# Patient Record
Sex: Female | Born: 1935 | Race: White | Hispanic: No | Marital: Married | Smoking: Never smoker
Health system: Southern US, Community
[De-identification: ages and names within clinical notes are randomized; demographics above are authoritative.]

## PROBLEM LIST (undated history)

## (undated) DIAGNOSIS — E871 Hypo-osmolality and hyponatremia: Secondary | ICD-10-CM

## (undated) DIAGNOSIS — Z7901 Long term (current) use of anticoagulants: Secondary | ICD-10-CM

## (undated) DIAGNOSIS — I10 Essential (primary) hypertension: Secondary | ICD-10-CM

## (undated) DIAGNOSIS — I340 Nonrheumatic mitral (valve) insufficiency: Secondary | ICD-10-CM

## (undated) DIAGNOSIS — D689 Coagulation defect, unspecified: Secondary | ICD-10-CM

## (undated) DIAGNOSIS — I351 Nonrheumatic aortic (valve) insufficiency: Secondary | ICD-10-CM

## (undated) DIAGNOSIS — R6510 Systemic inflammatory response syndrome (SIRS) of non-infectious origin without acute organ dysfunction: Secondary | ICD-10-CM

## (undated) DIAGNOSIS — I82409 Acute embolism and thrombosis of unspecified deep veins of unspecified lower extremity: Secondary | ICD-10-CM

## (undated) DIAGNOSIS — D72829 Elevated white blood cell count, unspecified: Secondary | ICD-10-CM

## (undated) DIAGNOSIS — R011 Cardiac murmur, unspecified: Secondary | ICD-10-CM

## (undated) DIAGNOSIS — J479 Bronchiectasis, uncomplicated: Secondary | ICD-10-CM

## (undated) DIAGNOSIS — M7551 Bursitis of right shoulder: Secondary | ICD-10-CM

## (undated) HISTORY — DX: Cardiac murmur, unspecified: R01.1

## (undated) HISTORY — DX: Essential (primary) hypertension: I10

## (undated) HISTORY — DX: Long term (current) use of anticoagulants: Z79.01

## (undated) HISTORY — PX: THYROIDECTOMY, PARTIAL: SHX18

## (undated) HISTORY — DX: Elevated white blood cell count, unspecified: D72.829

## (undated) HISTORY — DX: Systemic inflammatory response syndrome (sirs) of non-infectious origin without acute organ dysfunction: R65.10

## (undated) HISTORY — DX: Hypo-osmolality and hyponatremia: E87.1

## (undated) HISTORY — PX: TONSILLECTOMY: SUR1361

---

## 2013-12-23 ENCOUNTER — Inpatient Hospital Stay (HOSPITAL_COMMUNITY)
Admission: EM | Admit: 2013-12-23 | Discharge: 2013-12-26 | DRG: 481 | Disposition: A | Discharge status: Rehabilitation Facility | Payer: Medicare Other | Attending: Family Medicine | Admitting: Family Medicine

## 2013-12-23 ENCOUNTER — Emergency Department (HOSPITAL_COMMUNITY): Payer: Medicare Other

## 2013-12-23 ENCOUNTER — Encounter (HOSPITAL_COMMUNITY): Payer: Self-pay | Admitting: Emergency Medicine

## 2013-12-23 DIAGNOSIS — D72829 Elevated white blood cell count, unspecified: Secondary | ICD-10-CM | POA: Diagnosis not present

## 2013-12-23 DIAGNOSIS — S72143A Displaced intertrochanteric fracture of unspecified femur, initial encounter for closed fracture: Principal | ICD-10-CM

## 2013-12-23 DIAGNOSIS — S7223XA Displaced subtrochanteric fracture of unspecified femur, initial encounter for closed fracture: Secondary | ICD-10-CM | POA: Diagnosis present

## 2013-12-23 DIAGNOSIS — Z79899 Other long term (current) drug therapy: Secondary | ICD-10-CM

## 2013-12-23 DIAGNOSIS — W19XXXA Unspecified fall, initial encounter: Secondary | ICD-10-CM | POA: Diagnosis present

## 2013-12-23 DIAGNOSIS — S72009A Fracture of unspecified part of neck of unspecified femur, initial encounter for closed fracture: Secondary | ICD-10-CM | POA: Diagnosis present

## 2013-12-23 DIAGNOSIS — S72142A Displaced intertrochanteric fracture of left femur, initial encounter for closed fracture: Secondary | ICD-10-CM

## 2013-12-23 DIAGNOSIS — J479 Bronchiectasis, uncomplicated: Secondary | ICD-10-CM | POA: Diagnosis present

## 2013-12-23 DIAGNOSIS — D62 Acute posthemorrhagic anemia: Secondary | ICD-10-CM | POA: Diagnosis not present

## 2013-12-23 DIAGNOSIS — I809 Phlebitis and thrombophlebitis of unspecified site: Secondary | ICD-10-CM | POA: Diagnosis present

## 2013-12-23 DIAGNOSIS — Z86718 Personal history of other venous thrombosis and embolism: Secondary | ICD-10-CM

## 2013-12-23 DIAGNOSIS — Z7901 Long term (current) use of anticoagulants: Secondary | ICD-10-CM

## 2013-12-23 DIAGNOSIS — Z885 Allergy status to narcotic agent status: Secondary | ICD-10-CM

## 2013-12-23 DIAGNOSIS — I82509 Chronic embolism and thrombosis of unspecified deep veins of unspecified lower extremity: Secondary | ICD-10-CM | POA: Diagnosis present

## 2013-12-23 DIAGNOSIS — I10 Essential (primary) hypertension: Secondary | ICD-10-CM | POA: Diagnosis present

## 2013-12-23 LAB — CBC WITH DIFFERENTIAL/PLATELET
Basophils Absolute: 0 10*3/uL (ref 0.0–0.1)
Basophils Relative: 0 % (ref 0–1)
EOS ABS: 0.1 10*3/uL (ref 0.0–0.7)
Eosinophils Relative: 1 % (ref 0–5)
HCT: 40.2 % (ref 36.0–46.0)
Hemoglobin: 13.5 g/dL (ref 12.0–15.0)
LYMPHS ABS: 3.1 10*3/uL (ref 0.7–4.0)
Lymphocytes Relative: 35 % (ref 12–46)
MCH: 29.3 pg (ref 26.0–34.0)
MCHC: 33.6 g/dL (ref 30.0–36.0)
MCV: 87.4 fL (ref 78.0–100.0)
Monocytes Absolute: 0.8 10*3/uL (ref 0.1–1.0)
Monocytes Relative: 9 % (ref 3–12)
Neutro Abs: 5 10*3/uL (ref 1.7–7.7)
Neutrophils Relative %: 56 % (ref 43–77)
Platelets: 222 10*3/uL (ref 150–400)
RBC: 4.6 MIL/uL (ref 3.87–5.11)
RDW: 14.1 % (ref 11.5–15.5)
WBC: 8.9 10*3/uL (ref 4.0–10.5)

## 2013-12-23 LAB — BASIC METABOLIC PANEL
BUN: 20 mg/dL (ref 6–23)
CALCIUM: 9.1 mg/dL (ref 8.4–10.5)
CO2: 24 meq/L (ref 19–32)
CREATININE: 0.89 mg/dL (ref 0.50–1.10)
Chloride: 98 mEq/L (ref 96–112)
GFR calc Af Amer: 69 mL/min — ABNORMAL LOW (ref >90)
GFR calc non Af Amer: 60 mL/min — ABNORMAL LOW (ref >90)
GLUCOSE: 129 mg/dL — AB (ref 70–99)
Potassium: 3.8 mEq/L (ref 3.7–5.3)
Sodium: 138 mEq/L (ref 137–147)

## 2013-12-23 LAB — URINALYSIS, ROUTINE W REFLEX MICROSCOPIC
BILIRUBIN URINE: NEGATIVE
Glucose, UA: NEGATIVE mg/dL
Hgb urine dipstick: NEGATIVE
Ketones, ur: 15 mg/dL — AB
LEUKOCYTES UA: NEGATIVE
Nitrite: NEGATIVE
Protein, ur: NEGATIVE mg/dL
Specific Gravity, Urine: 1.009 (ref 1.005–1.030)
Urobilinogen, UA: 0.2 mg/dL (ref 0.0–1.0)
pH: 7.5 (ref 5.0–8.0)

## 2013-12-23 LAB — PROTIME-INR
INR: 1.82 — ABNORMAL HIGH (ref 0.00–1.49)
PROTHROMBIN TIME: 20.5 s — AB (ref 11.6–15.2)

## 2013-12-23 LAB — ABO/RH: ABO/RH(D): O POS

## 2013-12-23 LAB — TYPE AND SCREEN
ABO/RH(D): O POS
Antibody Screen: NEGATIVE

## 2013-12-23 MED ORDER — SENNOSIDES-DOCUSATE SODIUM 8.6-50 MG PO TABS: 1.0000 | ORAL_TABLET | Freq: Every evening | ORAL | Status: DC | PRN

## 2013-12-23 MED ORDER — PROMETHAZINE HCL 25 MG/ML IJ SOLN
12.5000 mg | Freq: Once | INTRAMUSCULAR | Status: AC
  Administered 2013-12-23: 12.5 mg via INTRAVENOUS
  Filled 2013-12-23: qty 1

## 2013-12-23 MED ORDER — FENTANYL CITRATE 0.05 MG/ML IJ SOLN
50.0000 ug | INTRAMUSCULAR | Status: AC | PRN
  Administered 2013-12-23 (×2): 50 ug via INTRAVENOUS
  Filled 2013-12-23 (×2): qty 2

## 2013-12-23 MED ORDER — LISINOPRIL 5 MG PO TABS
5.0000 mg | ORAL_TABLET | Freq: Two times a day (BID) | ORAL | Status: DC
  Administered 2013-12-23 – 2013-12-24 (×2): 5 mg via ORAL
  Filled 2013-12-23 (×5): qty 1

## 2013-12-23 MED ORDER — ALUM & MAG HYDROXIDE-SIMETH 200-200-20 MG/5ML PO SUSP
30.0000 mL | Freq: Four times a day (QID) | ORAL | Status: DC | PRN
Start: 2013-12-23 — End: 2013-12-26

## 2013-12-23 MED ORDER — ONDANSETRON HCL 4 MG/2ML IJ SOLN
4.0000 mg | Freq: Once | INTRAMUSCULAR | Status: AC
  Administered 2013-12-23: 4 mg via INTRAVENOUS
  Filled 2013-12-23: qty 2

## 2013-12-23 MED ORDER — DEXTROSE-NACL 5-0.9 % IV SOLN
INTRAVENOUS | Status: AC
  Administered 2013-12-23 – 2013-12-25 (×3): via INTRAVENOUS

## 2013-12-23 MED ORDER — PHYTONADIONE 5 MG PO TABS
5.0000 mg | ORAL_TABLET | Freq: Once | ORAL | Status: AC
  Administered 2013-12-23: 5 mg via ORAL
  Filled 2013-12-23: qty 1

## 2013-12-23 MED ORDER — GUAIFENESIN-DM 100-10 MG/5ML PO SYRP: 5.0000 mL | ORAL_SOLUTION | ORAL | Status: DC | PRN

## 2013-12-23 MED ORDER — FLUTICASONE PROPIONATE 50 MCG/ACT NA SUSP
1.0000 | Freq: Every day | NASAL | Status: DC
  Administered 2013-12-24 – 2013-12-26 (×3): 1 via NASAL
  Filled 2013-12-23: qty 16

## 2013-12-23 MED ORDER — MORPHINE SULFATE 2 MG/ML IJ SOLN
2.0000 mg | INTRAMUSCULAR | Status: DC | PRN
  Administered 2013-12-24 (×2): 2 mg via INTRAVENOUS
  Filled 2013-12-23 (×2): qty 1

## 2013-12-23 MED ORDER — ADULT MULTIVITAMIN W/MINERALS CH
1.0000 | ORAL_TABLET | Freq: Every day | ORAL | Status: DC
  Administered 2013-12-25 – 2013-12-26 (×2): 1 via ORAL
  Filled 2013-12-23 (×3): qty 1

## 2013-12-23 MED ORDER — ONDANSETRON HCL 4 MG PO TABS: 4.0000 mg | ORAL_TABLET | Freq: Four times a day (QID) | ORAL | Status: DC | PRN

## 2013-12-23 MED ORDER — HYDROCODONE-ACETAMINOPHEN 5-325 MG PO TABS
1.0000 | ORAL_TABLET | ORAL | Status: DC | PRN
  Administered 2013-12-25 (×3): 1 via ORAL
  Administered 2013-12-26: 2 via ORAL
  Administered 2013-12-26 (×2): 1 via ORAL
  Filled 2013-12-23 (×5): qty 1

## 2013-12-23 MED ORDER — ACETAMINOPHEN 325 MG PO TABS: 650.0000 mg | ORAL_TABLET | Freq: Four times a day (QID) | ORAL | Status: DC | PRN

## 2013-12-23 MED ORDER — ONDANSETRON HCL 4 MG/2ML IJ SOLN: 4.0000 mg | Freq: Four times a day (QID) | INTRAMUSCULAR | Status: DC | PRN

## 2013-12-23 MED ORDER — AZELASTINE HCL 0.1 % NA SOLN
2.0000 | Freq: Two times a day (BID) | NASAL | Status: DC | PRN
  Filled 2013-12-23: qty 30

## 2013-12-23 MED ORDER — ALBUTEROL SULFATE (2.5 MG/3ML) 0.083% IN NEBU: 2.5000 mg | INHALATION_SOLUTION | RESPIRATORY_TRACT | Status: DC | PRN

## 2013-12-23 MED ORDER — CALCIUM CARBONATE 1250 (500 CA) MG PO TABS
1.0000 | ORAL_TABLET | Freq: Every day | ORAL | Status: DC
  Administered 2013-12-25 – 2013-12-26 (×2): 500 mg via ORAL
  Filled 2013-12-23 (×4): qty 1

## 2013-12-23 MED ORDER — ACETAMINOPHEN 650 MG RE SUPP: 650.0000 mg | Freq: Four times a day (QID) | RECTAL | Status: DC | PRN

## 2013-12-23 NOTE — ED Notes (Signed)
Bed: WA01 Expected date:  Expected time:  Means of arrival:  Comments: fall 

## 2013-12-23 NOTE — ED Provider Notes (Signed)
CSN: [id number]     Arrival date & time 12/23/13  1600 History   First MD Initiated Contact with Patient 12/23/13 [id number]     Chief Complaint  Patient presents with  . Fall     (Consider location/radiation/quality/duration/timing/severity/associated sxs/prior Treatment) Patient is a 80 y.o. female presenting with fall. The history is provided by the patient.  Fall This is a new problem. The current episode started less than 1 hour ago. The problem occurs constantly. The problem has not changed since onset.Pertinent negatives include no chest pain, no abdominal pain, no headaches and no shortness of breath. Associated symptoms comments: Left hip pain and deformity.  Since fall she has had n/v due to pain.  Pt denies head injury or LOC.  nO neck pain.. The symptoms are aggravated by bending and twisting. The symptoms are relieved by narcotics. Treatments tried: fentanyl. The treatment provided moderate relief.    Past Medical History  Diagnosis Date  . Hypertension    No past surgical history on file. No family history on file. History  Substance Use Topics  . Smoking status: Not on file  . Smokeless tobacco: Not on file  . Alcohol Use: Not on file   OB History   Grav Para Term Preterm Abortions TAB SAB Ect Mult Living                 Review of Systems  Respiratory: Negative for shortness of breath.   Cardiovascular: Negative for chest pain.  Gastrointestinal: Positive for nausea and vomiting. Negative for abdominal pain.  Neurological: Negative for headaches.  All other systems reviewed and are negative.     Allergies  Augmentin; Macrolides and ketolides; and Zithromax  Home Medications   Prior to Admission medications   Not on File   There were no vitals taken for this visit. Physical Exam  Nursing note and vitals reviewed. Constitutional: She is oriented to person, place, and time. She appears well-developed and well-nourished. No distress.  HENT:  Head:  Normocephalic and atraumatic.  Mouth/Throat: Oropharynx is clear and moist.  Eyes: Conjunctivae and EOM are normal. Pupils are equal, round, and reactive to light.  Neck: Normal range of motion. Neck supple.  Cardiovascular: Regular rhythm and intact distal pulses.  Tachycardia present.   No murmur heard. Pulmonary/Chest: Effort normal and breath sounds normal. No respiratory distress. She has no wheezes. She has no rales.  Abdominal: Soft. She exhibits no distension. There is no tenderness. There is no rebound and no guarding.  Musculoskeletal: She exhibits no edema.       Left hip: She exhibits decreased range of motion, tenderness, bony tenderness and deformity.  Left lower ext is rotated and shortened  Neurological: She is alert and oriented to person, place, and time.  Skin: Skin is warm and dry. No rash noted. No erythema. There is pallor.  Psychiatric: She has a normal mood and affect. Her behavior is normal.    ED Course  Procedures (including critical care time) Labs Review Labs Reviewed  BASIC METABOLIC PANEL - Abnormal; Notable for the following:    Glucose, Bld 129 (*)    GFR calc non Af Amer 60 (*)    GFR calc Af Amer 69 (*)    All other components within normal limits  PROTIME-INR - Abnormal; Notable for the following:    Prothrombin Time 20.5 (*)    INR 1.82 (*)    All other components within normal limits  CBC WITH DIFFERENTIAL  URINALYSIS, ROUTINE W  REFLEX MICROSCOPIC  TYPE AND SCREEN  ABO/RH    Imaging Review Dg Chest 1 View  12/23/2013   CLINICAL DATA:  Pre-op respiratory exam for left hip fracture.  EXAM: CHEST - 1 VIEW  COMPARISON:  None.  FINDINGS: The heart size and mediastinal contours are within normal limits. No evidence of pulmonary infiltrate or pleural effusion. Thoracic dextroscoliosis noted.  IMPRESSION: No acute findings.   Electronically Signed   By: Earle Gell M.D.   On: 12/23/2013 17:06   Dg Hip Complete Left  12/23/2013   CLINICAL DATA:   [place], pain  EXAM: LEFT HIP - COMPLETE 2+ VIEW  COMPARISON:  None.  FINDINGS: There is a comminuted intertrochanteric fracture of the left hip which extends into the proximal femur. Significant medial angulation. No hip dislocation. No pelvic fracture. Soft tissue swelling. Vascular calcification.  IMPRESSION: Comminuted intertrochanteric fracture, left hip.   Electronically Signed   By: Rolla Flatten M.D.   On: 12/23/2013 17:04     EKG Interpretation   Date/Time:  Saturday December 23 2013 17:25:43 EDT Ventricular Rate:  101 PR Interval:    QRS Duration: 82 QT Interval:  425 QTC Calculation: 551 R Axis:   4 Text Interpretation:  Atrial fibrillation Low voltage, precordial leads  Nonspecific T abnormalities, lateral leads Prolonged QT interval No  previous tracing Confirmed by Maryan Rued  MD, Loree Fee ([id number]) on 12/23/2013  5:40:31 PM      MDM   Final diagnoses:  Intertrochanteric fracture of left hip   Patient with a mechanical fall today on the left hip with deformity of the hip and inability to walk. She denies any injury to her head or LOC. She is neurovascularly intact no other injuries noted at this time. 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  Pt denies any SOB but she last ate at 13:30 and has been vomiting since fall due to the pain.  She is awake alert and oriented.   Hip fx protocol started.  5:11 PM Imaging shows comminuted intertrochanteric fx of the left hip.  InR is 1.82.  CXR and EKG are stable.  Discussed with Dr. Ninfa Linden with ortho and will admit to hospitalist.  Pt placed in buck's traction.  Will not go to OR till tomorrow.  Blanchie Dessert, MD 12/23/13 1754

## 2013-12-23 NOTE — ED Notes (Addendum)
Pt had fall hitting left hip on concrete patio, shortening rotation noted to left side. 150 mcg of fentanyl 20g right forearm and 4 mg zofran. Pt visitor from [place].

## 2013-12-23 NOTE — ED Notes (Signed)
Admitting MD at bedside.

## 2013-12-23 NOTE — Consult Note (Signed)
Reason for Consult:  Left hip fracture Referring Physician: EDP  Vanessa Page is an 80 y.o. female.  HPI:   80 yo female who injured her left hip today after a mechanical fall.  Was transported to the ED and found to have a complex left hip fracture.  Orthopedics is consulted to address this.  She denies any syncopal episode and states that she was playing catch with her grandchild when she accidentally tripped falling directly on her left hip.  She reports only left hip pain and denies other injuries.  Past Medical History  Diagnosis Date  . Hypertension     No past surgical history on file.  No family history on file.  Social History:  has no tobacco, alcohol, and drug history on file.  Allergies:  Allergies  Allergen Reactions  . Codeine     Medications: I have reviewed the patient's current medications.  Results for orders placed during the hospital encounter of 12/23/13 (from the past 48 hour(s))  BASIC METABOLIC PANEL     Status: Abnormal   Collection Time    12/23/13  4:20 PM      Result Value Ref Range   Sodium 138  137 - 147 mEq/L   Potassium 3.8  3.7 - 5.3 mEq/L   Chloride 98  96 - 112 mEq/L   CO2 24  19 - 32 mEq/L   Glucose, Bld 129 (*) 70 - 99 mg/dL   BUN 20  6 - 23 mg/dL   Creatinine, Ser 0.89  0.50 - 1.10 mg/dL   Calcium 9.1  8.4 - 10.5 mg/dL   GFR calc non Af Amer 60 (*) >90 mL/min   GFR calc Af Amer 69 (*) >90 mL/min   Comment: (NOTE)     The eGFR has been calculated using the CKD EPI equation.     This calculation has not been validated in all clinical situations.     eGFR's persistently <90 mL/min signify possible Chronic Kidney     Disease.  CBC WITH DIFFERENTIAL     Status: None   Collection Time    12/23/13  4:20 PM      Result Value Ref Range   WBC 8.9  4.0 - 10.5 K/uL   RBC 4.60  3.87 - 5.11 MIL/uL   Hemoglobin 13.5  12.0 - 15.0 g/dL   HCT 40.2  36.0 - 46.0 %   MCV 87.4  78.0 - 100.0 fL   MCH 29.3  26.0 - 34.0 pg   MCHC 33.6  30.0 -  36.0 g/dL   RDW 14.1  11.5 - 15.5 %   Platelets 222  150 - 400 K/uL   Neutrophils Relative % 56  43 - 77 %   Neutro Abs 5.0  1.7 - 7.7 K/uL   Lymphocytes Relative 35  12 - 46 %   Lymphs Abs 3.1  0.7 - 4.0 K/uL   Monocytes Relative 9  3 - 12 %   Monocytes Absolute 0.8  0.1 - 1.0 K/uL   Eosinophils Relative 1  0 - 5 %   Eosinophils Absolute 0.1  0.0 - 0.7 K/uL   Basophils Relative 0  0 - 1 %   Basophils Absolute 0.0  0.0 - 0.1 K/uL  PROTIME-INR     Status: Abnormal   Collection Time    12/23/13  4:20 PM      Result Value Ref Range   Prothrombin Time 20.5 (*) 11.6 - 15.2 seconds   INR 1.82 (*)  0.00 - 1.49  TYPE AND SCREEN     Status: None   Collection Time    12/23/13  4:20 PM      Result Value Ref Range   ABO/RH(D) O POS     Antibody Screen NEG     Sample Expiration 12/26/2013    ABO/RH     Status: None   Collection Time    12/23/13  4:20 PM      Result Value Ref Range   ABO/RH(D) O POS    URINALYSIS, ROUTINE W REFLEX MICROSCOPIC     Status: Abnormal   Collection Time    12/23/13  7:13 PM      Result Value Ref Range   Color, Urine YELLOW  YELLOW   APPearance CLOUDY (*) CLEAR   Specific Gravity, Urine 1.009  1.005 - 1.030   pH 7.5  5.0 - 8.0   Glucose, UA NEGATIVE  NEGATIVE mg/dL   Hgb urine dipstick NEGATIVE  NEGATIVE   Bilirubin Urine NEGATIVE  NEGATIVE   Ketones, ur 15 (*) NEGATIVE mg/dL   Protein, ur NEGATIVE  NEGATIVE mg/dL   Urobilinogen, UA 0.2  0.0 - 1.0 mg/dL   Nitrite NEGATIVE  NEGATIVE   Leukocytes, UA NEGATIVE  NEGATIVE   Comment: MICROSCOPIC NOT DONE ON URINES WITH NEGATIVE PROTEIN, BLOOD, LEUKOCYTES, NITRITE, OR GLUCOSE <1000 mg/dL.    Dg Chest 1 View  12/23/2013   CLINICAL DATA:  Pre-op respiratory exam for left hip fracture.  EXAM: CHEST - 1 VIEW  COMPARISON:  None.  FINDINGS: The heart size and mediastinal contours are within normal limits. No evidence of pulmonary infiltrate or pleural effusion. Thoracic dextroscoliosis noted.  IMPRESSION: No acute  findings.   Electronically Signed   By: John  Stahl M.D.   On: 12/23/2013 17:06   Dg Hip Complete Left  12/23/2013   CLINICAL DATA:  Fell, pain  EXAM: LEFT HIP - COMPLETE 2+ VIEW  COMPARISON:  None.  FINDINGS: There is a comminuted intertrochanteric fracture of the left hip which extends into the proximal femur. Significant medial angulation. No hip dislocation. No pelvic fracture. Soft tissue swelling. Vascular calcification.  IMPRESSION: Comminuted intertrochanteric fracture, left hip.   Electronically Signed   By: John  Curnes M.D.   On: 12/23/2013 17:04    Review of Systems  All other systems reviewed and are negative.  Blood pressure 139/73, pulse 104, temperature 97.7 F (36.5 C), temperature source Oral, resp. rate 16, SpO2 95.00%. Physical Exam  Musculoskeletal:       Left hip: She exhibits decreased range of motion, decreased strength, bony tenderness and deformity.    Assessment/Plan: Comminuted intertrochanteric femur fracture with subtrochanteric extension 1)  I spoke to her in length as well as her family.  They understand the need for surgery and I explained in great detail the surgical approach as well as the risks and benefits involved.  We will proceed to the OR in the am.  Christopher Y Blackman 12/23/2013, 10:38 PM      

## 2013-12-23 NOTE — H&P (Signed)
Patient Demographics  Vanessa Page, is a 80 y.o. female  MRN: [id number]   DOB - 1931-06-21  Admit Date - 12/23/2013  Outpatient Primary MD for the patient is No primary provider on file.     Assessment Vanessa Page is a pleasant 80 year old female with a history of DVT on Coumadin/bronchiectasis/essential hypertension comes in after she fell while playing with her grand kids, resulting in a left intertrochanteric fracture. She had otherwise been in good health with no history of fixational angina or dyspnea. INR is 1.82 today. She last took Coumadin yesterday. Dr. Shanon Brow from orthopedics was consulted by Dr Maryan Rued  and will see patient in consult with tentative plans for surgery in a.m. Will let Coumadin wear off but if necessary give FFP in a.m. if INR remains above 1.5. Will also obtain 2-D echocardiogram to assess ejection fraction but this should not delay these image and 3 required surgery was potential benefits outweigh the risks in a patient with moderate risk profile for an average risk procedure. Plan Intertrochanteric fracture of left hip  Admit MedSurg  Defer management to orthopedics  N.p.o. after midnight  Pain control  Will need physical therapy evaluation postoperatively Chronic anticoagulation/History of DVT of lower  extremity  Hold Coumadin  Consider FFP transfusion if INR remains above 1.5 in a.m. Bronchiectasis  Stable  Bronchodilators and oxygen supplementation for now Essential hypertension  BP borderline  Resume home medications with parameters DVT Prophylaxis    SCDs   AM Labs Ordered, also please review Full Orders  Family Communication: Admission, patients condition and plan of care including tests being ordered have been discussed with the patient and husband who indicate understanding and agree with the plan and Code Status.  Code Status Full Code  Likely DC to  ?SNF  Condition GUARDED     Chief Complaint  Patient  presents with  . Fall     HPI  Vanessa Page  is a 80 y.o. female, who comes in after she fell while playing with her grandkids. She had otherwise been in good health and just tripped while playing ball game. She is here visiting from [facility]. She denies shortness of breath, chest pain, fever, nausea, vomiting, diarrhea or dysuria.  Review of Systems    In addition to the HPI above,  No Fever-chills, No Headache, No changes with Vision or hearing, No problems swallowing food or Liquids, No Chest pain, Cough or Shortness of Breath, No Abdominal pain, No Nausea or Vommitting, Bowel movements are regular, No Blood in stool or Urine, No dysuria, No new skin rashes or bruises, No new joints pains-aches,  No new weakness, tingling, numbness in any extremity, No recent weight gain or loss, No polyuria, polydypsia or polyphagia, No significant Mental Stressors.  A full 10 point Review of Systems was done, except as stated above, all other Review of Systems were negative.   Social History History  Substance Use Topics  . Smoking status: Not on file  . Smokeless tobacco: Not on file  . Alcohol Use: Not on file    Family History Negative.  Prior to Admission medications   Medication Sig Start Date End Date Taking? Authorizing Provider  albuterol (PROVENTIL HFA;VENTOLIN HFA) 108 (90 BASE) MCG/ACT inhaler Inhale 1 puff into the lungs every 6 (six) hours as needed for wheezing or shortness of breath.   Yes Historical Provider, MD  alendronate (FOSAMAX) 70 MG tablet Take 70 mg by mouth once a week. Take  with a full glass of water on an empty stomach.   Yes Historical Provider, MD  azelastine (ASTELIN) 137 MCG/SPRAY nasal spray Place 2 sprays into both nostrils 2 (two) times daily as needed for allergies. Use in each nostril as directed   Yes Historical Provider, MD  CALCIUM CARBONATE PO Take 1 tablet by mouth daily.   Yes Historical Provider, MD  conjugated estrogens  (PREMARIN) vaginal cream Place 1 Applicatorful vaginally 3 (three) times a week.   Yes Historical Provider, MD  fluticasone (FLONASE) 50 MCG/ACT nasal spray Place into both nostrils daily.   Yes Historical Provider, MD  lisinopril (PRINIVIL,ZESTRIL) 5 MG tablet Take 5 mg by mouth 2 (two) times daily.   Yes Historical Provider, MD  warfarin (COUMADIN) 2 MG tablet Take 1-2 mg by mouth daily. Takes 1 mg(1/2 tablet) on Monday and Friday and 2 mg (1 tablet) the rest of the week.   Yes Historical Provider, MD    Allergies  Allergen Reactions  . Codeine     Physical Exam  Vitals  Blood pressure 139/73, pulse 104, temperature 97.7 F (36.5 C), temperature source Oral, resp. rate 16, SpO2 95.00%.   1. General  lying in bed in NAD,    2. Normal affect and insight, Not Suicidal or Homicidal, Awake Alert, Oriented X 3.  3. No F.N deficits, ALL C.Nerves Intact, Strength 5/5 all 4 extremities, Sensation intact all 4 extremities, Plantars down going.  4. Ears and Eyes appear Normal, Conjunctivae clear, PERRLA. Moist Oral Mucosa.  5. Supple Neck, No JVD, No cervical lymphadenopathy appriciated, No Carotid Bruits.  6. Symmetrical Chest wall movement, Good air movement bilaterally, CTAB.  7. RRR, No Gallops, Rubs or Murmurs, No Parasternal Heave.  8. Positive Bowel Sounds, Abdomen Soft, Non tender, No organomegaly appriciated,No rebound -guarding or rigidity.  9.  No Cyanosis, Normal Skin Turgor, No Skin Rash or Bruise.  10. Left leg flexed with restricted range of motion.  11. No Palpable Lymph Nodes in Neck or Axillae   Data Review  CBC  Recent Labs Lab 12/23/13 1620  WBC 8.9  HGB 13.5  HCT 40.2  PLT 222  MCV 87.4  MCH 29.3  MCHC 33.6  RDW 14.1  LYMPHSABS 3.1  MONOABS 0.8  EOSABS 0.1  BASOSABS 0.0   ------------------------------------------------------------------------------------------------------------------  Chemistries   Recent Labs Lab 12/23/13 1620  NA 138   K 3.8  CL 98  CO2 24  GLUCOSE 129*  BUN 20  CREATININE 0.89  CALCIUM 9.1   ------------------------------------------------------------------------------------------------------------------ CrCl is unknown because there is no height on file for the current visit. ------------------------------------------------------------------------------------------------------------------ No results found for this basename: TSH, T4TOTAL, FREET3, T3FREE, THYROIDAB,  in the last 72 hours   Coagulation profile  Recent Labs Lab 12/23/13 1620  INR 1.82*   ------------------------------------------------------------------------------------------------------------------- No results found for this basename: DDIMER,  in the last 72 hours -------------------------------------------------------------------------------------------------------------------  Cardiac Enzymes No results found for this basename: CK, CKMB, TROPONINI, MYOGLOBIN,  in the last 168 hours ------------------------------------------------------------------------------------------------------------------ No components found with this basename: POCBNP,    ---------------------------------------------------------------------------------------------------------------  Urinalysis No results found for this basename: colorurine, appearanceur, labspec, phurine, glucoseu, hgbur, bilirubinur, ketonesur, proteinur, urobilinogen, nitrite, leukocytesur    ----------------------------------------------------------------------------------------------------------------  Imaging results:   Dg Chest 1 View  12/23/2013   CLINICAL DATA:  Pre-op respiratory exam for left hip fracture.  EXAM: CHEST - 1 VIEW  COMPARISON:  None.  FINDINGS: The heart size and mediastinal contours are within normal limits. No evidence of pulmonary infiltrate or  pleural effusion. Thoracic dextroscoliosis noted.  IMPRESSION: No acute findings.   Electronically Signed    By: Earle Gell M.D.   On: 12/23/2013 17:06   Dg Hip Complete Left  12/23/2013   CLINICAL DATA:  [place], pain  EXAM: LEFT HIP - COMPLETE 2+ VIEW  COMPARISON:  None.  FINDINGS: There is a comminuted intertrochanteric fracture of the left hip which extends into the proximal femur. Significant medial angulation. No hip dislocation. No pelvic fracture. Soft tissue swelling. Vascular calcification.  IMPRESSION: Comminuted intertrochanteric fracture, left hip.   Electronically Signed   By: Rolla Flatten M.D.   On: 12/23/2013 17:04        Nat Math M.D on 12/23/2013 at 7:21 PM  Between 7am to 7pm - Pager - [phone].  After 7pm go to [email or web address] - password [email or web address]  And look for the night coverage person covering me after hours  [facility] Office  [phone]

## 2013-12-24 ENCOUNTER — Encounter (HOSPITAL_COMMUNITY): Payer: Self-pay | Admitting: *Deleted

## 2013-12-24 ENCOUNTER — Inpatient Hospital Stay (HOSPITAL_COMMUNITY): Payer: Medicare Other

## 2013-12-24 ENCOUNTER — Encounter (HOSPITAL_COMMUNITY): Payer: Medicare Other | Admitting: Certified Registered Nurse Anesthetist

## 2013-12-24 ENCOUNTER — Inpatient Hospital Stay (HOSPITAL_COMMUNITY): Payer: Medicare Other | Admitting: Certified Registered Nurse Anesthetist

## 2013-12-24 ENCOUNTER — Encounter (HOSPITAL_COMMUNITY)
Admission: EM | Disposition: A | Discharge status: Rehabilitation Facility | Payer: Self-pay | Source: Home / Self Care | Attending: Family Medicine

## 2013-12-24 DIAGNOSIS — I351 Nonrheumatic aortic (valve) insufficiency: Secondary | ICD-10-CM

## 2013-12-24 DIAGNOSIS — I340 Nonrheumatic mitral (valve) insufficiency: Secondary | ICD-10-CM

## 2013-12-24 DIAGNOSIS — I359 Nonrheumatic aortic valve disorder, unspecified: Secondary | ICD-10-CM

## 2013-12-24 HISTORY — PX: INTRAMEDULLARY (IM) NAIL INTERTROCHANTERIC: SHX5875

## 2013-12-24 HISTORY — DX: Nonrheumatic mitral (valve) insufficiency: I34.0

## 2013-12-24 HISTORY — DX: Nonrheumatic aortic (valve) insufficiency: I35.1

## 2013-12-24 LAB — COMPREHENSIVE METABOLIC PANEL
ALK PHOS: 57 U/L (ref 39–117)
ALT: 17 U/L (ref 0–35)
AST: 28 U/L (ref 0–37)
Albumin: 3.2 g/dL — ABNORMAL LOW (ref 3.5–5.2)
BILIRUBIN TOTAL: 0.6 mg/dL (ref 0.3–1.2)
BUN: 15 mg/dL (ref 6–23)
CHLORIDE: 100 meq/L (ref 96–112)
CO2: 25 mEq/L (ref 19–32)
Calcium: 8.2 mg/dL — ABNORMAL LOW (ref 8.4–10.5)
Creatinine, Ser: 0.77 mg/dL (ref 0.50–1.10)
GFR calc non Af Amer: 77 mL/min — ABNORMAL LOW (ref >90)
GFR, EST AFRICAN AMERICAN: 90 mL/min — AB (ref >90)
GLUCOSE: 151 mg/dL — AB (ref 70–99)
POTASSIUM: 4.5 meq/L (ref 3.7–5.3)
Sodium: 136 mEq/L — ABNORMAL LOW (ref 137–147)
Total Protein: 6.2 g/dL (ref 6.0–8.3)

## 2013-12-24 LAB — PROTIME-INR
INR: 1.96 — ABNORMAL HIGH (ref 0.00–1.49)
Prothrombin Time: 21.7 seconds — ABNORMAL HIGH (ref 11.6–15.2)

## 2013-12-24 LAB — CBC WITH DIFFERENTIAL/PLATELET
Basophils Absolute: 0 10*3/uL (ref 0.0–0.1)
Basophils Relative: 0 % (ref 0–1)
EOS PCT: 0 % (ref 0–5)
Eosinophils Absolute: 0 10*3/uL (ref 0.0–0.7)
HCT: 35.4 % — ABNORMAL LOW (ref 36.0–46.0)
Hemoglobin: 11.9 g/dL — ABNORMAL LOW (ref 12.0–15.0)
Lymphocytes Relative: 8 % — ABNORMAL LOW (ref 12–46)
Lymphs Abs: 1.2 10*3/uL (ref 0.7–4.0)
MCH: 29.5 pg (ref 26.0–34.0)
MCHC: 33.6 g/dL (ref 30.0–36.0)
MCV: 87.8 fL (ref 78.0–100.0)
MONOS PCT: 8 % (ref 3–12)
Monocytes Absolute: 1.2 10*3/uL — ABNORMAL HIGH (ref 0.1–1.0)
Neutro Abs: 13.5 10*3/uL — ABNORMAL HIGH (ref 1.7–7.7)
Neutrophils Relative %: 84 % — ABNORMAL HIGH (ref 43–77)
PLATELETS: 198 10*3/uL (ref 150–400)
RBC: 4.03 MIL/uL (ref 3.87–5.11)
RDW: 14.2 % (ref 11.5–15.5)
WBC: 16 10*3/uL — ABNORMAL HIGH (ref 4.0–10.5)

## 2013-12-24 LAB — TSH: TSH: 0.821 u[IU]/mL (ref 0.350–4.500)

## 2013-12-24 LAB — SURGICAL PCR SCREEN
MRSA, PCR: NEGATIVE
Staphylococcus aureus: POSITIVE — AB

## 2013-12-24 LAB — PHOSPHORUS: Phosphorus: 3.3 mg/dL (ref 2.3–4.6)

## 2013-12-24 LAB — APTT: aPTT: 35 seconds (ref 24–37)

## 2013-12-24 LAB — MAGNESIUM: Magnesium: 1.9 mg/dL (ref 1.5–2.5)

## 2013-12-24 SURGERY — FIXATION, FRACTURE, INTERTROCHANTERIC, WITH INTRAMEDULLARY ROD
Anesthesia: General | Site: Hip | Laterality: Left

## 2013-12-24 MED ORDER — EPHEDRINE SULFATE 50 MG/ML IJ SOLN
INTRAMUSCULAR | Status: AC
  Filled 2013-12-24: qty 1

## 2013-12-24 MED ORDER — ACETAMINOPHEN 325 MG PO TABS: 650.0000 mg | ORAL_TABLET | Freq: Four times a day (QID) | ORAL | Status: DC | PRN

## 2013-12-24 MED ORDER — METHOCARBAMOL 500 MG PO TABS
500.0000 mg | ORAL_TABLET | Freq: Four times a day (QID) | ORAL | Status: DC | PRN
  Administered 2013-12-25 – 2013-12-26 (×3): 500 mg via ORAL
  Filled 2013-12-24 (×3): qty 1

## 2013-12-24 MED ORDER — LACTATED RINGERS IV SOLN
INTRAVENOUS | Status: DC | PRN
  Administered 2013-12-24: 07:00:00 via INTRAVENOUS

## 2013-12-24 MED ORDER — WARFARIN - PHARMACIST DOSING INPATIENT
Freq: Every day | Status: DC
Start: 2013-12-24 — End: 2013-12-26

## 2013-12-24 MED ORDER — NEOSTIGMINE METHYLSULFATE 1 MG/ML IJ SOLN
INTRAMUSCULAR | Status: DC | PRN
  Administered 2013-12-24: 5 mg via INTRAVENOUS

## 2013-12-24 MED ORDER — CEFAZOLIN SODIUM-DEXTROSE 2-3 GM-% IV SOLR
INTRAVENOUS | Status: AC
  Filled 2013-12-24: qty 50

## 2013-12-24 MED ORDER — SODIUM CHLORIDE 0.9 % IJ SOLN
INTRAMUSCULAR | Status: AC
  Filled 2013-12-24: qty 10

## 2013-12-24 MED ORDER — FENTANYL CITRATE 0.05 MG/ML IJ SOLN
25.0000 ug | INTRAMUSCULAR | Status: DC | PRN
  Administered 2013-12-24: 50 ug via INTRAVENOUS

## 2013-12-24 MED ORDER — DEXAMETHASONE SODIUM PHOSPHATE 10 MG/ML IJ SOLN
INTRAMUSCULAR | Status: DC | PRN
  Administered 2013-12-24: 10 mg via INTRAVENOUS

## 2013-12-24 MED ORDER — EPHEDRINE SULFATE 50 MG/ML IJ SOLN
INTRAMUSCULAR | Status: DC | PRN
  Administered 2013-12-24 (×3): 10 mg via INTRAVENOUS
  Administered 2013-12-24: 5 mg via INTRAVENOUS

## 2013-12-24 MED ORDER — TRAMADOL HCL 50 MG PO TABS: 100.0000 mg | ORAL_TABLET | Freq: Four times a day (QID) | ORAL | Status: DC | PRN

## 2013-12-24 MED ORDER — MUPIROCIN 2 % EX OINT
TOPICAL_OINTMENT | Freq: Two times a day (BID) | CUTANEOUS | Status: DC
  Administered 2013-12-24 – 2013-12-26 (×4): via NASAL
  Filled 2013-12-24: qty 22

## 2013-12-24 MED ORDER — SUCCINYLCHOLINE CHLORIDE 20 MG/ML IJ SOLN
INTRAMUSCULAR | Status: DC | PRN
  Administered 2013-12-24: 100 mg via INTRAVENOUS

## 2013-12-24 MED ORDER — METOCLOPRAMIDE HCL 10 MG PO TABS: 5.0000 mg | ORAL_TABLET | Freq: Three times a day (TID) | ORAL | Status: DC | PRN

## 2013-12-24 MED ORDER — GLYCOPYRROLATE 0.2 MG/ML IJ SOLN
INTRAMUSCULAR | Status: DC | PRN
  Administered 2013-12-24: 0.6 mg via INTRAVENOUS

## 2013-12-24 MED ORDER — METOCLOPRAMIDE HCL 5 MG/ML IJ SOLN: 5.0000 mg | Freq: Three times a day (TID) | INTRAMUSCULAR | Status: DC | PRN

## 2013-12-24 MED ORDER — MORPHINE SULFATE 2 MG/ML IJ SOLN: 2.0000 mg | INTRAMUSCULAR | Status: DC | PRN

## 2013-12-24 MED ORDER — PROPOFOL 10 MG/ML IV BOLUS
INTRAVENOUS | Status: DC | PRN
  Administered 2013-12-24: 100 mg via INTRAVENOUS

## 2013-12-24 MED ORDER — ONDANSETRON HCL 4 MG/2ML IJ SOLN
INTRAMUSCULAR | Status: DC | PRN
  Administered 2013-12-24: 4 mg via INTRAVENOUS

## 2013-12-24 MED ORDER — CEFAZOLIN SODIUM-DEXTROSE 2-3 GM-% IV SOLR
INTRAVENOUS | Status: DC | PRN
  Administered 2013-12-24: 2 g via INTRAVENOUS

## 2013-12-24 MED ORDER — METHOCARBAMOL 500 MG PO TABS
500.0000 mg | ORAL_TABLET | Freq: Three times a day (TID) | ORAL | Status: DC | PRN
  Administered 2013-12-24 (×2): 500 mg via ORAL
  Filled 2013-12-24 (×2): qty 1

## 2013-12-24 MED ORDER — DEXAMETHASONE SODIUM PHOSPHATE 10 MG/ML IJ SOLN
INTRAMUSCULAR | Status: AC
  Filled 2013-12-24: qty 1

## 2013-12-24 MED ORDER — FENTANYL CITRATE 0.05 MG/ML IJ SOLN
INTRAMUSCULAR | Status: AC
  Filled 2013-12-24: qty 5

## 2013-12-24 MED ORDER — WARFARIN SODIUM 1 MG PO TABS
1.0000 mg | ORAL_TABLET | Freq: Once | ORAL | Status: AC
  Administered 2013-12-24: 1 mg via ORAL
  Filled 2013-12-24: qty 1

## 2013-12-24 MED ORDER — ROCURONIUM BROMIDE 100 MG/10ML IV SOLN
INTRAVENOUS | Status: DC | PRN
  Administered 2013-12-24: 30 mg via INTRAVENOUS

## 2013-12-24 MED ORDER — PHENYLEPHRINE 40 MCG/ML (10ML) SYRINGE FOR IV PUSH (FOR BLOOD PRESSURE SUPPORT)
PREFILLED_SYRINGE | INTRAVENOUS | Status: AC
  Filled 2013-12-24: qty 10

## 2013-12-24 MED ORDER — ACETAMINOPHEN 650 MG RE SUPP: 650.0000 mg | Freq: Four times a day (QID) | RECTAL | Status: DC | PRN

## 2013-12-24 MED ORDER — FENTANYL CITRATE 0.05 MG/ML IJ SOLN
INTRAMUSCULAR | Status: DC | PRN
Start: 2013-12-24 — End: 2013-12-24
  Administered 2013-12-24 (×4): 25 ug via INTRAVENOUS

## 2013-12-24 MED ORDER — PHENOL 1.4 % MT LIQD: 1.0000 | OROMUCOSAL | Status: DC | PRN

## 2013-12-24 MED ORDER — SENNA 8.6 MG PO TABS
1.0000 | ORAL_TABLET | Freq: Two times a day (BID) | ORAL | Status: DC
  Administered 2013-12-24 – 2013-12-26 (×5): 8.6 mg via ORAL

## 2013-12-24 MED ORDER — CEFAZOLIN SODIUM-DEXTROSE 2-3 GM-% IV SOLR
2.0000 g | Freq: Four times a day (QID) | INTRAVENOUS | Status: AC
  Administered 2013-12-24 (×2): 2 g via INTRAVENOUS
  Filled 2013-12-24 (×2): qty 50

## 2013-12-24 MED ORDER — HYDROCODONE-ACETAMINOPHEN 5-325 MG PO TABS
1.0000 | ORAL_TABLET | Freq: Four times a day (QID) | ORAL | Status: DC | PRN
  Administered 2013-12-24: 1 via ORAL
  Filled 2013-12-24: qty 1
  Filled 2013-12-24: qty 2

## 2013-12-24 MED ORDER — METHOCARBAMOL 100 MG/ML IJ SOLN
500.0000 mg | Freq: Four times a day (QID) | INTRAMUSCULAR | Status: DC | PRN
  Filled 2013-12-24: qty 5

## 2013-12-24 MED ORDER — FENTANYL CITRATE 0.05 MG/ML IJ SOLN
INTRAMUSCULAR | Status: AC
  Filled 2013-12-24: qty 2

## 2013-12-24 MED ORDER — LIDOCAINE HCL (CARDIAC) 20 MG/ML IV SOLN
INTRAVENOUS | Status: AC
  Filled 2013-12-24: qty 5

## 2013-12-24 MED ORDER — ROCURONIUM BROMIDE 100 MG/10ML IV SOLN
INTRAVENOUS | Status: AC
  Filled 2013-12-24: qty 1

## 2013-12-24 MED ORDER — LIDOCAINE HCL (CARDIAC) 20 MG/ML IV SOLN
INTRAVENOUS | Status: DC | PRN
  Administered 2013-12-24: 100 mg via INTRAVENOUS

## 2013-12-24 MED ORDER — ONDANSETRON HCL 4 MG/2ML IJ SOLN: 4.0000 mg | Freq: Four times a day (QID) | INTRAMUSCULAR | Status: DC | PRN

## 2013-12-24 MED ORDER — LACTATED RINGERS IV SOLN: INTRAVENOUS | Status: DC

## 2013-12-24 MED ORDER — PROMETHAZINE HCL 25 MG/ML IJ SOLN
INTRAMUSCULAR | Status: AC
  Filled 2013-12-24: qty 1

## 2013-12-24 MED ORDER — NEOSTIGMINE METHYLSULFATE 1 MG/ML IJ SOLN
INTRAMUSCULAR | Status: AC
  Filled 2013-12-24: qty 10

## 2013-12-24 MED ORDER — GLYCOPYRROLATE 0.2 MG/ML IJ SOLN
INTRAMUSCULAR | Status: AC
  Filled 2013-12-24: qty 3

## 2013-12-24 MED ORDER — PHENYLEPHRINE HCL 10 MG/ML IJ SOLN
INTRAMUSCULAR | Status: DC | PRN
  Administered 2013-12-24 (×3): 80 ug via INTRAVENOUS

## 2013-12-24 MED ORDER — MENTHOL 3 MG MT LOZG: 1.0000 | LOZENGE | OROMUCOSAL | Status: DC | PRN

## 2013-12-24 MED ORDER — PROPOFOL 10 MG/ML IV BOLUS
INTRAVENOUS | Status: AC
  Filled 2013-12-24: qty 20

## 2013-12-24 MED ORDER — 0.9 % SODIUM CHLORIDE (POUR BTL) OPTIME
TOPICAL | Status: DC | PRN
  Administered 2013-12-24: 1000 mL

## 2013-12-24 MED ORDER — ONDANSETRON HCL 4 MG PO TABS: 4.0000 mg | ORAL_TABLET | Freq: Four times a day (QID) | ORAL | Status: DC | PRN

## 2013-12-24 MED ORDER — PROMETHAZINE HCL 25 MG/ML IJ SOLN
6.2500 mg | INTRAMUSCULAR | Status: DC | PRN
  Administered 2013-12-24: 6.25 mg via INTRAVENOUS

## 2013-12-24 MED ORDER — ONDANSETRON HCL 4 MG/2ML IJ SOLN
INTRAMUSCULAR | Status: AC
  Filled 2013-12-24: qty 2

## 2013-12-24 SURGICAL SUPPLY — 48 items
BIT DRILL 3.5 QC 155 (BIT) ×2 IMPLANT
BIT DRILL 3.5 QC 155MM (BIT) ×1
BLADE SURG 15 STRL LF DISP TIS (BLADE) ×1 IMPLANT
BLADE SURG 15 STRL SS (BLADE) ×2
BNDG GAUZE ELAST 4 BULKY (GAUZE/BANDAGES/DRESSINGS) ×3 IMPLANT
COVER SURGICAL LIGHT HANDLE (MISCELLANEOUS) IMPLANT
DRAPE PROXIMA HALF (DRAPES) IMPLANT
DRAPE STERI IOBAN 125X83 (DRAPES) ×3 IMPLANT
DRSG MEPILEX BORDER 4X4 (GAUZE/BANDAGES/DRESSINGS) ×3 IMPLANT
DRSG MEPILEX BORDER 4X8 (GAUZE/BANDAGES/DRESSINGS) IMPLANT
DRSG PAD ABDOMINAL 8X10 ST (GAUZE/BANDAGES/DRESSINGS) ×6 IMPLANT
DURAPREP 26ML APPLICATOR (WOUND CARE) ×3 IMPLANT
ELECT REM PT RETURN 9FT ADLT (ELECTROSURGICAL) ×3
ELECTRODE REM PT RTRN 9FT ADLT (ELECTROSURGICAL) ×1 IMPLANT
FACESHIELD WRAPAROUND (MASK) ×3 IMPLANT
GAUZE XEROFORM 5X9 LF (GAUZE/BANDAGES/DRESSINGS) ×3 IMPLANT
GLOVE BIOGEL PI IND STRL 8 (GLOVE) ×2 IMPLANT
GLOVE BIOGEL PI INDICATOR 8 (GLOVE) ×4
GLOVE ECLIPSE 8.0 STRL XLNG CF (GLOVE) ×3 IMPLANT
GLOVE ORTHO TXT STRL SZ7.5 (GLOVE) ×3 IMPLANT
GOWN STRL REUS W/ TWL XL LVL3 (GOWN DISPOSABLE) IMPLANT
GOWN STRL REUS W/TWL LRG LVL3 (GOWN DISPOSABLE) ×3 IMPLANT
GOWN STRL REUS W/TWL XL LVL3 (GOWN DISPOSABLE)
GUIDE PIN 3.2 LONG (PIN) ×3 IMPLANT
GUIDE PIN 3.2MM (MISCELLANEOUS) ×4
GUIDE PIN ORTH 343X3.2XBRAD (MISCELLANEOUS) ×2 IMPLANT
HIP SCREW SET (Screw) ×3 IMPLANT
KIT BASIN OR (CUSTOM PROCEDURE TRAY) ×3 IMPLANT
KIT ROOM TURNOVER OR (KITS) ×3 IMPLANT
MANIFOLD NEPTUNE II (INSTRUMENTS) IMPLANT
NAIL IMSH 10X36 (Nail) ×3 IMPLANT
NS IRRIG 1000ML POUR BTL (IV SOLUTION) ×3 IMPLANT
PACK GENERAL/GYN (CUSTOM PROCEDURE TRAY) ×3 IMPLANT
PAD ARMBOARD 7.5X6 YLW CONV (MISCELLANEOUS) ×6 IMPLANT
PAD CAST 4YDX4 CTTN HI CHSV (CAST SUPPLIES) ×2 IMPLANT
PADDING CAST COTTON 4X4 STRL (CAST SUPPLIES) ×4
SCREW 4.5X38 (Screw) ×3 IMPLANT
SCREW COMPRESSION (Screw) ×3 IMPLANT
SCREW LAG 90MM (Screw) ×2 IMPLANT
SCREW LAGSTD 90X21X12.7X9 (Screw) ×1 IMPLANT
STAPLER VISISTAT 35W (STAPLE) ×3 IMPLANT
SUT VIC AB 0 CT1 27 (SUTURE) ×4
SUT VIC AB 0 CT1 27XBRD ANBCTR (SUTURE) ×2 IMPLANT
SUT VIC AB 2-0 CT1 27 (SUTURE) ×4
SUT VIC AB 2-0 CT1 TAPERPNT 27 (SUTURE) ×2 IMPLANT
TOWEL OR 17X24 6PK STRL BLUE (TOWEL DISPOSABLE) ×3 IMPLANT
TOWEL OR 17X26 10 PK STRL BLUE (TOWEL DISPOSABLE) ×6 IMPLANT
WATER STERILE IRR 1000ML POUR (IV SOLUTION) IMPLANT

## 2013-12-24 NOTE — Anesthesia Postprocedure Evaluation (Signed)
Anesthesia Post Note  Patient: Vanessa Page  Procedure(s) Performed: Procedure(s) (LRB): INTRAMEDULLARY (IM) NAIL INTERTROCHANTRIC (Left)  Anesthesia type: General  Patient location: PACU  Post pain: Pain level controlled  Post assessment: Post-op Vital signs reviewed  Last Vitals:  Filed Vitals:   12/24/13 0950  BP:   Pulse: 66  Temp:   Resp: 12    Post vital signs: Reviewed  Level of consciousness: sedated  Complications: No apparent anesthesia complications

## 2013-12-24 NOTE — Brief Op Note (Signed)
12/23/2013 - 12/24/2013  8:57 AM  PATIENT:  Vanessa Page  80 y.o. female  PRE-OPERATIVE DIAGNOSIS:  left intertrochantric hip fracture  POST-OPERATIVE DIAGNOSIS:  left intertrochantric hip fracture  PROCEDURE:  Procedure(s): INTRAMEDULLARY (IM) NAIL INTERTROCHANTRIC (Left)  SURGEON:  Surgeon(s) and Role:    * Mcarthur Rossetti, MD - Primary  PHYSICIAN ASSISTANT: Benita Stabile, PA-C  ANESTHESIA:   general  EBL:  Total I/O In: -  Out: 275 [Urine:200; Blood:75]  BLOOD ADMINISTERED:none  DRAINS: none   LOCAL MEDICATIONS USED:  NONE  SPECIMEN:  No Specimen  DISPOSITION OF SPECIMEN:  N/A  COUNTS:  YES  TOURNIQUET:  * No tourniquets in log *  DICTATION: .Other Dictation: Dictation Number [id number]  PLAN OF CARE: Admit to inpatient   PATIENT DISPOSITION:  PACU - hemodynamically stable.   Delay start of Pharmacological VTE agent (>24hrs) due to surgical blood loss or risk of bleeding: no

## 2013-12-24 NOTE — Transfer of Care (Signed)
Immediate Anesthesia Transfer of Care Note  Patient: Vanessa Page  Procedure(s) Performed: Procedure(s) (LRB): INTRAMEDULLARY (IM) NAIL INTERTROCHANTRIC (Left)  Patient Location: PACU  Anesthesia Type: General  Level of Consciousness: sedated, patient cooperative and responds to stimulation  Airway & Oxygen Therapy: Patient Spontanous Breathing and Patient connected to face mask oxgen  Post-op Assessment: Report given to PACU RN and Post -op Vital signs reviewed and stable  Post vital signs: Reviewed and stable  Complications: No apparent anesthesia complications

## 2013-12-24 NOTE — Progress Notes (Signed)
ANTICOAGULATION CONSULT NOTE - Initial Consult  Pharmacy Consult for Warfarin Indication: Hx DVT and post-op VTE prophylaxis  Allergies  Allergen Reactions  . Codeine     Patient Measurements: Height: 5' (152.4 cm) Weight: 122 lb (55.339 kg) IBW/kg (Calculated) : 45.5  Vital Signs: Temp: 97.5 F (36.4 C) (04/19 1004) Temp src: Oral (04/19 0515) BP: 116/70 mmHg (04/19 1004) Pulse Rate: 70 (04/19 1004)  Labs:  Recent Labs  12/23/13 1620 12/24/13 0512  HGB 13.5 11.9*  HCT 40.2 35.4*  PLT 222 198  APTT  --  35  LABPROT 20.5* 21.7*  INR 1.82* 1.96*  CREATININE 0.89 0.77    Estimated Creatinine Clearance: 43.7 ml/min (by C-G formula based on Cr of 0.77).   Medical History: Past Medical History  Diagnosis Date  . Hypertension     Medications:  Scheduled:  . calcium carbonate  1 tablet Oral Q breakfast  .  ceFAZolin (ANCEF) IV  2 g Intravenous Q6H  . fentaNYL      . fluticasone  1 spray Each Nare Daily  . lisinopril  5 mg Oral BID  . multivitamin with minerals  1 tablet Oral Daily  . promethazine      . senna  1 tablet Oral BID   Infusions:  . dextrose 5 % and 0.9% NaCl 50 mL/hr at 12/24/13 0949   PRN: acetaminophen, acetaminophen, acetaminophen, acetaminophen, albuterol, alum & mag hydroxide-simeth, azelastine, guaiFENesin-dextromethorphan, HYDROcodone-acetaminophen, HYDROcodone-acetaminophen, menthol-cetylpyridinium, methocarbamol (ROBAXIN) IV, methocarbamol, methocarbamol, metoCLOPramide (REGLAN) injection, metoCLOPramide, morphine injection, morphine injection, ondansetron (ZOFRAN) IV, ondansetron (ZOFRAN) IV, ondansetron, ondansetron, phenol senna-docusate, traMADol  Assessment: 80 yo female on chronic warfarin for hx DVT, now s/p ORIF of left femur fracture.   Home dose: 2mg  daily except 1mg  Mon/Fri - last dose taken 4/17  INR 1.82 on 4/18, given vitamin K 5mg  po at 22:45.   INR up 1.96 4/19 am pre-op.  CBC low, no bleeding currently  reported.  Goal of Therapy:  INR 2-3   Plan:   Resume warfarin 1mg  tonight - lower than usual given elevated INR preop and upward trend.   No lovenox bridge ordered.  Daily PT/INR   Peggyann Juba, PharmD, BCPS Pager: [phone] 12/24/2013,10:32 AM

## 2013-12-24 NOTE — Progress Notes (Signed)
  Echocardiogram 2D Echocardiogram has been performed.  Vanessa Page 12/24/2013, 4:54 PM

## 2013-12-24 NOTE — Anesthesia Preprocedure Evaluation (Addendum)
Anesthesia Evaluation  Patient identified by MRN, date of birth, ID band Patient awake    Reviewed: Allergy & Precautions, H&P , NPO status , Patient's Chart, lab work & pertinent test results  Airway Mallampati: II TM Distance: >3 FB Neck ROM: Full    Dental  (+) Teeth Intact, Dental Advisory Given   Pulmonary neg pulmonary ROS,  History of bronchiectasis breath sounds clear to auscultation  Pulmonary exam normal       Cardiovascular hypertension, Pt. on medications DVT negative cardio ROS  Rhythm:Regular Rate:Normal     Neuro/Psych negative neurological ROS  negative psych ROS   GI/Hepatic negative GI ROS, Neg liver ROS,   Endo/Other  negative endocrine ROSThyroid resection  Renal/GU negative Renal ROS  negative genitourinary   Musculoskeletal negative musculoskeletal ROS (+)   Abdominal   Peds  Hematology negative hematology ROS (+) Chronic anticoagulant   Anesthesia Other Findings   Reproductive/Obstetrics                         Anesthesia Physical Anesthesia Plan  ASA: III  Anesthesia Plan: General   Post-op Pain Management:    Induction: Intravenous  Airway Management Planned: Oral ETT  Additional Equipment:   Intra-op Plan:   Post-operative Plan: Extubation in OR  Informed Consent: I have reviewed the patients History and Physical, chart, labs and discussed the procedure including the risks, benefits and alternatives for the proposed anesthesia with the patient or authorized representative who has indicated his/her understanding and acceptance.   Dental advisory given  Plan Discussed with: CRNA  Anesthesia Plan Comments:         Anesthesia Quick Evaluation

## 2013-12-24 NOTE — Progress Notes (Signed)
[facility] PROGRESS NOTE  Vanessa Page ZYS:[id number] DOB: September 11, 1930 DOA: 12/23/2013 PCP: No primary provider on file.  Assessment/Plan: Active Problems:   Intertrochanteric fracture of left hip - Ortho on board and managing - Pt will require physical therapy evaluation and recommendations - Pt is s/p Open reduction and internal fixation of left  proximal femur fracture utilizing intramedullary hip screw  History of DVT of lower extremity/ Chronic anticoagulation - Pharmacy managing coumadin recommendations.    Essential hypertension - Currently on lisinopril - will continue and monitor BP's  Code Status: full Family Communication:discussed with patient and family at bedside Disposition Plan:  Pending PT recommendations   Consultants:  Ortho: Dr. Ninfa Linden  Procedures:  As listed above  Antibiotics:  Pt on Cefazolin  HPI/Subjective: No new complaints. Patient states that pain medication regimen helping her feel more comfortable.   Objective: Filed Vitals:   12/24/13 1323  BP: 95/57  Pulse: 74  Temp: 97.7 F (36.5 C)  Resp: 18    Intake/Output Summary (Last 24 hours) at 12/24/13 1443 Last data filed at 12/24/13 1323  Gross per 24 hour  Intake   1980 ml  Output   1525 ml  Net    455 ml   Filed Weights   12/23/13 2300  Weight: 55.339 kg (122 lb)    Exam:   General:  Pt in NAD, alert and awake  Cardiovascular: RRR, no MRG  Respiratory: CTA BL, no wheezes  Abdomen: soft, NT, ND  Musculoskeletal: no clubbing   Data Reviewed: Basic Metabolic Panel:  Recent Labs Lab 12/23/13 1620 12/24/13 0512  NA 138 136*  K 3.8 4.5  CL 98 100  CO2 24 25  GLUCOSE 129* 151*  BUN 20 15  CREATININE 0.89 0.77  CALCIUM 9.1 8.2*  MG  --  1.9  PHOS  --  3.3   Liver Function Tests:  Recent Labs Lab 12/24/13 0512  AST 28  ALT 17  ALKPHOS 57  BILITOT 0.6  PROT 6.2  ALBUMIN 3.2*   No results found for this basename: LIPASE, AMYLASE,  in  the last 168 hours No results found for this basename: AMMONIA,  in the last 168 hours CBC:  Recent Labs Lab 12/23/13 1620 12/24/13 0512  WBC 8.9 16.0*  NEUTROABS 5.0 13.5*  HGB 13.5 11.9*  HCT 40.2 35.4*  MCV 87.4 87.8  PLT 222 198   Cardiac Enzymes: No results found for this basename: CKTOTAL, CKMB, CKMBINDEX, TROPONINI,  in the last 168 hours BNP (last 3 results) No results found for this basename: PROBNP,  in the last 8760 hours CBG: No results found for this basename: GLUCAP,  in the last 168 hours  Recent Results (from the past 240 hour(s))  SURGICAL PCR SCREEN     Status: Abnormal   Collection Time    12/24/13  6:00 AM      Result Value Ref Range Status   MRSA, PCR NEGATIVE  NEGATIVE Final   Staphylococcus aureus POSITIVE (*) NEGATIVE Final   Comment:            The Xpert SA Assay (FDA     approved for NASAL specimens     in patients over 50 years of age),     is one component of     a comprehensive surveillance     program.  Test performance has     been validated by [employer] for patients greater     than or equal  to 36 year old.     It is not intended     to diagnose infection nor to     guide or monitor treatment.     Studies: Dg Chest 1 View  12/23/2013   CLINICAL DATA:  Pre-op respiratory exam for left hip fracture.  EXAM: CHEST - 1 VIEW  COMPARISON:  None.  FINDINGS: The heart size and mediastinal contours are within normal limits. No evidence of pulmonary infiltrate or pleural effusion. Thoracic dextroscoliosis noted.  IMPRESSION: No acute findings.   Electronically Signed   By: Earle Gell M.D.   On: 12/23/2013 17:06   Dg Hip Complete Left  12/23/2013   CLINICAL DATA:  [place], pain  EXAM: LEFT HIP - COMPLETE 2+ VIEW  COMPARISON:  None.  FINDINGS: There is a comminuted intertrochanteric fracture of the left hip which extends into the proximal femur. Significant medial angulation. No hip dislocation. No pelvic fracture. Soft tissue swelling. Vascular  calcification.  IMPRESSION: Comminuted intertrochanteric fracture, left hip.   Electronically Signed   By: Rolla Flatten M.D.   On: 12/23/2013 17:04   Dg Femur Left  12/24/2013   CLINICAL DATA:  80 year old female undergoing treatment of left proximal femur fracture.  EXAM: LEFT FEMUR - 2 VIEW; DG C-ARM 1-60 MIN - NRPT [facility]  COMPARISON:  Preoperative films 12/23/2013.  FLUOROSCOPY TIME:  1 min 47 seconds.  FINDINGS: Six intraoperative fluoroscopic views of the left femur demonstrating left femur intra medullary rod placement with proximal interlocking dynamic hip screw and distal interlocking cortical screw. Hardware appears intact. Improved alignment of comminuted proximal femur fracture fragments.  IMPRESSION: ORIF left femur with no adverse features.   Electronically Signed   By: Lars Pinks M.D.   On: 12/24/2013 13:41   Dg C-arm 1-60 Min-no Report  12/24/2013   CLINICAL DATA:  80 year old female undergoing treatment of left proximal femur fracture.  EXAM: LEFT FEMUR - 2 VIEW; DG C-ARM 1-60 MIN - [facility]PT [facility]  COMPARISON:  Preoperative films 12/23/2013.  FLUOROSCOPY TIME:  1 min 47 seconds.  FINDINGS: Six intraoperative fluoroscopic views of the left femur demonstrating left femur intra medullary rod placement with proximal interlocking dynamic hip screw and distal interlocking cortical screw. Hardware appears intact. Improved alignment of comminuted proximal femur fracture fragments.  IMPRESSION: ORIF left femur with no adverse features.   Electronically Signed   By: Lars Pinks M.D.   On: 12/24/2013 13:41    Scheduled Meds: . calcium carbonate  1 tablet Oral Q breakfast  .  ceFAZolin (ANCEF) IV  2 g Intravenous Q6H  . fentaNYL      . fluticasone  1 spray Each Nare Daily  . lisinopril  5 mg Oral BID  . multivitamin with minerals  1 tablet Oral Daily  . promethazine      . senna  1 tablet Oral BID  . warfarin  1 mg Oral ONCE-1800  . Warfarin - Pharmacist Dosing Inpatient   Does not apply q1800    Continuous Infusions: . dextrose 5 % and 0.9% NaCl 50 mL/hr at 12/24/13 0949     Time spent: > 35 minutes    [facility] Pager [phone] If 7PM-7AM, please contact night-coverage at [email or web address], password [facility] 12/24/2013, 2:43 PM  LOS: 1 day

## 2013-12-24 NOTE — Plan of Care (Signed)
Problem: Phase I Progression Outcomes Goal: Pre op Protime within normal limits Outcome: Completed/Met Date Met:  12/24/13 Vitamin K given.

## 2013-12-24 NOTE — Op Note (Signed)
[name], Page NO.:  [id number]  MEDICAL RECORD NO.:  [id number]  LOCATION:  27                         FACILITY:  [facility]  PHYSICIAN:  Lind Guest. Ninfa Linden, M.D.DATE OF BIRTH:  1931/01/10  DATE OF PROCEDURE:  12/24/2013 DATE OF DISCHARGE:                              OPERATIVE REPORT   POSTOPERATIVE DIAGNOSIS:  Left comminuted intertrochanteric femur fracture with subtrochanteric extension.  POSTOPERATIVE DIAGNOSIS:  Left comminuted intertrochanteric femur fracture with subtrochanteric extension.  PROCEDURE PERFORMED:  Open reduction and internal fixation of left proximal femur fracture utilizing intramedullary hip screw.  IMPLANTS:  [employer] 10 x 360 femoral nail with a 90 mm lag screw and compression component with a 4.5 mm distal interlocking screw measuring 38 mm in length.  SURGEON:  Lind Guest. Ninfa Linden, M.D.  ASSISTING:  Erskine Emery, PA-C.  ANESTHESIA:  General.  ANTIBIOTICS:  2 g IV Ancef.  BLOOD LOSS:  75-100 mL.  COMPLICATIONS:  None.  INDICATIONS:  Ms. Vanessa Page is a very pleasant 80 year old female from [place], [place], was done here in [place] visiting family.  She was tossing a ball with a grandchild, and she accidentally slipped and grasped and fell with her left hip landing on concrete directly on her left hip.  She denied any syncopal episode and this was just a mechanical fall.  She was transported to the [facility] Emergency Room and x-rays were obtained and found highly comminuted left proximal femur fracture.  Orthopedic Surgery was consulted as well as [facility] hospitalist, who graciously admitted the patient and got her worked up and cleared for surgery for this morning.  We did place her in 10 pounds of [place] traction overnight.  The risks and benefits of the surgery were explained to her family in detail including the risk of acute blood loss anemia, nerve vessel injury, fracture nonunion, and  DVT.  She is someone who is on chronic Coumadin therapy.  The goals of surgery are improved left leg alignment, decreased pain, improved mobility, and overall improved quality of life with ability to hopefully heal the fracture.  PROCEDURE DESCRIPTION:  After informed consent was obtained, appropriate left hip was marked.  She was brought to the operating room while she was on her stretcher.  General anesthesia was then obtained.  She was then gently placed on the fracture table with perineal post in place and her left leg in in-line skeletal traction device.  Her right leg was placed in a stirrup with the hip abducted and flexed out of the field with appropriate padding in the popliteal area, and the perineal area as well as the lateral leg.  We then pulled gentle traction through her left leg and locked this down.  We adducted the femur and slightly internally rotated.  Under direct fluoroscopy, then we were able to assess the fracture and we were pleased with the ability getting close to the anatomically reduced as possible.  I was pleased with this.  Then keeping the intramedullary nail sterile in their box, under direct fluoroscopic guidance, I laid the box with the 10 x 36 femoral nail over the femur and this felt to be the correct fit for the femur.  This was  then opened up sterilely on the back table.  We then prepped the left hip and leg with DuraPrep and sterile drapes.  Sterile shower curtain drape was applied as well.  Time-out was called to identify correct patient, correct left hip.  We then made a small incision proximal to the greater trochanter and just to this a little bit posterior, because the proximal piece being slightly externally rotated.  Under direct fluoroscopy, I was then able to place a temporary guide pin at the tip of the greater trochanter and advanced this down to the level lesser trochanter.  I verified this in AP and lateral planes, and then  used initiating reamer to open up the femoral canal.  I was then able to be easily pass the femoral nail in antegrade fashion, then the tip of the greater trochanter down toward the knee and I verified again is placed under direct fluoroscopy.  The fracture then still remained reduced. Next, using the outrigger guide, I made a small incision at the mid thigh and on the lateral aspect.  I was able to insert a guide pin.  The lateral cortex traversing the fracture and near center-center position in the femoral head.  I took a measurement off this and then drilled to a depth of 90 mm lag screw.  I then placed the lag screw without difficulty and was able to lock the rod up top.  I then took the traction off the leg and placed a compression component as well to compress the fracture.  We then removed the outrigger guide and went down to the knee.  We placed a single distal interlock in the dynamic slot of the rod from lateral to medial without difficulty.  I then went back up to the hip and from lateral position all the way to an AP position advance the C-arm to verify the position of the lag screw at the hip.  Again, we were pleased with the fracture reduction overall. Three small incisions were then copiously irrigated with normal saline solution.  We closed the deep tissue 0 Vicryl followed by 2-0 Vicryl subcutaneous tissue, interrupted staples in skin and all 3 incisions. Small pieces of Xeroform and Mepilex dressing was applied to all of these.  She was then gently taken off the fracture table, awakened, extubated, taken to recovery room in stable condition.  All final counts were correct.  There were no complications noted.  Postoperatively, we will start physical therapy tomorrow, with a likely just touch down weightbearing to 25% weightbearing due to the high comminution of the proximal femur fracture, with the goal of locally advancing her weightbearing status to full weightbearing  over the next 4-6 weeks.     Lind Guest. Ninfa Linden, M.D.     CYB/MEDQ  D:  12/24/2013  T:  12/24/2013  Job:  [id number]

## 2013-12-25 ENCOUNTER — Encounter (HOSPITAL_COMMUNITY): Payer: Self-pay | Admitting: Orthopaedic Surgery

## 2013-12-25 DIAGNOSIS — S72143A Displaced intertrochanteric fracture of unspecified femur, initial encounter for closed fracture: Secondary | ICD-10-CM

## 2013-12-25 DIAGNOSIS — W19XXXA Unspecified fall, initial encounter: Secondary | ICD-10-CM

## 2013-12-25 DIAGNOSIS — D72829 Elevated white blood cell count, unspecified: Secondary | ICD-10-CM

## 2013-12-25 LAB — CBC
HEMATOCRIT: 32.7 % — AB (ref 36.0–46.0)
HEMOGLOBIN: 11 g/dL — AB (ref 12.0–15.0)
MCH: 29.8 pg (ref 26.0–34.0)
MCHC: 33.6 g/dL (ref 30.0–36.0)
MCV: 88.6 fL (ref 78.0–100.0)
Platelets: 167 10*3/uL (ref 150–400)
RBC: 3.69 MIL/uL — AB (ref 3.87–5.11)
RDW: 14.2 % (ref 11.5–15.5)
WBC: 20.6 10*3/uL — ABNORMAL HIGH (ref 4.0–10.5)

## 2013-12-25 LAB — BASIC METABOLIC PANEL
BUN: 14 mg/dL (ref 6–23)
CHLORIDE: 103 meq/L (ref 96–112)
CO2: 26 meq/L (ref 19–32)
Calcium: 8.3 mg/dL — ABNORMAL LOW (ref 8.4–10.5)
Creatinine, Ser: 0.62 mg/dL (ref 0.50–1.10)
GFR calc Af Amer: >90 mL/min (ref >90)
GFR calc non Af Amer: 83 mL/min — ABNORMAL LOW (ref >90)
Glucose, Bld: 168 mg/dL — ABNORMAL HIGH (ref 70–99)
POTASSIUM: 4.4 meq/L (ref 3.7–5.3)
SODIUM: 138 meq/L (ref 137–147)

## 2013-12-25 LAB — PROTIME-INR
INR: 2.12 — AB (ref 0.00–1.49)
Prothrombin Time: 23.1 seconds — ABNORMAL HIGH (ref 11.6–15.2)

## 2013-12-25 MED ORDER — SODIUM CHLORIDE 0.9 % IV BOLUS (SEPSIS)
500.0000 mL | Freq: Once | INTRAVENOUS | Status: AC
  Administered 2013-12-25: 500 mL via INTRAVENOUS

## 2013-12-25 MED ORDER — WARFARIN SODIUM 1 MG PO TABS
1.0000 mg | ORAL_TABLET | Freq: Once | ORAL | Status: AC
  Administered 2013-12-25: 1 mg via ORAL
  Filled 2013-12-25: qty 1

## 2013-12-25 NOTE — Progress Notes (Signed)
ANTICOAGULATION CONSULT NOTE - Follow-up Consult  Pharmacy Consult for Warfarin Indication: Hx DVT and post-op VTE prophylaxis  Allergies  Allergen Reactions  . Codeine     Patient Measurements: Height: 5' (152.4 cm) Weight: 122 lb (55.339 kg) IBW/kg (Calculated) : 45.5  Vital Signs: Temp: 98 F (36.7 C) (04/20 1003) Temp src: Oral (04/20 1003) BP: 92/53 mmHg (04/20 1003) Pulse Rate: 76 (04/20 1003)  Labs:  Recent Labs  12/23/13 1620 12/24/13 0512 12/25/13 0448  HGB 13.5 11.9* 11.0*  HCT 40.2 35.4* 32.7*  PLT 222 198 167  APTT  --  35  --   LABPROT 20.5* 21.7* 23.1*  INR 1.82* 1.96* 2.12*  CREATININE 0.89 0.77 0.62    Estimated Creatinine Clearance: 43.7 ml/min (by C-G formula based on Cr of 0.62).   Medical History: Past Medical History  Diagnosis Date  . Hypertension     Medications:  Scheduled:  . calcium carbonate  1 tablet Oral Q breakfast  . fluticasone  1 spray Each Nare Daily  . multivitamin with minerals  1 tablet Oral Daily  . mupirocin ointment   Nasal BID  . senna  1 tablet Oral BID  . warfarin  1 mg Oral ONCE-1800  . Warfarin - Pharmacist Dosing Inpatient   Does not apply q1800   Infusions:  . dextrose 5 % and 0.9% NaCl 50 mL/hr at 12/24/13 1432   PRN: acetaminophen, acetaminophen, acetaminophen, acetaminophen, albuterol, alum & mag hydroxide-simeth, azelastine, guaiFENesin-dextromethorphan, HYDROcodone-acetaminophen, HYDROcodone-acetaminophen, menthol-cetylpyridinium, methocarbamol (ROBAXIN) IV, methocarbamol, methocarbamol, metoCLOPramide (REGLAN) injection, metoCLOPramide, morphine injection, morphine injection, ondansetron (ZOFRAN) IV, ondansetron (ZOFRAN) IV, ondansetron, ondansetron, phenol senna-docusate, traMADol  Assessment: 80 yo female on chronic warfarin for hx DVT, now s/p ORIF of left femur fracture.   Home dose: 2mg  daily except 1mg  Mon/Fri   INR 1.82 on 4/18, given vitamin K 5mg  po at 22:45.   INR up 1.96 4/19 am  pre-op.  INR at goal up 2.12 today  CBC low, no bleeding currently reported.  Goal of Therapy:  INR 2-3   Plan:   Repeat warfarin 1mg  tonight - will attempt to resume as patient was taking at home.  No lovenox bridge ordered.  Daily PT/INR   Kizzie Furnish, PharmD Pager: [phone]  12/25/2013,12:43 PM

## 2013-12-25 NOTE — Consult Note (Signed)
Physical Medicine and Rehabilitation Consult Reason for Consult: left hip fracture Referring Physician: Dr. Wendee Beavers.    HPI: Vanessa Page is a 80 y.o. female with history of HTN, DVT-chronic coumadin, Bronchiectasis; who tripped and fell while playing with her grand kids on 12/23/13. She developed immediate onset of left hip pain due to Left comminuted  IT hip fracture. INR at admission 1.82 and coumadin held. On 12/24/13, patient underwent IM nailing of left hip by Dr. Ninfa Linden. CBC today with leucocytosis with WBC-20.6.  Post op PWB and PT evaluations done today. CIR recommended by rehab team.  Systolic blood pressure today 87, poor oral intake of fluids Patient lives in [place] in a [facility]  Review of Systems  Constitutional: Positive for malaise/fatigue. Negative for fever and chills.  Respiratory: Negative for shortness of breath.   Cardiovascular: Negative for chest pain.  Gastrointestinal: Negative for abdominal pain.  Musculoskeletal: Positive for falls and joint pain. Negative for back pain and neck pain.  Neurological: Negative for speech change.  Psychiatric/Behavioral: Negative for hallucinations and memory loss.    Past Medical History  Diagnosis Date  . Hypertension    History reviewed. No pertinent past surgical history.  No family history on file.  Social History:   Lives in [place] and currently visiting family.  She has no tobacco, alcohol, and drug history on file.   Allergies  Allergen Reactions  . Codeine    Medications Prior to Admission  Medication Sig Dispense Refill  . albuterol (PROVENTIL HFA;VENTOLIN HFA) 108 (90 BASE) MCG/ACT inhaler Inhale 1 puff into the lungs every 6 (six) hours as needed for wheezing or shortness of breath.      Marland Kitchen alendronate (FOSAMAX) 70 MG tablet Take 70 mg by mouth once a week. Take with a full glass of water on an empty stomach.      Marland Kitchen azelastine (ASTELIN) 137 MCG/SPRAY nasal spray Place 2 sprays into both  nostrils 2 (two) times daily as needed for allergies. Use in each nostril as directed      . CALCIUM CARBONATE PO Take 1 tablet by mouth daily.      Marland Kitchen conjugated estrogens (PREMARIN) vaginal cream Place 1 Applicatorful vaginally 3 (three) times a week.      . fluticasone (FLONASE) 50 MCG/ACT nasal spray Place into both nostrils daily.      Marland Kitchen lisinopril (PRINIVIL,ZESTRIL) 5 MG tablet Take 5 mg by mouth 2 (two) times daily.      Marland Kitchen warfarin (COUMADIN) 2 MG tablet Take 1-2 mg by mouth daily. Takes 1 mg(1/2 tablet) on Monday and Friday and 2 mg (1 tablet) the rest of the week.        Home: Home Living Family/patient expects to be discharged to:: Inpatient rehab Living Arrangements: Spouse/significant other Additional Comments: visiting from [place] and fell.  Functional History: Prior Function Level of Independence: Independent Functional Status:  Mobility: Bed Mobility Overal bed mobility: Needs Assistance Bed Mobility: Supine to Sit Supine to sit: Mod assist;HOB elevated General bed mobility comments: pt used sheet to slide LLE to edge, cues for technique, extra time for working through PAIN. Transfers Overall transfer level: Needs assistance Equipment used: Rolling walker (2 wheeled) Transfers: Sit to/from [facility] Sit to Stand: Mod assist Stand pivot transfers: Mod assist General transfer comment: Cues for PWB, and technique. Ambulation/Gait Ambulation/Gait assistance: +2 safety/equipment;Mod assist Ambulation Distance (Feet): 5 Feet Assistive device: Rolling walker (2 wheeled) Gait Pattern/deviations: Step-to pattern General Gait Details: cues for  sequence and PWB.    ADL:    Cognition: Cognition Overall Cognitive Status: Within Functional Limits for tasks assessed Orientation Level: Oriented X4 Cognition Arousal/Alertness: Awake/alert Behavior During Therapy: WFL for tasks assessed/performed Overall Cognitive Status: Within Functional Limits for  tasks assessed  Blood pressure 92/53, pulse 76, temperature 98 F (36.7 C), temperature source Oral, resp. rate 18, height 5' (1.524 m), weight 55.339 kg (122 lb), SpO2 97.00%. Physical Exam  Nursing note and vitals reviewed. Constitutional: She is oriented to person, place, and time. She appears well-developed and well-nourished.  HENT:  Head: Normocephalic and atraumatic.  Eyes: Conjunctivae and EOM are normal. Pupils are equal, round, and reactive to light.  Neck: Normal range of motion.  Cardiovascular: Normal rate, regular rhythm and normal heart sounds.   Respiratory: Effort normal and breath sounds normal.  GI: Soft. Bowel sounds are normal. She exhibits no distension. There is no tenderness.  Musculoskeletal: Normal range of motion.  Neurological: She is alert and oriented to person, place, and time. A sensory deficit is present. She exhibits normal muscle tone. Gait abnormal.  Motor strength is 4/5 right deltoid 5/5 left deltoid 5/5 bilateral bicep triceps grip 5/5 right hip flexor knee extensor ankle dorsiflexor plantar flexor 5/5 left ankle dorsiflexor plantar flexor trace at the hip flexor and knee extensor secondary to pain  Psychiatric: She has a normal mood and affect.    Results for orders placed during the hospital encounter of 12/23/13 (from the past 24 hour(s))  CBC     Status: Abnormal   Collection Time    12/25/13  4:48 AM      Result Value Ref Range   WBC 20.6 (*) 4.0 - 10.5 K/uL   RBC 3.69 (*) 3.87 - 5.11 MIL/uL   Hemoglobin 11.0 (*) 12.0 - 15.0 g/dL   HCT 32.7 (*) 36.0 - 46.0 %   MCV 88.6  78.0 - 100.0 fL   MCH 29.8  26.0 - 34.0 pg   MCHC 33.6  30.0 - 36.0 g/dL   RDW 14.2  11.5 - 15.5 %   Platelets 167  150 - 400 K/uL  BASIC METABOLIC PANEL     Status: Abnormal   Collection Time    12/25/13  4:48 AM      Result Value Ref Range   Sodium 138  137 - 147 mEq/L   Potassium 4.4  3.7 - 5.3 mEq/L   Chloride 103  96 - 112 mEq/L   CO2 26  19 - 32 mEq/L    Glucose, Bld 168 (*) 70 - 99 mg/dL   BUN 14  6 - 23 mg/dL   Creatinine, Ser 0.62  0.50 - 1.10 mg/dL   Calcium 8.3 (*) 8.4 - 10.5 mg/dL   GFR calc non Af Amer 83 (*) >90 mL/min   GFR calc Af Amer >90  >90 mL/min  PROTIME-INR     Status: Abnormal   Collection Time    12/25/13  4:48 AM      Result Value Ref Range   Prothrombin Time 23.1 (*) 11.6 - 15.2 seconds   INR 2.12 (*) 0.00 - 1.49   Dg Chest 1 View  12/23/2013   CLINICAL DATA:  Pre-op respiratory exam for left hip fracture.  EXAM: CHEST - 1 VIEW  COMPARISON:  None.  FINDINGS: The heart size and mediastinal contours are within normal limits. No evidence of pulmonary infiltrate or pleural effusion. Thoracic dextroscoliosis noted.  IMPRESSION: No acute findings.   Electronically Signed  By: Earle Gell M.D.   On: 12/23/2013 17:06   Dg Hip Complete Left  12/23/2013   CLINICAL DATA:  [place], pain  EXAM: LEFT HIP - COMPLETE 2+ VIEW  COMPARISON:  None.  FINDINGS: There is a comminuted intertrochanteric fracture of the left hip which extends into the proximal femur. Significant medial angulation. No hip dislocation. No pelvic fracture. Soft tissue swelling. Vascular calcification.  IMPRESSION: Comminuted intertrochanteric fracture, left hip.   Electronically Signed   By: Rolla Flatten M.D.   On: 12/23/2013 17:04   Dg Femur Left  12/24/2013   CLINICAL DATA:  80 year old female undergoing treatment of left proximal femur fracture.  EXAM: LEFT FEMUR - 2 VIEW; DG C-ARM 1-60 MIN - NRPT [facility]  COMPARISON:  Preoperative films 12/23/2013.  FLUOROSCOPY TIME:  1 min 47 seconds.  FINDINGS: Six intraoperative fluoroscopic views of the left femur demonstrating left femur intra medullary rod placement with proximal interlocking dynamic hip screw and distal interlocking cortical screw. Hardware appears intact. Improved alignment of comminuted proximal femur fracture fragments.  IMPRESSION: ORIF left femur with no adverse features.   Electronically Signed   By: Lars Pinks M.D.   On: 12/24/2013 13:41   Dg C-arm 1-60 Min-no Report  12/24/2013   CLINICAL DATA:  80 year old female undergoing treatment of left proximal femur fracture.  EXAM: LEFT FEMUR - 2 VIEW; DG C-ARM 1-60 MIN - NRPT [facility]  COMPARISON:  Preoperative films 12/23/2013.  FLUOROSCOPY TIME:  1 min 47 seconds.  FINDINGS: Six intraoperative fluoroscopic views of the left femur demonstrating left femur intra medullary rod placement with proximal interlocking dynamic hip screw and distal interlocking cortical screw. Hardware appears intact. Improved alignment of comminuted proximal femur fracture fragments.  IMPRESSION: ORIF left femur with no adverse features.   Electronically Signed   By: Lars Pinks M.D.   On: 12/24/2013 13:41    Assessment/Plan: Diagnosis: Left intertrochanteric hip fracture status post IM rod postop day #1 1. Does the need for close, 24 hr/day medical supervision in concert with the patient's rehab needs make it unreasonable for this patient to be served in a less intensive setting? Yes 2. Co-Morbidities requiring supervision/potential complications: Pain control, hypotension 3. Due to bladder management, bowel management, safety, skin/wound care, disease management, medication administration, pain management and patient education, does the patient require 24 hr/day rehab nursing? Yes 4. Does the patient require coordinated care of a physician, rehab nurse, PT (1-2 hrs/day, 5 days/week) and OT (1-2 hrs/day, 5 days/week) to address physical and functional deficits in the context of the above medical diagnosis(es)? Yes Addressing deficits in the following areas: balance, endurance, locomotion, strength, transferring, bathing, dressing, feeding and toileting 5. Can the patient actively participate in an intensive therapy program of at least 3 hrs of therapy per day at least 5 days per week? Yes 6. The potential for patient to make measurable gains while on inpatient rehab is  excellent 7. Anticipated functional outcomes upon discharge from inpatient rehab are modified independent  with PT, modified independent with OT, n/a with SLP. 8. Estimated rehab length of stay to reach the above functional goals is: 7-10 days 9. Does the patient have adequate social supports to accommodate these discharge functional goals? Yes 10. Anticipated D/C setting: Home 11. Anticipated post D/C treatments: [place] therapy 12. Overall Rehab/Functional Prognosis: excellent  RECOMMENDATIONS: This patient's condition is appropriate for continued rehabilitative care in the following setting: CIR Patient has agreed to participate in recommended program. Yes Note that insurance prior  authorization may be required for reimbursement for recommended care.  Comment: Patient has local family but would like to get back to [place] as soon as possible.    12/25/2013

## 2013-12-25 NOTE — Progress Notes (Signed)
Rehab admissions - I met with patient, husband and family members.  I gave patient rehab booklets and answered many questions about rehab.  Patient would like to come to inpatient rehab.  If MD clears patient in am, can potentially admit to inpatient rehab tomorrow.  We do have beds available tomorrow.  My partner will follow up in am.  Manuela Schwartz can be reached at [phone].

## 2013-12-25 NOTE — PMR Pre-admission (Signed)
PMR Admission Coordinator Pre-Admission Assessment  Patient: Vanessa Page is an 80 y.o., female MRN: [id number] DOB: 03/08/1931 Height: 5' (152.4 cm) Weight: 55.339 kg (122 lb)              Insurance Information HMO:  No    PPO:       PCP:       IPA:       80/20:       OTHER:   PRIMARY: Medicare A/B      Policy#: [id number]      Subscriber: Rod Can CM Name:        Phone#:       Fax#:   Pre-Cert#:        Employer: Retired Benefits:  Phone #:       Name: Checked in [place]. Date: 03/07/98     Deduct: $1260      Out of Pocket Max: none      Life Max: unlimited CIR: 100%      SNF: 100 days Outpatient: 80%     Co-Pay: 20% Home Health: 100%      Co-Pay: none DME: 80%     Co-Pay: 20% Providers: patient's choice  SECONDARY: [place]      Policy#: [id number]      Subscriber: Rod Can CM Name:        Phone#:       Fax#:   Pre-Cert#:        Employer: Retired Benefits:  Phone #: [phone]     Name:   Eff. Date:       Deduct:        Out of Pocket Max:        Life Max:   CIR:        SNF:   Outpatient:       Co-Pay:   Home Health:        Co-Pay:   DME:       Co-Pay:     Emergency Contact Information Contact Information   Name Relation Home Work Mobile   [place] Spouse [phone]       Current Medical History  Patient Admitting Diagnosis:  L IT hip Fx  History of Present Illness: An 80 y.o. female with history of HTN, DVT-chronic coumadin, Bronchiectasis; who tripped and fell while playing with her grand kids on 12/23/13. She developed immediate onset of left hip pain due to Left comminuted IT hip fracture. INR at admission 1.82 and coumadin held. On 12/24/13, patient underwent IM nailing of left hip by Dr. Ninfa Linden. CBC today with leucocytosis with WBC-20.6. Post op PWB and PT evaluations done today. CIR recommended by rehab team.  Systolic blood pressure today 87, poor oral intake of fluids. Patient lives in [place] in a [facility]       Past Medical  History  Past Medical History  Diagnosis Date  . Hypertension     Family History  family history is not on file.  Prior Rehab/Hospitalizations: Had outpatient therapy 10/14 for right shoulder bursitis.   Current Medications  Current facility-administered medications:acetaminophen (TYLENOL) suppository 650 mg, 650 mg, Rectal, Q6H PRN, Simbiso Ranga, MD;  acetaminophen (TYLENOL) suppository 650 mg, 650 mg, Rectal, Q6H PRN, Mcarthur Rossetti, MD;  acetaminophen (TYLENOL) tablet 650 mg, 650 mg, Oral, Q6H PRN, Simbiso Ranga, MD;  acetaminophen (TYLENOL) tablet 650 mg, 650 mg, Oral, Q6H PRN, Mcarthur Rossetti, MD albuterol (PROVENTIL) (2.5 MG/3ML) 0.083% nebulizer solution 2.5 mg, 2.5 mg, Nebulization, Q2H PRN, Simbiso Ranga,  MD;  alum & mag hydroxide-simeth (MAALOX/MYLANTA) 200-200-20 MG/5ML suspension 30 mL, 30 mL, Oral, Q6H PRN, Simbiso Ranga, MD;  azelastine (ASTELIN) nasal spray 2 spray, 2 spray, Each Nare, BID PRN, Simbiso Ranga, MD calcium carbonate (OS-CAL - dosed in mg of elemental calcium) tablet 500 mg of elemental calcium, 1 tablet, Oral, Q breakfast, Simbiso Ranga, MD, 500 mg of elemental calcium at 12/25/13 0820;  dextrose 5 %-0.9 % sodium chloride infusion, , Intravenous, Continuous, Simbiso Ranga, MD, Last Rate: 50 mL/hr at 12/24/13 1432;  fluticasone (FLONASE) 50 MCG/ACT nasal spray 1 spray, 1 spray, Each Nare, Daily, Simbiso Ranga, MD, 1 spray at 12/25/13 1029 guaiFENesin-dextromethorphan (ROBITUSSIN DM) 100-10 MG/5ML syrup 5 mL, 5 mL, Oral, Q4H PRN, Simbiso Ranga, MD;  HYDROcodone-acetaminophen (NORCO/VICODIN) 5-325 MG per tablet 1-2 tablet, 1-2 tablet, Oral, Q4H PRN, Simbiso Ranga, MD, 1 tablet at 12/25/13 1158;  HYDROcodone-acetaminophen (NORCO/VICODIN) 5-325 MG per tablet 1-2 tablet, 1-2 tablet, Oral, Q6H PRN, Mcarthur Rossetti, MD, 1 tablet at 12/24/13 1948 menthol-cetylpyridinium (CEPACOL) lozenge 3 mg, 1 lozenge, Oral, PRN, Mcarthur Rossetti, MD;  methocarbamol  (ROBAXIN) 500 mg in dextrose 5 % 50 mL IVPB, 500 mg, Intravenous, Q6H PRN, Mcarthur Rossetti, MD;  methocarbamol (ROBAXIN) tablet 500 mg, 500 mg, Oral, Q8H PRN, Dianne Dun, NP, 500 mg at 12/24/13 1949;  methocarbamol (ROBAXIN) tablet 500 mg, 500 mg, Oral, Q6H PRN, Mcarthur Rossetti, MD, 500 mg at 12/25/13 [id number] metoCLOPramide (REGLAN) injection 5-10 mg, 5-10 mg, Intravenous, Q8H PRN, Mcarthur Rossetti, MD;  metoCLOPramide (REGLAN) tablet 5-10 mg, 5-10 mg, Oral, Q8H PRN, Mcarthur Rossetti, MD;  morphine 2 MG/ML injection 2 mg, 2 mg, Intravenous, Q3H PRN, Nat Math, MD, 2 mg at 12/24/13 1618;  morphine 2 MG/ML injection 2 mg, 2 mg, Intravenous, Q2H PRN, Mcarthur Rossetti, MD multivitamin with minerals tablet 1 tablet, 1 tablet, Oral, Daily, Simbiso Ranga, MD, 1 tablet at 12/25/13 1029;  mupirocin ointment (BACTROBAN) 2 %, , Nasal, BID, Mcarthur Rossetti, MD;  ondansetron [facility]) injection 4 mg, 4 mg, Intravenous, Q6H PRN, Simbiso Ranga, MD;  ondansetron (ZOFRAN) injection 4 mg, 4 mg, Intravenous, Q6H PRN, Mcarthur Rossetti, MD;  ondansetron [facility]) tablet 4 mg, 4 mg, Oral, Q6H PRN, Simbiso Ranga, MD ondansetron (ZOFRAN) tablet 4 mg, 4 mg, Oral, Q6H PRN, Mcarthur Rossetti, MD;  phenol (CHLORASEPTIC) mouth spray 1 spray, 1 spray, Mouth/Throat, PRN, Mcarthur Rossetti, MD;  senna [facility]) tablet 8.6 mg, 1 tablet, Oral, BID, Mcarthur Rossetti, MD, 8.6 mg at 12/25/13 1029;  senna-docusate (Senokot-S) tablet 1 tablet, 1 tablet, Oral, QHS PRN, Simbiso Ranga, MD sodium chloride 0.9 % bolus 500 mL, 500 mL, Intravenous, Once, Velvet Bathe, MD;  traMADol [facility]) tablet 100 mg, 100 mg, Oral, Q6H PRN, Mcarthur Rossetti, MD;  warfarin (COUMADIN) tablet 1 mg, 1 mg, Oral, ONCE-1800, Julio Sicks, [facility];  Warfarin - Pharmacist Dosing Inpatient, , Does not apply, q1800, Emiliano Dyer, [facility]  Patients Current Diet: General  Precautions /  Restrictions Precautions Precautions: Fall Restrictions Weight Bearing Restrictions: Yes LLE Weight Bearing: Partial weight bearing LLE Partial Weight Bearing Percentage or Pounds: 25%   Prior Activity Level Community (5-7x/wk): Went out daily.  Goes to the gym M-W-F, walks in the mountains, tends garden, and attends senior center lecture series Tues and [employer].  Home Assistive Devices / Equipment Home Assistive Devices/Equipment: Eyeglasses  Prior Functional Level Prior Function Level of Independence: Independent  Current Functional Level Cognition  Overall Cognitive Status: Within Functional  Limits for tasks assessed Orientation Level: Oriented X4    Extremity Assessment (includes Sensation/Coordination)          ADLs  Overall ADL's : Needs assistance/impaired Lower Body Dressing: Sit to/from stand;Maximal assistance Toilet Transfer: Moderate assistance;BSC;Stand-pivot;Cueing for sequencing;Cueing for safety Toileting- Clothing Manipulation and Hygiene: Maximal assistance;Sit to/from stand;Cueing for sequencing;Cueing for safety Functional mobility during ADLs: Moderate assistance;Rolling walker    Mobility  Overal bed mobility: Needs Assistance Bed Mobility: Sit to Supine Supine to sit: Mod assist;HOB elevated Sit to supine: Mod assist General bed mobility comments: cues on technique, assistance for LLE back onto bed.    Transfers  Overall transfer level: Needs assistance Equipment used: Rolling walker (2 wheeled) Transfers: Sit to/from Stand Sit to Stand: Min assist (from recliner and BSC) Stand pivot transfers: Mod assist General transfer comment: cues for PWB, pivot steps to [facility] then took several steps backward to bed after toilet removed.    Ambulation / Gait / Stairs / Wheelchair Mobility  Ambulation/Gait Ambulation/Gait assistance: +2 safety/equipment;Mod assist Ambulation Distance (Feet): 5 Feet Assistive device: Rolling walker (2 wheeled) Gait  Pattern/deviations: Step-to pattern General Gait Details: cues for sequence and PWB.    Posture / Balance      Special needs/care consideration BiPAP/CPAP No CPM No Continuous Drip IV D5 and 0.9% NL 50 ml/hr Dialysis No        Life Vest No Oxygen No Special Bed No Trach Size No Wound Vac (area) No      Skin Sees a pediatrist for bunions on feet.  Has 3 dressings on left hip surgical sites.       Bowel mgmt: Last BM 12/23/13 Bladder mgmt:  Catheter out 12/25/13.  Using bedpan and BSC to void. Diabetic mgmt No    Previous Home Environment Living Arrangements: Spouse/significant other Home Care Services: No Additional Comments: visiting from [place] and fell.  Discharge Living Setting Plans for Discharge Living Setting: Patient's home;House;Lives with (comment) (Hopes to go home to [place] at discharge.) Type of Home at Discharge: Independent living facility ([facility] in [facility] community[facility] Discharge Home Layout: One level Discharge Home Access: Level entry (Is handicap accessible with pull light in bathroom.) Does the patient have any problems obtaining your medications?: No Currently staying with son.  Son's house is 2 levels with 3 step back entrance and 5 step front entrance. Can stay on main level in the den area if needed, but hopes to discharge to her own home in [place].  It is a 6 hr drive to patient's home.  Was down in [place] visiting son and grand children.  Social/Family/Support Systems Patient Roles: Spouse;Parent (Has husband and 2 sons.) Contact Information: Lawerance Sabal - spouse Anticipated Caregiver: Husband Anticipated Caregiver's Contact Information: Josph Macho [phone] Ability/Limitations of Caregiver: Husband retired and can assist.  He stays with patient a lot. Caregiver Availability: 24/7 Discharge Plan Discussed with Primary Caregiver: Yes Is Caregiver In Agreement with Plan?: Yes Does Caregiver/Family have Issues with  Lodging/Transportation while Pt is in Rehab?: No  Goals/Additional Needs Patient/Family Goal for Rehab: PT/OT mod I goals Expected length of stay: 7-10 days Cultural Considerations: Attends [employer] as it is similar to the [place].  Is from [place] and has been here for 50 years. Dietary Needs: Regular diet, thin liquids Equipment Needs: TBD Additional Information: One son lives in [place] and the other lives here in [place]. Pt/Family Agrees to Admission and willing to participate: Yes Program Orientation Provided & Reviewed with Pt/Caregiver Including  Roles  & Responsibilities: Yes  Decrease burden of Care through IP rehab admission:  N/A  Possible need for SNF placement upon discharge: Not planned  Patient Condition: This patient's condition remains as documented in the consult dated 12/25/13  , in which the Rehabilitation Physician determined and documented that the patient's condition is appropriate for intensive rehabilitative care in an inpatient rehabilitation facility. Will admit to inpatient rehab today.  Preadmission Screen Completed By:  Retta Diones, 12/25/2013 5:42 PM ______________________________________________________________________   Discussed status with Dr. Naaman Plummer on 12/26/13 at  1408  and received telephone approval for admission today.  Admission Coordinator:  Retta Diones, time 1408 Sudie Grumbling 12/26/13

## 2013-12-25 NOTE — Progress Notes (Signed)
Dr Wendee Beavers notified of low blood pressures, and low output, orders received  D Mateo Flow RN

## 2013-12-25 NOTE — Evaluation (Signed)
Occupational Therapy Evaluation Patient Details Name: Vanessa Page MRN: [id number] DOB: July 13, 1931 Today's Date: 12/25/2013    History of Present Illness S/p fall, sustained L IT femur fracture,  IM nail on 12/24/13.   Clinical Impression   Pt presents to OT with decreased I with ADL activity s/ fall in which pt sustained fracture. Pt will benefit from skilled OT to increase I with ADL activity and return to PLOF    Follow Up Recommendations  CIR    Equipment Recommendations  Other (comment) (TBD)       Precautions / Restrictions Precautions Precautions: Fall Restrictions LLE Weight Bearing: Partial weight bearing LLE Partial Weight Bearing Percentage or Pounds: 25%      Mobility Bed Mobility Overal bed mobility: Needs Assistance Bed Mobility: Supine to Sit     Supine to sit: Mod assist;HOB elevated     General bed mobility comments: pt used sheet to slide LLE to edge, cues for technique, extra time for working through PAIN.  Transfers Overall transfer level: Needs assistance Equipment used: Rolling walker (2 wheeled) Transfers: Sit to/from [facility] Sit to Stand: Mod assist Stand pivot transfers: Mod assist       General transfer comment: Cues for PWB, and technique.         ADL Overall ADL's : Needs assistance/impaired                     Lower Body Dressing: Sit to/from stand;Maximal assistance   Toilet Transfer: Moderate assistance;BSC;Stand-pivot;Cueing for sequencing;Cueing for safety   Toileting- Clothing Manipulation and Hygiene: Maximal assistance;Sit to/from stand;Cueing for sequencing;Cueing for safety       Functional mobility during ADLs: Moderate assistance;Rolling walker        Extremity/Trunk Assessment Upper Extremity Assessment Upper Extremity Assessment:  (Pt does report bursitis in R shoulder which is sometimes painful)   Lower Extremity Assessment Lower Extremity Assessment: LLE  deficits/detail LLE Deficits / Details: required assistance to move LLE using sheet around foot , mod assistance.        Communication Communication Communication: No difficulties   Cognition Arousal/Alertness: Awake/alert Behavior During Therapy: WFL for tasks assessed/performed Overall Cognitive Status: Within Functional Limits for tasks assessed                                Home Living Family/patient expects to be discharged to:: Inpatient rehab Living Arrangements: Spouse/significant other                               Additional Comments: visiting from [place] and fell.      Prior Functioning/Environment Level of Independence: Independent                OT Problem List: Decreased strength;Decreased safety awareness;Decreased activity tolerance;Impaired balance (sitting and/or standing);Decreased knowledge of precautions   OT Treatment/Interventions: Self-care/ADL training;DME and/or AE instruction;Patient/family education    OT Goals(Current goals can be found in the care plan section) Acute Rehab OT Goals Patient Stated Goal: I want to go to rehab for 2 weeks so I can walk. OT Goal Formulation: With patient Time For Goal Achievement: 01/08/14 Potential to Achieve Goals: Good  OT Frequency: Min 2X/week    End of Session    Activity Tolerance: Patient tolerated treatment well Patient left: in chair;with family/visitor present   Time: 7824-2353 OT Time Calculation (min): 33 min Charges:  OT General Charges $OT Visit: 1 Procedure OT Evaluation $Initial OT Evaluation Tier I: 1 Procedure OT Treatments $Self Care/Home Management : 23-37 mins G-Codes:    Betsy Pries 2014/01/22, 12:45 PM

## 2013-12-25 NOTE — Clinical Social Work Psychosocial (Signed)
Clinical Social Work Department BRIEF PSYCHOSOCIAL ASSESSMENT 12/25/2013  Patient:  Vanessa Page, Vanessa Page     Account Number:  [id number]     Admit date:  12/23/2013  Clinical Social Worker:  Daiva Huge  Date/Time:  12/25/2013 11:52 AM  Referred by:  Physician  Date Referred:  12/25/2013 Referred for  SNF Placement   Other Referral:   Interview type:  Other - See comment Other interview type:   Met with patient, her husband of 77 years and their daughter- in- law at bedside    PSYCHOSOCIAL DATA Living Status:  FAMILY Admitted from facility:   Level of care:   Primary support name:  Husband- Vanessa Page Primary support relationship to patient:  SPOUSE Degree of support available:   good    CURRENT CONCERNS Current Concerns  Post-Acute Placement   Other Concerns:    SOCIAL WORK ASSESSMENT / PLAN Patient and her husband were here visiting from [facility] when she fel lin the yard playing  ball with the grandkids- she has a great attitude about the accident and injury and is in good spirits sitting up in chair-  We have discussed possible plans including CIR, local SNF, home with daughter in law and son, as well as returning to the retirement community they live at in [place] which has rehab available- their wish is to consider the local rehab options (CIR or SNF/[facility]) first-   Assessment/plan status:  Other - See comment Other assessment/ plan:   FL2 and PASARR for local SNF search   Information/referral to community resources:   SNF list  EMS    PATIENT'S/FAMILY'S RESPONSE TO PLAN OF CARE: Patient and family are very appreciative of care and of all assistance being provided. they are open to pursuing CIR or SNF here -       Eduard Clos, MSW, [place]

## 2013-12-25 NOTE — Progress Notes (Signed)
Subjective: 1 Day Post-Op Procedure(s) (LRB): INTRAMEDULLARY (IM) NAIL INTERTROCHANTRIC (Left) Patient reports pain as mild.  No complaints.  Objective: Vital signs in last 24 hours: Temp:  [97.1 F (36.2 C)-98.3 F (36.8 C)] 98 F (36.7 C) (04/20 1003) Pulse Rate:  [65-88] 76 (04/20 1003) Resp:  [16-20] 18 (04/20 1003) BP: (92-105)/(53-66) 92/53 mmHg (04/20 1003) SpO2:  [97 %-100 %] 97 % (04/20 1003)  Intake/Output from previous day: 04/19 0701 - 04/20 0700 In: 2458.5 [P.O.:596; I.V.:1812.5; IV Piggyback:50] Out: 2175 [Urine:2100; Blood:75] Intake/Output this shift: Total I/O In: 240 [P.O.:240] Out: -    Recent Labs  12/23/13 1620 12/24/13 0512 12/25/13 0448  HGB 13.5 11.9* 11.0*    Recent Labs  12/24/13 0512 12/25/13 0448  WBC 16.0* 20.6*  RBC 4.03 3.69*  HCT 35.4* 32.7*  PLT 198 167    Recent Labs  12/24/13 0512 12/25/13 0448  NA 136* 138  K 4.5 4.4  CL 100 103  CO2 25 26  BUN 15 14  CREATININE 0.77 0.62  GLUCOSE 151* 168*  CALCIUM 8.2* 8.3*    Recent Labs  12/24/13 0512 12/25/13 0448  INR 1.96* 2.12*    Sensation intact distally Intact pulses distally Dorsiflexion/Plantar flexion intact Incision: dressing C/D/I Compartment soft  Assessment/Plan: 1 Day Post-Op Procedure(s) (LRB): INTRAMEDULLARY (IM) NAIL INTERTROCHANTRIC (Left) Up with therapy  Erskine Emery 12/25/2013, 10:06 AM

## 2013-12-25 NOTE — Evaluation (Signed)
Physical Therapy Evaluation Patient Details Name: Vanessa Page MRN: [id number] DOB: [birth year] Today's Date: 12/25/2013   History of Present Illness  S/p fall, sustained L IT femur fracture,  IM nail on 12/24/13.  Clinical Impression  Pt tolerated  Mobility well. Pt will benefit from PT in acute care to address problems listed. Pt will benefit from CIR as pt very mobie PTA.     Follow Up Recommendations CIR    Equipment Recommendations  Rolling walker with 5" wheels    Recommendations for Other Services       Precautions / Restrictions Precautions Precautions: Fall Restrictions Weight Bearing Restrictions: Yes LLE Weight Bearing: Partial weight bearing LLE Partial Weight Bearing Percentage or Pounds: 25%      Mobility  Bed Mobility Overal bed mobility: Needs Assistance Bed Mobility: Supine to Sit     Supine to sit: Mod assist;HOB elevated     General bed mobility comments: pt used sheet to slide LLE to edge, cues for technique, extra time for working through PAIN.  Transfers Overall transfer level: Needs assistance Equipment used: Rolling walker (2 wheeled) Transfers: Sit to/from [facility] Sit to Stand: Mod assist Stand pivot transfers: Mod assist       General transfer comment: Cues for PWB, and technique.  Ambulation/Gait Ambulation/Gait assistance: +2 safety/equipment;Mod assist Ambulation Distance (Feet): 5 Feet Assistive device: Rolling walker (2 wheeled) Gait Pattern/deviations: Step-to pattern     General Gait Details: cues for sequence and PWB.  Stairs            Wheelchair Mobility    Modified Rankin (Stroke Patients Only)       Balance                                             Pertinent Vitals/Pain 7 , premed, Ice  tp L hip    Home Living Family/patient expects to be discharged to:: Inpatient rehab Living Arrangements: Spouse/significant other               Additional  Comments: visiting from [place] and fell.    Prior Function Level of Independence: Independent               Hand Dominance        Extremity/Trunk Assessment   Upper Extremity Assessment: Overall WFL for tasks assessed           Lower Extremity Assessment: LLE deficits/detail   LLE Deficits / Details: required assistance to move LLE using sheet around foot , mod assistance.      Communication   Communication: No difficulties  Cognition Arousal/Alertness: Awake/alert Behavior During Therapy: WFL for tasks assessed/performed Overall Cognitive Status: Within Functional Limits for tasks assessed                      General Comments      Exercises        Assessment/Plan    PT Assessment Patient needs continued PT services  PT Diagnosis Difficulty walking;Acute pain   PT Problem List Decreased strength;Decreased range of motion;Decreased activity tolerance;Decreased mobility;Decreased knowledge of precautions;Decreased safety awareness;Decreased knowledge of use of DME;Pain  PT Treatment Interventions DME instruction;Gait training;Functional mobility training;Therapeutic activities;Therapeutic exercise;Patient/family education   PT Goals (Current goals can be found in the Care Plan section) Acute Rehab PT Goals Patient Stated Goal: I want to go to rehab for  2 weeks so I can walk. PT Goal Formulation: With patient/family Time For Goal Achievement: 01/01/14 Potential to Achieve Goals: Good    Frequency Min 5X/week   Barriers to discharge        Co-evaluation               End of Session   Activity Tolerance: Patient tolerated treatment well Patient left: in chair;with call bell/phone within reach;with family/visitor present Nurse Communication: Mobility status         Time: 0825-0915 PT Time Calculation (min): 50 min   Charges:   PT Evaluation $Initial PT Evaluation Tier I: 1 Procedure PT Treatments $Gait Training: 23-37  mins   PT G Codes:          Claretha Cooper 12/25/2013, 9:32 AM Tresa Endo PT [phone]

## 2013-12-25 NOTE — Progress Notes (Signed)
Rehab Admissions Coordinator Note:  Patient was screened by Cleatrice Burke for appropriateness for an Inpatient Acute Rehab Consult per PT recommendation.  At this time, we are recommending Inpatient Rehab consult. I will contact Dr. Wendee Beavers.    Audelia Acton [facility] 12/25/2013, 10:11 AM  I can be reached at [phone].

## 2013-12-25 NOTE — Clinical Social Work Psychosocial (Signed)
Clinical Social Work Department CLINICAL SOCIAL WORK PLACEMENT NOTE 12/25/2013  Patient:  Vanessa Page, Vanessa Page  Account Number:  [id number] Admit date:  12/23/2013  Clinical Social Worker:  Daiva Huge  Date/time:  12/25/2013 11:59 AM  Clinical Social Work is seeking post-discharge placement for this patient at the following level of care:   [place]   (*CSW will update this form in Epic as items are completed)   12/25/2013  Patient/family provided with [place] Department of Clinical Social Work's list of facilities offering this level of care within the geographic area requested by the patient (or if unable, by the patient's family).  12/25/2013  Patient/family informed of their freedom to choose among providers that offer the needed level of care, that participate in Medicare, Medicaid or managed care program needed by the patient, have an available bed and are willing to accept the patient.  12/25/2013  Patient/family informed of [facility]' ownership interest in [facility], as well as of the fact that they are under no obligation to receive care at this facility.  PASARR submitted to EDS on 12/25/2013 PASARR number received from EDS on   FL2 transmitted to all facilities in geographic area requested by pt/family on   FL2 transmitted to all facilities within larger geographic area on   Patient informed that his/her managed care company has contracts with or will negotiate with  certain facilities, including the following:     Patient/family informed of bed offers received:   Patient chooses bed at  Physician recommends and patient chooses bed at    Patient to be transferred to  on   Patient to be transferred to facility by   The following physician request were entered in Epic:   Additional Comments: Eduard Clos, MSW, [place]

## 2013-12-25 NOTE — Progress Notes (Signed)
Physical Therapy Treatment Patient Details Name: Vanessa Page MRN: [id number] DOB: 07-02-1931 Today's Date: 12/25/2013    History of Present Illness S/p fall, sustained L IT femur fracture,  IM nail on 12/24/13.    PT Comments    Pt  Continues to slowly improve with mobility. Recommend CIR. Pts BP pre 88/53, after return to bed 109/58. Pt does not report dizziness, feels medication making her feel  Different.  Follow Up Recommendations  CIR     Equipment Recommendations  Rolling walker with 5" wheels    Recommendations for Other Services       Precautions / Restrictions Precautions Precautions: Fall Restrictions Weight Bearing Restrictions: Yes LLE Weight Bearing: Partial weight bearing LLE Partial Weight Bearing Percentage or Pounds: 25%    Mobility  Bed Mobility   Bed Mobility: Sit to Supine       Sit to supine: Mod assist   General bed mobility comments: cues on technique, assistance for LLE back onto bed.  Transfers Overall transfer level: Needs assistance Equipment used: Rolling walker (2 wheeled) Transfers: Sit to/from Stand Sit to Stand: Min assist (from recliner and BSC) Stand pivot transfers: Mod assist       General transfer comment: cues for PWB, pivot steps to [facility] then took several steps backward to bed after toilet removed.  Ambulation/Gait                 Stairs            Wheelchair Mobility    Modified Rankin (Stroke Patients Only)       Balance                                    Cognition Arousal/Alertness: Awake/alert Behavior During Therapy: WFL for tasks assessed/performed Overall Cognitive Status: Within Functional Limits for tasks assessed                      Exercises General Exercises - Lower Extremity Ankle Circles/Pumps: AROM;Both;10 reps    General Comments        Pertinent Vitals/Pain L thigh, hip 5 with mobility,ice applied    Home Living Family/patient expects to  be discharged to:: Inpatient rehab Living Arrangements: Spouse/significant other             Additional Comments: visiting from [place] and fell.    Prior Function Level of Independence: Independent          PT Goals (current goals can now be found in the care plan section) Acute Rehab PT Goals Patient Stated Goal: I want to go to rehab for 2 weeks so I can walk. Progress towards PT goals: Progressing toward goals    Frequency  Min 5X/week    PT Plan Current plan remains appropriate    Co-evaluation             End of Session   Activity Tolerance: Patient tolerated treatment well Patient left: in bed;with call bell/phone within reach;with family/visitor present     Time: 0093-8182 PT Time Calculation (min): 26 min  Charges:  $Gait Training: 8-22 mins $Self Care/Home Management: 8-22                    G Codes:      Claretha Cooper 12/25/2013, 2:44 PM Tresa Endo PT [phone]

## 2013-12-25 NOTE — Progress Notes (Signed)
[facility] PROGRESS NOTE  Vanessa Page LGX:[id number] DOB: 1930/09/13 DOA: 12/23/2013 PCP: No primary provider on file.  Assessment/Plan: Active Problems:   Intertrochanteric fracture of left hip - Ortho on board and managing - Pt will require physical therapy evaluation and recommendations - Pt is s/p Open reduction and internal fixation of left  proximal femur fracture utilizing intramedullary hip screw  History of DVT of lower extremity/ Chronic anticoagulation - Pharmacy managing coumadin recommendations.    Essential hypertension - Will hold lisinopril given soft blood pressures  Leukocytosis - At this point suspected to be secondary to stress from recent operation. Patient afebrile in no source of infection identified on examination. Chest x-ray performed initially reported no acute findings. - Should patient develop any fevers we'll plan on obtaining blood cultures urine culture and working up further  Code Status: full Family Communication:discussed with patient and family at bedside Disposition Plan:  Per physical therapy recommendations patient to be evaluated for inpatient rehabilitation   Consultants:  Ortho: Dr. Ninfa Linden  Physical medicine consult  Procedures:  As listed above  Antibiotics:  Pt on Cefazolin  HPI/Subjective: No acute issues reported overnight. Daughter had questions about blood pressure. Husband had questions regarding disposition.  Objective: Filed Vitals:   12/25/13 1003  BP: 92/53  Pulse: 76  Temp: 98 F (36.7 C)  Resp: 18    Intake/Output Summary (Last 24 hours) at 12/25/13 1015 Last data filed at 12/25/13 0825  Gross per 24 hour  Intake 1498.5 ml  Output   1850 ml  Net -351.5 ml   Filed Weights   12/23/13 2300  Weight: 55.339 kg (122 lb)    Exam:   General:  Pt in NAD, alert and awake  Cardiovascular: RRR, no MRG  Respiratory: CTA BL, no wheezes  Abdomen: soft, NT, ND  Musculoskeletal: no  clubbing   Data Reviewed: Basic Metabolic Panel:  Recent Labs Lab 12/23/13 1620 12/24/13 0512 12/25/13 0448  NA 138 136* 138  K 3.8 4.5 4.4  CL 98 100 103  CO2 24 25 26   GLUCOSE 129* 151* 168*  BUN 20 15 14   CREATININE 0.89 0.77 0.62  CALCIUM 9.1 8.2* 8.3*  MG  --  1.9  --   PHOS  --  3.3  --    Liver Function Tests:  Recent Labs Lab 12/24/13 0512  AST 28  ALT 17  ALKPHOS 57  BILITOT 0.6  PROT 6.2  ALBUMIN 3.2*   No results found for this basename: LIPASE, AMYLASE,  in the last 168 hours No results found for this basename: AMMONIA,  in the last 168 hours CBC:  Recent Labs Lab 12/23/13 1620 12/24/13 0512 12/25/13 0448  WBC 8.9 16.0* 20.6*  NEUTROABS 5.0 13.5*  --   HGB 13.5 11.9* 11.0*  HCT 40.2 35.4* 32.7*  MCV 87.4 87.8 88.6  PLT 222 198 167   Cardiac Enzymes: No results found for this basename: CKTOTAL, CKMB, CKMBINDEX, TROPONINI,  in the last 168 hours BNP (last 3 results) No results found for this basename: PROBNP,  in the last 8760 hours CBG: No results found for this basename: GLUCAP,  in the last 168 hours  Recent Results (from the past 240 hour(s))  SURGICAL PCR SCREEN     Status: Abnormal   Collection Time    12/24/13  6:00 AM      Result Value Ref Range Status   MRSA, PCR NEGATIVE  NEGATIVE Final   Staphylococcus aureus POSITIVE (*) NEGATIVE Final  Comment:            The Xpert SA Assay (FDA     approved for NASAL specimens     in patients over 20 years of age),     is one component of     a comprehensive surveillance     program.  Test performance has     been validated by [employer] for patients greater     than or equal to 66 year old.     It is not intended     to diagnose infection nor to     guide or monitor treatment.     Studies: Dg Chest 1 View  12/23/2013   CLINICAL DATA:  Pre-op respiratory exam for left hip fracture.  EXAM: CHEST - 1 VIEW  COMPARISON:  None.  FINDINGS: The heart size and mediastinal contours  are within normal limits. No evidence of pulmonary infiltrate or pleural effusion. Thoracic dextroscoliosis noted.  IMPRESSION: No acute findings.   Electronically Signed   By: Earle Gell M.D.   On: 12/23/2013 17:06   Dg Hip Complete Left  12/23/2013   CLINICAL DATA:  [place], pain  EXAM: LEFT HIP - COMPLETE 2+ VIEW  COMPARISON:  None.  FINDINGS: There is a comminuted intertrochanteric fracture of the left hip which extends into the proximal femur. Significant medial angulation. No hip dislocation. No pelvic fracture. Soft tissue swelling. Vascular calcification.  IMPRESSION: Comminuted intertrochanteric fracture, left hip.   Electronically Signed   By: Rolla Flatten M.D.   On: 12/23/2013 17:04   Dg Femur Left  12/24/2013   CLINICAL DATA:  80 year old female undergoing treatment of left proximal femur fracture.  EXAM: LEFT FEMUR - 2 VIEW; DG C-ARM 1-60 MIN - NRPT [facility]  COMPARISON:  Preoperative films 12/23/2013.  FLUOROSCOPY TIME:  1 min 47 seconds.  FINDINGS: Six intraoperative fluoroscopic views of the left femur demonstrating left femur intra medullary rod placement with proximal interlocking dynamic hip screw and distal interlocking cortical screw. Hardware appears intact. Improved alignment of comminuted proximal femur fracture fragments.  IMPRESSION: ORIF left femur with no adverse features.   Electronically Signed   By: Lars Pinks M.D.   On: 12/24/2013 13:41   Dg C-arm 1-60 Min-no Report  12/24/2013   CLINICAL DATA:  80 year old female undergoing treatment of left proximal femur fracture.  EXAM: LEFT FEMUR - 2 VIEW; DG C-ARM 1-60 MIN - NRPT [facility]  COMPARISON:  Preoperative films 12/23/2013.  FLUOROSCOPY TIME:  1 min 47 seconds.  FINDINGS: Six intraoperative fluoroscopic views of the left femur demonstrating left femur intra medullary rod placement with proximal interlocking dynamic hip screw and distal interlocking cortical screw. Hardware appears intact. Improved alignment of comminuted proximal femur  fracture fragments.  IMPRESSION: ORIF left femur with no adverse features.   Electronically Signed   By: Lars Pinks M.D.   On: 12/24/2013 13:41    Scheduled Meds: . calcium carbonate  1 tablet Oral Q breakfast  . fluticasone  1 spray Each Nare Daily  . lisinopril  5 mg Oral BID  . multivitamin with minerals  1 tablet Oral Daily  . mupirocin ointment   Nasal BID  . senna  1 tablet Oral BID  . Warfarin - Pharmacist Dosing Inpatient   Does not apply q1800   Continuous Infusions: . dextrose 5 % and 0.9% NaCl 50 mL/hr at 12/24/13 1432     Time spent: > 35 minutes    Anjolina Byrer  [facility]  [facility] Pager [phone] If 7PM-7AM, please contact night-coverage at [email or web address], password [facility] 12/25/2013, 10:15 AM  LOS: 2 days

## 2013-12-26 ENCOUNTER — Encounter (HOSPITAL_COMMUNITY): Payer: Self-pay | Admitting: General Surgery

## 2013-12-26 ENCOUNTER — Inpatient Hospital Stay (HOSPITAL_COMMUNITY)
Admission: RE | Admit: 2013-12-26 | Discharge: 2014-01-05 | DRG: 945 | Disposition: A | Discharge status: Home Health | Payer: Medicare Other | Source: Intra-hospital | Attending: Physical Medicine & Rehabilitation | Admitting: Physical Medicine & Rehabilitation

## 2013-12-26 DIAGNOSIS — I959 Hypotension, unspecified: Secondary | ICD-10-CM | POA: Diagnosis present

## 2013-12-26 DIAGNOSIS — M25562 Pain in left knee: Secondary | ICD-10-CM | POA: Diagnosis not present

## 2013-12-26 DIAGNOSIS — D72829 Elevated white blood cell count, unspecified: Secondary | ICD-10-CM | POA: Diagnosis present

## 2013-12-26 DIAGNOSIS — R42 Dizziness and giddiness: Secondary | ICD-10-CM | POA: Diagnosis present

## 2013-12-26 DIAGNOSIS — D6859 Other primary thrombophilia: Secondary | ICD-10-CM | POA: Diagnosis present

## 2013-12-26 DIAGNOSIS — S72143A Displaced intertrochanteric fracture of unspecified femur, initial encounter for closed fracture: Secondary | ICD-10-CM | POA: Diagnosis present

## 2013-12-26 DIAGNOSIS — S72009A Fracture of unspecified part of neck of unspecified femur, initial encounter for closed fracture: Secondary | ICD-10-CM | POA: Diagnosis present

## 2013-12-26 DIAGNOSIS — I10 Essential (primary) hypertension: Secondary | ICD-10-CM

## 2013-12-26 DIAGNOSIS — J479 Bronchiectasis, uncomplicated: Secondary | ICD-10-CM | POA: Diagnosis present

## 2013-12-26 DIAGNOSIS — Z7901 Long term (current) use of anticoagulants: Secondary | ICD-10-CM

## 2013-12-26 DIAGNOSIS — T428X5A Adverse effect of antiparkinsonism drugs and other central muscle-tone depressants, initial encounter: Secondary | ICD-10-CM | POA: Diagnosis present

## 2013-12-26 DIAGNOSIS — Z86718 Personal history of other venous thrombosis and embolism: Secondary | ICD-10-CM

## 2013-12-26 DIAGNOSIS — D62 Acute posthemorrhagic anemia: Secondary | ICD-10-CM | POA: Diagnosis present

## 2013-12-26 DIAGNOSIS — W010XXA Fall on same level from slipping, tripping and stumbling without subsequent striking against object, initial encounter: Secondary | ICD-10-CM | POA: Diagnosis present

## 2013-12-26 DIAGNOSIS — K5900 Constipation, unspecified: Secondary | ICD-10-CM

## 2013-12-26 DIAGNOSIS — Z5189 Encounter for other specified aftercare: Principal | ICD-10-CM

## 2013-12-26 HISTORY — DX: Coagulation defect, unspecified: D68.9

## 2013-12-26 HISTORY — DX: Bursitis of right shoulder: M75.51

## 2013-12-26 HISTORY — DX: Bronchiectasis, uncomplicated: J47.9

## 2013-12-26 HISTORY — DX: Acute embolism and thrombosis of unspecified deep veins of unspecified lower extremity: I82.409

## 2013-12-26 LAB — BASIC METABOLIC PANEL
BUN: 16 mg/dL (ref 6–23)
CHLORIDE: 104 meq/L (ref 96–112)
CO2: 28 meq/L (ref 19–32)
Calcium: 8 mg/dL — ABNORMAL LOW (ref 8.4–10.5)
Creatinine, Ser: 0.59 mg/dL (ref 0.50–1.10)
GFR calc Af Amer: >90 mL/min (ref >90)
GFR calc non Af Amer: 84 mL/min — ABNORMAL LOW (ref >90)
Glucose, Bld: 113 mg/dL — ABNORMAL HIGH (ref 70–99)
Potassium: 4.1 mEq/L (ref 3.7–5.3)
SODIUM: 140 meq/L (ref 137–147)

## 2013-12-26 LAB — URINALYSIS, ROUTINE W REFLEX MICROSCOPIC
Bilirubin Urine: NEGATIVE
Glucose, UA: NEGATIVE mg/dL
Hgb urine dipstick: NEGATIVE
Ketones, ur: NEGATIVE mg/dL
NITRITE: NEGATIVE
PH: 6.5 (ref 5.0–8.0)
Protein, ur: NEGATIVE mg/dL
SPECIFIC GRAVITY, URINE: 1.027 (ref 1.005–1.030)
Urobilinogen, UA: 1 mg/dL (ref 0.0–1.0)

## 2013-12-26 LAB — CBC
HCT: 30.7 % — ABNORMAL LOW (ref 36.0–46.0)
Hemoglobin: 10.2 g/dL — ABNORMAL LOW (ref 12.0–15.0)
MCH: 29.7 pg (ref 26.0–34.0)
MCHC: 33.2 g/dL (ref 30.0–36.0)
MCV: 89.5 fL (ref 78.0–100.0)
Platelets: 161 10*3/uL (ref 150–400)
RBC: 3.43 MIL/uL — AB (ref 3.87–5.11)
RDW: 14.5 % (ref 11.5–15.5)
WBC: 19.3 10*3/uL — ABNORMAL HIGH (ref 4.0–10.5)

## 2013-12-26 LAB — URINE MICROSCOPIC-ADD ON

## 2013-12-26 LAB — PROTIME-INR
INR: 2.08 — AB (ref 0.00–1.49)
PROTHROMBIN TIME: 22.7 s — AB (ref 11.6–15.2)

## 2013-12-26 MED ORDER — PROCHLORPERAZINE MALEATE 5 MG PO TABS
5.0000 mg | ORAL_TABLET | Freq: Four times a day (QID) | ORAL | Status: DC | PRN
  Administered 2013-12-27 (×2): 5 mg via ORAL
  Filled 2013-12-26: qty 2

## 2013-12-26 MED ORDER — WARFARIN SODIUM 2 MG PO TABS
2.0000 mg | ORAL_TABLET | Freq: Once | ORAL | Status: DC
  Filled 2013-12-26: qty 1

## 2013-12-26 MED ORDER — PROCHLORPERAZINE 25 MG RE SUPP
12.5000 mg | Freq: Four times a day (QID) | RECTAL | Status: DC | PRN
  Filled 2013-12-26 (×2): qty 1

## 2013-12-26 MED ORDER — AZELASTINE HCL 0.1 % NA SOLN
2.0000 | Freq: Two times a day (BID) | NASAL | Status: DC | PRN
  Filled 2013-12-26: qty 30

## 2013-12-26 MED ORDER — CALCIUM CARBONATE 1250 (500 CA) MG PO TABS
1.0000 | ORAL_TABLET | Freq: Two times a day (BID) | ORAL | Status: DC
  Administered 2013-12-26 – 2014-01-05 (×20): 500 mg via ORAL
  Filled 2013-12-26 (×22): qty 1

## 2013-12-26 MED ORDER — BISACODYL 10 MG RE SUPP
10.0000 mg | Freq: Every day | RECTAL | Status: DC | PRN
  Administered 2013-12-27: 10 mg via RECTAL
  Filled 2013-12-26: qty 1

## 2013-12-26 MED ORDER — TRAZODONE HCL 50 MG PO TABS: 25.0000 mg | ORAL_TABLET | Freq: Every evening | ORAL | Status: DC | PRN

## 2013-12-26 MED ORDER — WARFARIN SODIUM 1 MG PO TABS: 1.0000 mg | ORAL_TABLET | Freq: Every day | ORAL | Status: DC

## 2013-12-26 MED ORDER — PROCHLORPERAZINE EDISYLATE 5 MG/ML IJ SOLN
5.0000 mg | Freq: Four times a day (QID) | INTRAMUSCULAR | Status: DC | PRN
  Filled 2013-12-26 (×2): qty 2

## 2013-12-26 MED ORDER — DIPHENHYDRAMINE HCL 12.5 MG/5ML PO ELIX: 12.5000 mg | ORAL_SOLUTION | Freq: Four times a day (QID) | ORAL | Status: DC | PRN

## 2013-12-26 MED ORDER — NON FORMULARY: 1.0000 [drp] | Freq: Three times a day (TID) | Status: DC | PRN

## 2013-12-26 MED ORDER — ALUM & MAG HYDROXIDE-SIMETH 200-200-20 MG/5ML PO SUSP: 30.0000 mL | ORAL | Status: DC | PRN

## 2013-12-26 MED ORDER — WARFARIN - PHARMACIST DOSING INPATIENT: Freq: Every day | Status: DC

## 2013-12-26 MED ORDER — ALBUTEROL SULFATE (2.5 MG/3ML) 0.083% IN NEBU: 2.5000 mg | INHALATION_SOLUTION | Freq: Four times a day (QID) | RESPIRATORY_TRACT | Status: DC | PRN

## 2013-12-26 MED ORDER — TRAMADOL HCL 50 MG PO TABS
50.0000 mg | ORAL_TABLET | Freq: Four times a day (QID) | ORAL | Status: DC | PRN
  Administered 2013-12-26 – 2014-01-04 (×6): 50 mg via ORAL
  Filled 2013-12-26 (×6): qty 1

## 2013-12-26 MED ORDER — SENNOSIDES-DOCUSATE SODIUM 8.6-50 MG PO TABS
2.0000 | ORAL_TABLET | Freq: Two times a day (BID) | ORAL | Status: DC
  Administered 2013-12-26 – 2014-01-05 (×17): 2 via ORAL
  Filled 2013-12-26 (×23): qty 2

## 2013-12-26 MED ORDER — SENNOSIDES-DOCUSATE SODIUM 8.6-50 MG PO TABS: 2.0000 | ORAL_TABLET | Freq: Every day | ORAL | Status: DC

## 2013-12-26 MED ORDER — GUAIFENESIN-DM 100-10 MG/5ML PO SYRP: 5.0000 mL | ORAL_SOLUTION | Freq: Four times a day (QID) | ORAL | Status: DC | PRN

## 2013-12-26 MED ORDER — ALBUTEROL SULFATE HFA 108 (90 BASE) MCG/ACT IN AERS: 1.0000 | INHALATION_SPRAY | Freq: Four times a day (QID) | RESPIRATORY_TRACT | Status: DC | PRN

## 2013-12-26 MED ORDER — OXYCODONE-ACETAMINOPHEN 5-325 MG PO TABS: 1.0000 | ORAL_TABLET | ORAL | Status: DC | PRN

## 2013-12-26 MED ORDER — ACETAMINOPHEN 325 MG PO TABS
325.0000 mg | ORAL_TABLET | ORAL | Status: DC | PRN
  Filled 2013-12-26: qty 2

## 2013-12-26 MED ORDER — FLEET ENEMA 7-19 GM/118ML RE ENEM: 1.0000 | ENEMA | Freq: Once | RECTAL | Status: AC | PRN

## 2013-12-26 MED ORDER — WARFARIN SODIUM 1 MG PO TABS
1.0000 mg | ORAL_TABLET | Freq: Once | ORAL | Status: AC
  Administered 2013-12-26: 1 mg via ORAL
  Filled 2013-12-26: qty 1

## 2013-12-26 NOTE — Clinical Social Work Note (Signed)
Patient and family are hopeful for CIR/Rehab transfer today- we have completed a SNF search for backup plan. CSW will follow-   Eduard Clos, MSW, [place]

## 2013-12-26 NOTE — Progress Notes (Signed)
Subjective: 2 Days Post-Op Procedure(s) (LRB): INTRAMEDULLARY (IM) NAIL INTERTROCHANTRIC (Left) Patient reports pain as mild.  However, quite painful when up with therapy.  Acute blood loss anemia, but only mild and asymptomatic from this.  Therapy recommending inpatient rehab.  She is back on her home coumadin dose.  Objective: Vital signs in last 24 hours: Temp:  [97.6 F (36.4 C)-98.3 F (36.8 C)] 97.8 F (36.6 C) (04/21 0254) Pulse Rate:  [74-80] 74 (04/21 0632) Resp:  [16-18] 18 (04/21 2706) BP: (87-129)/(50-67) 129/67 mmHg (04/21 2376) SpO2:  [89 %-97 %] 89 % (04/21 2831)  Intake/Output from previous day: 04/20 0701 - 04/21 0700 In: 1942.5 [P.O.:1080; I.V.:550; IV Piggyback:312.5] Out: 1525 [Urine:1525] Intake/Output this shift: Total I/O In: 240 [P.O.:240] Out: 825 [Urine:825]   Recent Labs  12/23/13 1620 12/24/13 0512 12/25/13 0448 12/26/13 0407  HGB 13.5 11.9* 11.0* 10.2*    Recent Labs  12/25/13 0448 12/26/13 0407  WBC 20.6* 19.3*  RBC 3.69* 3.43*  HCT 32.7* 30.7*  PLT 167 161    Recent Labs  12/25/13 0448 12/26/13 0407  NA 138 140  K 4.4 4.1  CL 103 104  CO2 26 28  BUN 14 16  CREATININE 0.62 0.59  GLUCOSE 168* 113*  CALCIUM 8.3* 8.0*    Recent Labs  12/25/13 0448 12/26/13 0407  INR 2.12* 2.08*    Sensation intact distally Intact pulses distally Dorsiflexion/Plantar flexion intact Incision: scant drainage No cellulitis present Compartment soft  Assessment/Plan: 2 Days Post-Op Procedure(s) (LRB): INTRAMEDULLARY (IM) NAIL INTERTROCHANTRIC (Left) Up with therapy; still only TDWB to up to 25% weight on left leg for the next 4 weeks minimum. OK to go to rehab from Ortho standpoint.  Mcarthur Rossetti 12/26/2013, 6:57 AM

## 2013-12-26 NOTE — H&P (Signed)
Physical Medicine and Rehabilitation Admission H&P  CC: Left femur fracture  HPI: Vanessa Page is a 80 y.o. female with history of HTN, DVT-chronic coumadin, Bronchiectasis; who tripped and fell while playing with her grand kids on 12/23/13. She developed immediate onset of left hip pain due to Left comminuted IT hip fracture. INR at admission 1.82 and coumadin held. On 12/24/13, patient underwent IM nailing of left hip by Dr. Ninfa Linden. CBC today with leucocytosis with WBC-20.6. Has had hypotension as well as poor po intake. Lisinopril held today. Post op TDWB up to 25% for 4 weeks and coumadin resumed    Review of Systems  HENT: Negative for hearing loss.  Eyes: Negative for blurred vision and double vision.  Respiratory: Positive for cough (chronic). Negative for shortness of breath and wheezing.  Cardiovascular: Negative for chest pain and palpitations.  Gastrointestinal: Positive for constipation (No BM since Sunday). Negative for heartburn, nausea and vomiting.  Genitourinary: Negative for urgency and frequency.  Musculoskeletal: Positive for joint pain (left hip) and myalgias (left hip/thigh).  Neurological: Positive for dizziness (due to meds?). Negative for headaches.  Psychiatric/Behavioral: Negative for memory loss. The patient is not nervous/anxious and does not have insomnia.   Past Medical History   Diagnosis  Date   .  Hypertension    .  Factor deficiency, coagulation      Patient reports Protein C deficiency   .  DVT (deep venous thrombosis)    .  Bursitis of right shoulder    .  Bronchiectasis     Past Surgical History   Procedure  Laterality  Date   .  Intramedullary (im) nail intertrochanteric  Left  12/24/2013     Procedure: INTRAMEDULLARY (IM) NAIL INTERTROCHANTRIC; Surgeon: Mcarthur Rossetti, MD; Location: [facility]; Service: Orthopedics; Laterality: Left;    No family history on file.  Social History: Married. Retired IT trainer. Independent without  AD. Active. Does not use alcohol, tobacco or illicit drugs.  Has a supportive family.  Allergies   Allergen  Reactions   .  Codeine     Medications Prior to Admission   Medication  Sig  Dispense  Refill   .  albuterol (PROVENTIL HFA;VENTOLIN HFA) 108 (90 BASE) MCG/ACT inhaler  Inhale 1 puff into the lungs every 6 (six) hours as needed for wheezing or shortness of breath.     Marland Kitchen  alendronate (FOSAMAX) 70 MG tablet  Take 70 mg by mouth once a week. Take with a full glass of water on an empty stomach.     Marland Kitchen  azelastine (ASTELIN) 137 MCG/SPRAY nasal spray  Place 2 sprays into both nostrils 2 (two) times daily as needed for allergies. Use in each nostril as directed     .  CALCIUM CARBONATE PO  Take 1 tablet by mouth daily.     Marland Kitchen  conjugated estrogens (PREMARIN) vaginal cream  Place 1 Applicatorful vaginally 3 (three) times a week.     .  fluticasone (FLONASE) 50 MCG/ACT nasal spray  Place into both nostrils daily.     Marland Kitchen  oxyCODONE-acetaminophen (ROXICET) 5-325 MG per tablet  Take 1-2 tablets by mouth every 4 (four) hours as needed for severe pain.  60 tablet  0   .  warfarin (COUMADIN) 1 MG tablet  Take 1 tablet (1 mg total) by mouth daily.  60 tablet  0   .  warfarin (COUMADIN) 2 MG tablet  Take 1-2 mg by mouth daily. Takes 1 mg(1/2 tablet) on Monday  and Friday and 2 mg (1 tablet) the rest of the week.      Home:  Home Living  Family/patient expects to be discharged to:: Inpatient rehab  Living Arrangements: Spouse/significant other  Additional Comments: visiting from [place] and fell.  Functional History:  Prior Function  Level of Independence: Independent    Functional Status:  Mobility:  Bed Mobility  Overal bed mobility: Needs Assistance  Bed Mobility: Sit to Supine  Supine to sit: Min assist;HOB elevated  Sit to supine: Mod assist  General bed mobility comments: assist for LLE; cues for technique  Transfers  Overall transfer level: Needs assistance  Equipment used: Rolling  walker (2 wheeled)  Transfers: Sit to/from Stand  Sit to Stand: Min assist  Stand pivot transfers: Min assist  General transfer comment: cues for TDWB and sequence. Assist to extend LLE during sit to stand  Ambulation/Gait  Ambulation/Gait assistance: +2 safety/equipment;Mod assist  Ambulation Distance (Feet): 5 Feet  Assistive device: Rolling walker (2 wheeled)  Gait Pattern/deviations: Step-to pattern  General Gait Details: cues for sequence and PWB.    ADL:  Min to mod assist  Cognition:  Cognition  Overall Cognitive Status: Within Functional Limits for tasks assessed  Orientation Level: Oriented X4  Cognition  Arousal/Alertness: Awake/alert  Behavior During Therapy: WFL for tasks assessed/performed  Overall Cognitive Status: Within Functional Limits for tasks assessed     Physical Exam:  Blood pressure 113/70, pulse 61, temperature 98.1 F (36.7 C), temperature source Oral, resp. rate 16, height 4' 11"  (1.499 m), SpO2 97.00%.   Constitutional: She is oriented to person, place, and time. She appears well-developed and well-nourished.  HENT:  Head: Normocephalic and atraumatic.  Eyes: Conjunctivae and EOM are normal. Pupils are equal, round, and reactive to light.  Neck: Normal range of motion.  Cardiovascular: Normal rate, regular rhythm and normal heart sounds.  Respiratory: Effort normal and breath sounds normal. No wheezes GI: Soft. Bowel sounds are normal. She exhibits no distension. There is no tenderness.  Musculoskeletal: Normal range of motion.  Neurological: She is alert and oriented to person, place, and time. She exhibits normal muscle tone.   Motor strength is 4/5 right deltoid 5/5 left deltoid 5/5 bilateral bicep triceps grip. 5/5 right hip flexor knee extensor ankle dorsiflexor plantar flexor 5/5 left ankle dorsiflexor plantar flexor. trace at the left hip flexor and 1+ knee extensor secondary to pain, left ankle 4/5. No sensory deficits. Pt with normal  memory, insight, and awareness.  Psychiatric: She has a normal mood and affect.    Results for orders placed during the hospital encounter of 12/23/13 (from the past 48 hour(s))   CBC Status: Abnormal    Collection Time    12/25/13 4:48 AM   Result  Value  Ref Range    WBC  20.6 (*)  4.0 - 10.5 K/uL    RBC  3.69 (*)  3.87 - 5.11 MIL/uL    Hemoglobin  11.0 (*)  12.0 - 15.0 g/dL    HCT  32.7 (*)  36.0 - 46.0 %    MCV  88.6  78.0 - 100.0 fL    MCH  29.8  26.0 - 34.0 pg    MCHC  33.6  30.0 - 36.0 g/dL    RDW  14.2  11.5 - 15.5 %    Platelets  167  150 - 400 K/uL   BASIC METABOLIC PANEL Status: Abnormal    Collection Time    12/25/13 4:48 AM  Result  Value  Ref Range    Sodium  138  137 - 147 mEq/L    Potassium  4.4  3.7 - 5.3 mEq/L    Chloride  103  96 - 112 mEq/L    CO2  26  19 - 32 mEq/L    Glucose, Bld  168 (*)  70 - 99 mg/dL    BUN  14  6 - 23 mg/dL    Creatinine, Ser  0.62  0.50 - 1.10 mg/dL    Calcium  8.3 (*)  8.4 - 10.5 mg/dL    GFR calc non Af Amer  83 (*)  >90 mL/min    GFR calc Af Amer  >90  >90 mL/min    Comment:  (NOTE)     The eGFR has been calculated using the CKD EPI equation.     This calculation has not been validated in all clinical situations.     eGFR's persistently <90 mL/min signify possible Chronic Kidney     Disease.   PROTIME-INR Status: Abnormal    Collection Time    12/25/13 4:48 AM   Result  Value  Ref Range    Prothrombin Time  23.1 (*)  11.6 - 15.2 seconds    INR  2.12 (*)  0.00 - 1.49   CBC Status: Abnormal    Collection Time    12/26/13 4:07 AM   Result  Value  Ref Range    WBC  19.3 (*)  4.0 - 10.5 K/uL    RBC  3.43 (*)  3.87 - 5.11 MIL/uL    Hemoglobin  10.2 (*)  12.0 - 15.0 g/dL    HCT  30.7 (*)  36.0 - 46.0 %    MCV  89.5  78.0 - 100.0 fL    MCH  29.7  26.0 - 34.0 pg    MCHC  33.2  30.0 - 36.0 g/dL    RDW  14.5  11.5 - 15.5 %    Platelets  161  150 - 400 K/uL   BASIC METABOLIC PANEL Status: Abnormal    Collection Time     12/26/13 4:07 AM   Result  Value  Ref Range    Sodium  140  137 - 147 mEq/L    Potassium  4.1  3.7 - 5.3 mEq/L    Chloride  104  96 - 112 mEq/L    CO2  28  19 - 32 mEq/L    Glucose, Bld  113 (*)  70 - 99 mg/dL    BUN  16  6 - 23 mg/dL    Creatinine, Ser  0.59  0.50 - 1.10 mg/dL    Calcium  8.0 (*)  8.4 - 10.5 mg/dL    GFR calc non Af Amer  84 (*)  >90 mL/min    GFR calc Af Amer  >90  >90 mL/min    Comment:  (NOTE)     The eGFR has been calculated using the CKD EPI equation.     This calculation has not been validated in all clinical situations.     eGFR's persistently <90 mL/min signify possible Chronic Kidney     Disease.   PROTIME-INR Status: Abnormal    Collection Time    12/26/13 4:07 AM   Result  Value  Ref Range    Prothrombin Time  22.7 (*)  11.6 - 15.2 seconds    INR  2.08 (*)  0.00 - 1.49    No results found.  Post Admission Physician Evaluation:  1. Functional deficits secondary to left intertrochanteric hip fx. 2. Patient is admitted to receive collaborative, interdisciplinary care between the physiatrist, rehab nursing staff, and therapy team. 3. Patient's level of medical complexity and substantial therapy needs in context of that medical necessity cannot be provided at a lesser intensity of care such as a SNF. 4. Patient has experienced substantial functional loss from his/her baseline which was documented above under the "Functional History" and "Functional Status" headings. Judging by the patient's diagnosis, physical exam, and functional history, the patient has potential for functional progress which will result in measurable gains while on inpatient rehab. These gains will be of substantial and practical use upon discharge in facilitating mobility and self-care at the household level. 5. Physiatrist will provide 24 hour management of medical needs as well as oversight of the therapy plan/treatment and provide guidance as appropriate regarding the interaction of the  two. 6. 24 hour rehab nursing will assist with bladder management, bowel management, safety, skin/wound care, disease management, medication administration, pain management and patient education and help integrate therapy concepts, techniques,education, etc. 7. PT will assess and treat for/with: Lower extremity strength, range of motion, stamina, balance, functional mobility, safety, adaptive techniques and equipment, ortho precautions, pain mgt, edema control, education. Goals are: supervision to mod I. 8. OT will assess and treat for/with: ADL's, functional mobility, safety, upper extremity strength, adaptive techniques and equipment, pain mgt, education, ortho precautions. Goals are: supervision to mod I. 9. SLP will assess and treat for/with: n/a. Goals are: n/a. 10. Case Management and Social Worker will assess and treat for psychological issues and discharge planning. 11. Team conference will be held weekly to assess progress toward goals and to determine barriers to discharge. 12. Patient will receive at least 3 hours of therapy per day at least 5 days per week. 13. ELOS: 10-13 days  14. Prognosis: excellent   Medical Problem List and Plan:  Left intertrochanteric hip fracture status post IM rod  1. Protein C deficiency/ H/o DVT /Anticoagulation: Pharmaceutical: Coumadin--INR goal 1.50 - 2.0 due to history of bleeding (eyes)  2. Pain Management: prn oxycodone. Reports some dizziness due to robaxin--will d/c.  3. Mood: Has positive outlook and motivated to get better. LCSW to follow for evaluation and input.  4. Neuropsych: This patient is capable of making decisions on his own behalf.  5. Hypotension: Hold lisinopril. Monitor orthostatic BP/pulse. Push po fluids.  6. Leucocytosis: Reactive?--will check UA/UCS.  7. ABLA: Will recheck in am.  8. Constipation: Increase Senna to bid. Fleets enema tomorrow if no results.   Meredith Staggers, MD, [place]   12/26/2013

## 2013-12-26 NOTE — Progress Notes (Signed)
RN called Caryl Pina, Rehab RN about family leaving cell phone in patients room at [facility]. RN to pass information onto family.

## 2013-12-26 NOTE — Progress Notes (Signed)
Patient arrived about 1510 to the unit. Patient and husband oriented to the unit and rehab process. Butterfly wish filled out. Discussed safety plan with patient. Patient in the recliner upon arrival. Vitals stable. Continue with plan of care.

## 2013-12-26 NOTE — Progress Notes (Signed)
RN called for [employer].   RN also called report to receiving RN, Caryl Pina on the Inpatient Rehab unit. All questions answered

## 2013-12-26 NOTE — Progress Notes (Deleted)
Patient requesting to use her Refresh Liquigel eyedrops. Wendee Beavers, MD informed RN that she may use these eye drops.

## 2013-12-26 NOTE — Progress Notes (Signed)
ANTICOAGULATION CONSULT NOTE - Follow-up Consult  Pharmacy Consult for Warfarin Indication: Hx DVT and post-op VTE prophylaxis  Allergies  Allergen Reactions  . Codeine     Patient Measurements: Height: 5' (152.4 cm) Weight: 122 lb (55.339 kg) IBW/kg (Calculated) : 45.5  Vital Signs: Temp: 97.8 F (36.6 C) (04/21 0632) Temp src: Oral (04/21 1497) BP: 129/67 mmHg (04/21 0263) Pulse Rate: 74 (04/21 0632)  Labs:  Recent Labs  12/23/13 1620 12/24/13 0512 12/25/13 0448 12/26/13 0407  HGB 13.5 11.9* 11.0* 10.2*  HCT 40.2 35.4* 32.7* 30.7*  PLT 222 198 167 161  APTT  --  35  --   --   LABPROT 20.5* 21.7* 23.1* 22.7*  INR 1.82* 1.96* 2.12* 2.08*  CREATININE 0.89 0.77 0.62 0.59    Estimated Creatinine Clearance: 43.7 ml/min (by C-G formula based on Cr of 0.59).   Medical History: Past Medical History  Diagnosis Date  . Hypertension     Medications:  Scheduled:  . calcium carbonate  1 tablet Oral Q breakfast  . fluticasone  1 spray Each Nare Daily  . multivitamin with minerals  1 tablet Oral Daily  . mupirocin ointment   Nasal BID  . senna  1 tablet Oral BID  . warfarin  2 mg Oral ONCE-1800  . Warfarin - Pharmacist Dosing Inpatient   Does not apply q1800   Infusions:    PRN: acetaminophen, acetaminophen, acetaminophen, acetaminophen, albuterol, alum & mag hydroxide-simeth, azelastine, guaiFENesin-dextromethorphan, HYDROcodone-acetaminophen, HYDROcodone-acetaminophen, menthol-cetylpyridinium, methocarbamol (ROBAXIN) IV, methocarbamol, methocarbamol, metoCLOPramide (REGLAN) injection, metoCLOPramide, morphine injection, morphine injection, ondansetron (ZOFRAN) IV, ondansetron (ZOFRAN) IV, ondansetron, ondansetron, phenol senna-docusate, traMADol  Assessment: 80 yo female on chronic warfarin for hx DVT, s/p ORIF of left femur fracture.   Home dose: 2mg  daily except 1mg  Mon/Fri   INR at goal 2.08 today  Goal of Therapy:  INR 2-3   Plan:   Give warfarin 2mg   tonight as patient takes at home  Daily PT/INR   Kizzie Furnish, PharmD Pager: [phone]  12/26/2013,9:33 AM

## 2013-12-26 NOTE — Progress Notes (Signed)
Inpatient Rehabilitation  I met with Mr. And Mrs. Figuereo at the bedside and they prefer for pt. to come to CIR for continuation of her rehab recovery.  I note that Dr. Ninfa Linden feels she is ready for CIR from his standpoint.  Also spoke with Dr. Wendee Beavers and he believes pt. to be ready from his standpoint.  Will await his clearance and DC orders.  Please call if questions.  [facility] Admissions Coordinator Cell [phone] Office [phone]

## 2013-12-26 NOTE — Discharge Instructions (Signed)
Only touch down to 25% weight on left leg until further notice. You can get your incisions wet in the shower. New dry dressing daily left upper leg incisions. Resume your home coumadin dosing. You need to follow-up with an Orthopedic Surgeon in [place], [place] in at least 2 weeks post-surgery.

## 2013-12-26 NOTE — Discharge Summary (Signed)
Physician Discharge Summary  Vanessa Page JOA:[id number] DOB: [birth year] DOA: 12/23/2013  PCP: No primary provider on file.  Admit date: 12/23/2013 Discharge date: 12/26/2013  Time spent: > 35 minutes  Recommendations for Outpatient Follow-up:  Patient will need continue physical therapy Also hold lisinopril given soft blood pressures No source of infection identified. Continue to monitor WBC levels.  Discharge Diagnoses:  Active Problems:   Intertrochanteric fracture of left hip   Hip fracture   Chronic anticoagulation   History of DVT of lower extremity   Bronchiectasis   Essential hypertension   Leukocytosis, unspecified   Discharge Condition: stable  Diet recommendation: Regular diet  Filed Weights   12/23/13 2300  Weight: 55.339 kg (122 lb)    History of present illness:  Vanessa Page is a pleasant 80 year old female with a history of DVT on Coumadin/bronchiectasis/essential hypertension comes in after she fell while playing with her grand kids, resulting in a left intertrochanteric fracture  Hospital Course:   Active Problems:  Intertrochanteric fracture of left hip  - Ortho on board and managing  - Pt will require physical therapy and recommendations are for inpatient rehab - Pt is s/p Open reduction and internal fixation of left  proximal femur fracture utilizing intramedullary hip screw   History of DVT of lower extremity/ Chronic anticoagulation  - Coumadin to be continued at home regimen.  Essential hypertension  - Will hold lisinopril given soft blood pressures   Leukocytosis  - At this point suspected to be secondary to stress from recent operation. Patient afebrile in no source of infection identified on examination. Chest x-ray performed initially reported no acute findings.  - Should patient develop any fevers would recommend further work up.  Procedures:  As listed above  Consultations:  Physical medicine and rehabilitation: Dr.  Letta Pate  Ortho: Dr. Ninfa Linden  Discharge Exam: Filed Vitals:   12/26/13 0800  BP:   Pulse:   Temp:   Resp: 14    General: Pt in NAD, alert and awake Cardiovascular: RRR, no MRG Respiratory: No increased wob, breathing comfortably on room air.  Discharge Instructions You were cared for by a hospitalist during your hospital stay. If you have any questions about your discharge medications or the care you received while you were in the hospital after you are discharged, you can call the unit and asked to speak with the hospitalist on call if the hospitalist that took care of you is not available. Once you are discharged, your primary care physician will handle any further medical issues. Please note that NO REFILLS for any discharge medications will be authorized once you are discharged, as it is imperative that you return to your primary care physician (or establish a relationship with a primary care physician if you do not have one) for your aftercare needs so that they can reassess your need for medications and monitor your lab values.  Discharge Orders   Future Orders Complete By Expires   Call MD for:  severe uncontrolled pain  As directed    Call MD for:  temperature >100.4  As directed    Diet - low sodium heart healthy  As directed    Increase activity slowly  As directed    Touch down weight bearing  As directed    Questions:     Laterality:  left   Extremity:  Lower       Medication List    STOP taking these medications       lisinopril  5 MG tablet  Commonly known as:  PRINIVIL,ZESTRIL      TAKE these medications       albuterol 108 (90 BASE) MCG/ACT inhaler  Commonly known as:  PROVENTIL HFA;VENTOLIN HFA  Inhale 1 puff into the lungs every 6 (six) hours as needed for wheezing or shortness of breath.     alendronate 70 MG tablet  Commonly known as:  FOSAMAX  Take 70 mg by mouth once a week. Take with a full glass of water on an empty stomach.     azelastine  137 MCG/SPRAY nasal spray  Commonly known as:  ASTELIN  Place 2 sprays into both nostrils 2 (two) times daily as needed for allergies. Use in each nostril as directed     CALCIUM CARBONATE PO  Take 1 tablet by mouth daily.     conjugated estrogens vaginal cream  Commonly known as:  PREMARIN  Place 1 Applicatorful vaginally 3 (three) times a week.     fluticasone 50 MCG/ACT nasal spray  Commonly known as:  FLONASE  Place into both nostrils daily.     oxyCODONE-acetaminophen 5-325 MG per tablet  Commonly known as:  ROXICET  Take 1-2 tablets by mouth every 4 (four) hours as needed for severe pain.     warfarin 2 MG tablet  Commonly known as:  COUMADIN  Take 1-2 mg by mouth daily. Takes 1 mg(1/2 tablet) on Monday and Friday and 2 mg (1 tablet) the rest of the week.     warfarin 1 MG tablet  Commonly known as:  COUMADIN  Take 1 tablet (1 mg total) by mouth daily.       Allergies  Allergen Reactions  . Codeine        Follow-up Information   Schedule an appointment as soon as possible for a visit in 2 weeks to follow up.       The results of significant diagnostics from this hospitalization (including imaging, microbiology, ancillary and laboratory) are listed below for reference.    Significant Diagnostic Studies: Dg Chest 1 View  12/23/2013   CLINICAL DATA:  Pre-op respiratory exam for left hip fracture.  EXAM: CHEST - 1 VIEW  COMPARISON:  None.  FINDINGS: The heart size and mediastinal contours are within normal limits. No evidence of pulmonary infiltrate or pleural effusion. Thoracic dextroscoliosis noted.  IMPRESSION: No acute findings.   Electronically Signed   By: Earle Gell M.D.   On: 12/23/2013 17:06   Dg Hip Complete Left  12/23/2013   CLINICAL DATA:  [place], pain  EXAM: LEFT HIP - COMPLETE 2+ VIEW  COMPARISON:  None.  FINDINGS: There is a comminuted intertrochanteric fracture of the left hip which extends into the proximal femur. Significant medial angulation. No  hip dislocation. No pelvic fracture. Soft tissue swelling. Vascular calcification.  IMPRESSION: Comminuted intertrochanteric fracture, left hip.   Electronically Signed   By: Rolla Flatten M.D.   On: 12/23/2013 17:04   Dg Femur Left  12/24/2013   CLINICAL DATA:  80 year old female undergoing treatment of left proximal femur fracture.  EXAM: LEFT FEMUR - 2 VIEW; DG C-ARM 1-60 MIN - NRPT [facility]  COMPARISON:  Preoperative films 12/23/2013.  FLUOROSCOPY TIME:  1 min 47 seconds.  FINDINGS: Six intraoperative fluoroscopic views of the left femur demonstrating left femur intra medullary rod placement with proximal interlocking dynamic hip screw and distal interlocking cortical screw. Hardware appears intact. Improved alignment of comminuted proximal femur fracture fragments.  IMPRESSION: ORIF left femur with no  adverse features.   Electronically Signed   By: Lars Pinks M.D.   On: 12/24/2013 13:41   Dg C-arm 1-60 Min-no Report  12/24/2013   CLINICAL DATA:  80 year old female undergoing treatment of left proximal femur fracture.  EXAM: LEFT FEMUR - 2 VIEW; DG C-ARM 1-60 MIN - [facility]PT [facility]  COMPARISON:  Preoperative films 12/23/2013.  FLUOROSCOPY TIME:  1 min 47 seconds.  FINDINGS: Six intraoperative fluoroscopic views of the left femur demonstrating left femur intra medullary rod placement with proximal interlocking dynamic hip screw and distal interlocking cortical screw. Hardware appears intact. Improved alignment of comminuted proximal femur fracture fragments.  IMPRESSION: ORIF left femur with no adverse features.   Electronically Signed   By: Lars Pinks M.D.   On: 12/24/2013 13:41    Microbiology: Recent Results (from the past 240 hour(s))  SURGICAL PCR SCREEN     Status: Abnormal   Collection Time    12/24/13  6:00 AM      Result Value Ref Range Status   MRSA, PCR NEGATIVE  NEGATIVE Final   Staphylococcus aureus POSITIVE (*) NEGATIVE Final   Comment:            The Xpert SA Assay (FDA     approved for NASAL  specimens     in patients over 71 years of age),     is one component of     a comprehensive surveillance     program.  Test performance has     been validated by [employer] for patients greater     than or equal to 69 year old.     It is not intended     to diagnose infection nor to     guide or monitor treatment.     Labs: Basic Metabolic Panel:  Recent Labs Lab 12/23/13 1620 12/24/13 0512 12/25/13 0448 12/26/13 0407  NA 138 136* 138 140  K 3.8 4.5 4.4 4.1  CL 98 100 103 104  CO2 24 25 26 28   GLUCOSE 129* 151* 168* 113*  BUN 20 15 14 16   CREATININE 0.89 0.77 0.62 0.59  CALCIUM 9.1 8.2* 8.3* 8.0*  MG  --  1.9  --   --   PHOS  --  3.3  --   --    Liver Function Tests:  Recent Labs Lab 12/24/13 0512  AST 28  ALT 17  ALKPHOS 57  BILITOT 0.6  PROT 6.2  ALBUMIN 3.2*   No results found for this basename: LIPASE, AMYLASE,  in the last 168 hours No results found for this basename: AMMONIA,  in the last 168 hours CBC:  Recent Labs Lab 12/23/13 1620 12/24/13 0512 12/25/13 0448 12/26/13 0407  WBC 8.9 16.0* 20.6* 19.3*  NEUTROABS 5.0 13.5*  --   --   HGB 13.5 11.9* 11.0* 10.2*  HCT 40.2 35.4* 32.7* 30.7*  MCV 87.4 87.8 88.6 89.5  PLT 222 198 167 161   Cardiac Enzymes: No results found for this basename: CKTOTAL, CKMB, CKMBINDEX, TROPONINI,  in the last 168 hours BNP: BNP (last 3 results) No results found for this basename: PROBNP,  in the last 8760 hours CBG: No results found for this basename: GLUCAP,  in the last 168 hours     Signed:  Velvet Bathe  [facility] 12/26/2013, 12:59 PM

## 2013-12-26 NOTE — Progress Notes (Signed)
Occupational Therapy Treatment Patient Details Name: Vanessa Page MRN: [id number] DOB: 1931/06/25 Today's Date: 12/26/2013    History of present illness S/p fall, sustained L IT femur fracture,  IM nail on 12/24/13.   OT comments  Pt progressing with OT.  Pt had 2/4 dyspnea after transferring to commode but recovered quickly.  Very motivated.  Would tolerate and benefit from CIR for further rehab.    Follow Up Recommendations  CIR    Equipment Recommendations  3 in 1 bedside comode    Recommendations for Other Services      Precautions / Restrictions Precautions Precautions: Fall Precaution Comments: TDWB, L 25% weight bearing Restrictions Weight Bearing Restrictions: Yes LLE Weight Bearing: Partial weight bearing LLE Partial Weight Bearing Percentage or Pounds: 25%       Mobility Bed Mobility   Bed Mobility: Sit to Supine     Supine to sit: Min assist;HOB elevated     General bed mobility comments: assist for LLE; cues for technique  Transfers Overall transfer level: Needs assistance Equipment used: Rolling walker (2 wheeled) Transfers: Sit to/from Stand Sit to Stand: Min assist Stand pivot transfers: Min assist       General transfer comment: cues for TDWB and sequence.  Assist to extend LLE during sit to stand    Balance                                   ADL Overall ADL's : Needs assistance/impaired             Lower Body Bathing: Minimal assistance;Sit to/from stand;With adaptive equipment       Lower Body Dressing: Moderate assistance;Sit to/from stand;With adaptive equipment   Toilet Transfer: Minimal assistance;Stand-pivot;BSC   Toileting- Clothing Manipulation and Hygiene: Maximal assistance;Sit to/from stand       Functional mobility during ADLs: Minimal assistance;Rolling walker General ADL Comments: Pt has difficulty advancing LLE for sit to stand; cues given for techique.  Transferred to chair, then commode, then  back to chair.  Educated on AE and practiced with reacher and sock aide on RLE.  Did not use on L due to incresed pain as session progressed.  Pt unable to perform hygiene in standing.      Vision                     Perception     Praxis      Cognition   Behavior During Therapy: [facility] for tasks assessed/performed Overall Cognitive Status: Within Functional Limits for tasks assessed                       Extremity/Trunk Assessment               Exercises     Shoulder Instructions       General Comments      Pertinent Vitals/ Pain       8/10 L hip; repositioned with ice.  Pt was premedicated.  2/4 dyspnea transferring to commode; recovered quickly  Home Living                                          Prior Functioning/Environment              Frequency Min 2X/week     Progress  Toward Goals  OT Goals(current goals can now be found in the care plan section)  Progress towards OT goals: Progressing toward goals     Plan Discharge plan remains appropriate    Co-evaluation                 End of Session Equipment Utilized During Treatment: Rolling walker   Activity Tolerance Patient tolerated treatment well   Patient Left in chair;with family/visitor present   Nurse Communication          Time: [phone] OT Time Calculation (min): 39 min  Charges: OT General Charges $OT Visit: 1 Procedure OT Treatments $Self Care/Home Management : 38-52 mins  Jean Skow 12/26/2013, 10:00 AM  Lesle Chris, OTR/L [phone] 12/26/2013

## 2013-12-26 NOTE — Progress Notes (Signed)
ANTICOAGULATION CONSULT NOTE - Initial Consult  Pharmacy Consult for Warfarin Indication: Hx DVT/Protein C Deficiency  Allergies  Allergen Reactions  . Codeine     Patient Measurements: Height: 4\' 11"  (149.9 cm) IBW/kg (Calculated) : 43.2  Vital Signs: Temp: 98.1 F (36.7 C) (04/21 1510) Temp src: Oral (04/21 1510) BP: 113/70 mmHg (04/21 1510) Pulse Rate: 61 (04/21 1510)  Labs:  Recent Labs  12/24/13 0512 12/25/13 0448 12/26/13 0407  HGB 11.9* 11.0* 10.2*  HCT 35.4* 32.7* 30.7*  PLT 198 167 161  APTT 35  --   --   LABPROT 21.7* 23.1* 22.7*  INR 1.96* 2.12* 2.08*  CREATININE 0.77 0.62 0.59    The CrCl is unknown because both a height and weight (above a minimum accepted value) are required for this calculation.   Medical History: Past Medical History  Diagnosis Date  . Hypertension   . 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 DVT (deep venous thrombosis)   . Bursitis of right shoulder   . Bronchiectasis     Assessment: 80 y.o. F on chronic warfarin PTA for hx DVT/protein C deficiency. Warfarin was held on admission to [facility] and the patient underwent L-ORIF on 12/24/13. Warfarin was resumed that evening. The patient has now been transferred to [facility] inpatient rehab and pharmacy has been re-consulted to dose warfarin.  It is noted that the patient's INR goal has changed from 2-3 (per [facility] dosing) to 1.5-2 (per our consult here at [facility]) per MD request due to recurrent ocular bleeding. Given the lower goal range - will reduce the dose ordered earlier today at [facility] from 2 mg to 1 mg. The patient did not receive a warfarin dose prior to transfer.   Goal of Therapy:  INR 1.5-2 (per MD - due to recurrent ocular bleeding)   Plan:  1. Warfarin 1 mg x 1 dose at 1800 today 2. Daily PT/INR 3. Will continue to monitor for any signs/symptoms of bleeding and will follow up with PT/INR in the a.m.   Alycia Rossetti, PharmD, BCPS Clinical  Pharmacist Pager: [phone] 12/26/2013 4:24 PM

## 2013-12-27 ENCOUNTER — Inpatient Hospital Stay (HOSPITAL_COMMUNITY): Payer: Medicare Other

## 2013-12-27 ENCOUNTER — Inpatient Hospital Stay (HOSPITAL_COMMUNITY): Payer: Medicare Other | Admitting: Occupational Therapy

## 2013-12-27 ENCOUNTER — Inpatient Hospital Stay (HOSPITAL_COMMUNITY): Payer: Medicare Other | Admitting: Physical Therapy

## 2013-12-27 DIAGNOSIS — S72143A Displaced intertrochanteric fracture of unspecified femur, initial encounter for closed fracture: Secondary | ICD-10-CM

## 2013-12-27 DIAGNOSIS — D72829 Elevated white blood cell count, unspecified: Secondary | ICD-10-CM

## 2013-12-27 DIAGNOSIS — I10 Essential (primary) hypertension: Secondary | ICD-10-CM

## 2013-12-27 LAB — COMPREHENSIVE METABOLIC PANEL
ALT: 10 U/L (ref 0–35)
AST: 25 U/L (ref 0–37)
Albumin: 2.6 g/dL — ABNORMAL LOW (ref 3.5–5.2)
Alkaline Phosphatase: 73 U/L (ref 39–117)
BUN: 15 mg/dL (ref 6–23)
CO2: 27 mEq/L (ref 19–32)
Calcium: 8.5 mg/dL (ref 8.4–10.5)
Chloride: 99 mEq/L (ref 96–112)
Creatinine, Ser: 0.59 mg/dL (ref 0.50–1.10)
GFR calc Af Amer: >90 mL/min (ref >90)
GFR calc non Af Amer: 84 mL/min — ABNORMAL LOW (ref >90)
Glucose, Bld: 107 mg/dL — ABNORMAL HIGH (ref 70–99)
Potassium: 3.9 mEq/L (ref 3.7–5.3)
Sodium: 138 mEq/L (ref 137–147)
Total Bilirubin: 0.4 mg/dL (ref 0.3–1.2)
Total Protein: 5.9 g/dL — ABNORMAL LOW (ref 6.0–8.3)

## 2013-12-27 LAB — CBC WITH DIFFERENTIAL/PLATELET
Basophils Absolute: 0 10*3/uL (ref 0.0–0.1)
Basophils Relative: 0 % (ref 0–1)
Eosinophils Absolute: 0.2 10*3/uL (ref 0.0–0.7)
Eosinophils Relative: 1 % (ref 0–5)
HCT: 34.3 % — ABNORMAL LOW (ref 36.0–46.0)
Hemoglobin: 11.3 g/dL — ABNORMAL LOW (ref 12.0–15.0)
Lymphocytes Relative: 17 % (ref 12–46)
Lymphs Abs: 2.8 10*3/uL (ref 0.7–4.0)
MCH: 29.5 pg (ref 26.0–34.0)
MCHC: 32.9 g/dL (ref 30.0–36.0)
MCV: 89.6 fL (ref 78.0–100.0)
Monocytes Absolute: 1.6 10*3/uL — ABNORMAL HIGH (ref 0.1–1.0)
Monocytes Relative: 10 % (ref 3–12)
Neutro Abs: 11.9 10*3/uL — ABNORMAL HIGH (ref 1.7–7.7)
Neutrophils Relative %: 72 % (ref 43–77)
Platelets: 188 10*3/uL (ref 150–400)
RBC: 3.83 MIL/uL — ABNORMAL LOW (ref 3.87–5.11)
RDW: 14.5 % (ref 11.5–15.5)
WBC: 16.6 10*3/uL — ABNORMAL HIGH (ref 4.0–10.5)

## 2013-12-27 LAB — PROTIME-INR
INR: 1.67 — ABNORMAL HIGH (ref 0.00–1.49)
Prothrombin Time: 19.2 seconds — ABNORMAL HIGH (ref 11.6–15.2)

## 2013-12-27 MED ORDER — WARFARIN SODIUM 2 MG PO TABS
2.0000 mg | ORAL_TABLET | Freq: Once | ORAL | Status: AC
  Administered 2013-12-27: 2 mg via ORAL
  Filled 2013-12-27: qty 1

## 2013-12-27 MED ORDER — HYDROCODONE-ACETAMINOPHEN 5-325 MG PO TABS
1.0000 | ORAL_TABLET | ORAL | Status: DC | PRN
  Administered 2013-12-27 – 2014-01-05 (×22): 1 via ORAL
  Filled 2013-12-27 (×22): qty 1

## 2013-12-27 MED ORDER — FLEET ENEMA 7-19 GM/118ML RE ENEM: 1.0000 | ENEMA | Freq: Every day | RECTAL | Status: DC | PRN

## 2013-12-27 NOTE — Progress Notes (Signed)
80 y.o. female with history of HTN, DVT-chronic coumadin, Bronchiectasis; who tripped and fell while playing with her grand kids on 12/23/13. She developed immediate onset of left hip pain due to Left comminuted IT hip fracture. INR at admission 1.82 and coumadin held. On 12/24/13, patient underwent IM nailing of left hip by Dr. Ninfa Linden. CBC today with leucocytosis with WBC-20.6. Has had hypotension as well as poor po intake. Lisinopril held today. Post op TDWB up to 25% for 4 weeks and coumadin resumed   Subjective/Complaints: Some pain last noc releived by meds Pain meds make her feel "woozy" Review of Systems - Negative except constipation, no BM since admit Objective: Vital Signs: Blood pressure 150/76, pulse 78, temperature 98.2 F (36.8 C), temperature source Oral, resp. rate 18, height _0  (1.499 m), weight 55.6 kg (122 lb 9.2 oz), SpO2 93.00%. No results found. Results for orders placed during the hospital encounter of 12/26/13 (from the past 72 hour(s))  URINALYSIS, ROUTINE W REFLEX MICROSCOPIC     Status: Abnormal   Collection Time    12/26/13 10:32 PM      Result Value Ref Range   Color, Urine YELLOW  YELLOW   APPearance CLEAR  CLEAR   Specific Gravity, Urine 1.027  1.005 - 1.030   pH 6.5  5.0 - 8.0   Glucose, UA NEGATIVE  NEGATIVE mg/dL   Hgb urine dipstick NEGATIVE  NEGATIVE   Bilirubin Urine NEGATIVE  NEGATIVE   Ketones, ur NEGATIVE  NEGATIVE mg/dL   Protein, ur NEGATIVE  NEGATIVE mg/dL   Urobilinogen, UA 1.0  0.0 - 1.0 mg/dL   Nitrite NEGATIVE  NEGATIVE   Leukocytes, UA SMALL (*) NEGATIVE  URINE MICROSCOPIC-ADD ON     Status: Abnormal   Collection Time    12/26/13 10:32 PM      Result Value Ref Range   Squamous Epithelial / LPF MANY (*) RARE   WBC, UA 3-6  <3 WBC/hpf   Bacteria, UA RARE  RARE  COMPREHENSIVE METABOLIC PANEL     Status: Abnormal   Collection Time    12/27/13  5:00 AM      Result Value Ref Range   Sodium 138  137 - 147 mEq/L   Potassium 3.9   3.7 - 5.3 mEq/L   Chloride 99  96 - 112 mEq/L   CO2 27  19 - 32 mEq/L   Glucose, Bld 107 (*) 70 - 99 mg/dL   BUN 15  6 - 23 mg/dL   Creatinine, Ser 0.59  0.50 - 1.10 mg/dL   Calcium 8.5  8.4 - 10.5 mg/dL   Total Protein 5.9 (*) 6.0 - 8.3 g/dL   Albumin 2.6 (*) 3.5 - 5.2 g/dL   AST 25  0 - 37 U/L   ALT 10  0 - 35 U/L   Alkaline Phosphatase 73  39 - 117 U/L   Total Bilirubin 0.4  0.3 - 1.2 mg/dL   GFR calc non Af Amer 84 (*) >90 mL/min   GFR calc Af Amer >90  >90 mL/min   Comment: (NOTE)     The eGFR has been calculated using the CKD EPI equation.     This calculation has not been validated in all clinical situations.     eGFR's persistently <90 mL/min signify possible Chronic Kidney     Disease.  CBC WITH DIFFERENTIAL     Status: Abnormal   Collection Time    12/27/13  5:00 AM      Result  Value Ref Range   WBC 16.6 (*) 4.0 - 10.5 K/uL   RBC 3.83 (*) 3.87 - 5.11 MIL/uL   Hemoglobin 11.3 (*) 12.0 - 15.0 g/dL   HCT 34.3 (*) 36.0 - 46.0 %   MCV 89.6  78.0 - 100.0 fL   MCH 29.5  26.0 - 34.0 pg   MCHC 32.9  30.0 - 36.0 g/dL   RDW 14.5  11.5 - 15.5 %   Platelets 188  150 - 400 K/uL   Neutrophils Relative % 72  43 - 77 %   Neutro Abs 11.9 (*) 1.7 - 7.7 K/uL   Lymphocytes Relative 17  12 - 46 %   Lymphs Abs 2.8  0.7 - 4.0 K/uL   Monocytes Relative 10  3 - 12 %   Monocytes Absolute 1.6 (*) 0.1 - 1.0 K/uL   Eosinophils Relative 1  0 - 5 %   Eosinophils Absolute 0.2  0.0 - 0.7 K/uL   Basophils Relative 0  0 - 1 %   Basophils Absolute 0.0  0.0 - 0.1 K/uL  PROTIME-INR     Status: Abnormal   Collection Time    12/27/13  5:00 AM      Result Value Ref Range   Prothrombin Time 19.2 (*) 11.6 - 15.2 seconds   INR 1.67 (*) 0.00 - 1.49     HEENT: normal Cardio: RRR and no murmur Resp: CTA B/L and unlabored GI: BS positive and non distended Extremity:  Pulses positive and No Edema Skin:   Intact and Wound C/D/I and dried blood on middle incision site Neuro: Alert/Oriented and  Abnormal Motor 2-/5 Left hip and KE, 4/5 ankle, 5/5 RLE, 5/5 in BUE Musc/Skel:  Normal and Extremity tender Left hip Gen NAD   Assessment/Plan: 1. Functional deficits secondary to Left comminuted IT hip fx which require 3+ hours per day of interdisciplinary therapy in a comprehensive inpatient rehab setting. Physiatrist is providing close team supervision and 24 hour management of active medical problems listed below. Physiatrist and rehab team continue to assess barriers to discharge/monitor patient progress toward functional and medical goals. FIM:       FIM - Toileting Toileting steps completed by patient: Performs perineal hygiene           Comprehension Comprehension Mode: Auditory Comprehension: 6-Follows complex conversation/direction: With extra time/assistive device  Expression Expression Mode: Verbal Expression: 7-Expresses complex ideas: With no assist  Social Interaction Social Interaction: 7-Interacts appropriately with others - No medications needed.  Problem Solving Problem Solving: 6-Solves complex problems: With extra time  Memory Memory: 6-More than reasonable amt of time  Medical Problem List and Plan:  Left comminuted intertrochanteric hip fracture status post IM rod  1. Protein C deficiency/ H/o DVT /Anticoagulation: Pharmaceutical: Coumadin--INR goal 1.50 - 2.0 due to history of bleeding (eyes)  2. Pain Management: prn oxycodone. Reports some dizziness due to robaxin--will d/c.  3. Mood: Has positive outlook and motivated to get better. LCSW to follow for evaluation and input.  4. Neuropsych: This patient is capable of making decisions on his own behalf.  5. Hypotension: Hold lisinopril. Monitor orthostatic BP/pulse. Push po fluids.  6. Leucocytosis: Resolving last WBC 16K-- UAneg  7. ABLA: Will recheck in am.  8. Constipation: Increase Senna to bid. Fleets enema today if no results.   LOS (Days) 1 A FACE TO FACE EVALUATION WAS  PERFORMED  Charlett Blake 12/27/2013, 7:16 AM

## 2013-12-27 NOTE — Progress Notes (Signed)
Inpatient [facility] Individual Statement of Services  Patient Name:  Sabrie Moritz  Date:  12/27/2013  Welcome to the [facility].  Our goal is to provide you with an individualized program based on your diagnosis and situation, designed to meet your specific needs.  With this comprehensive rehabilitation program, you will be expected to participate in at least 3 hours of rehabilitation therapies Monday-Friday, with modified therapy programming on the weekends.  Your rehabilitation program will include the following services:  Physical Therapy (PT), Occupational Therapy (OT), 24 hour per day rehabilitation nursing, Neuropsychology, Case Management (Social Worker), Rehabilitation Medicine, Nutrition Services and Pharmacy Services  Weekly team conferences will be held on Wednesdays to discuss your progress.  Your Social Worker will talk with you frequently to get your input and to update you on team discussions.  Team conferences with you and your family in attendance may also be held.  Expected length of stay:  10 days    Overall anticipated outcome: Supervision with some assistance needed  Depending on your progress and recovery, your program may change. Your Social Worker will coordinate services and will keep you informed of any changes. Your Social Worker's name and contact numbers are listed  below.  The following services may also be recommended but are not provided by the [facility] will be made to provide these services after discharge if needed.  Arrangements include referral to agencies that provide these services.  Your insurance has been verified to be:  Medicare and [place] Your primary doctor is:  Physician in [place], [place]  Pertinent information will be shared with your doctor and your insurance  company.  Social Worker:  Alfonse Alpers, LCSW  [phone] or [phone]  Information discussed with and copy given to patient by: Silvestre Mesi Reeshemah Nazaryan, 12/27/2013, 1:52 PM

## 2013-12-27 NOTE — Evaluation (Signed)
Physical Therapy Assessment and Plan  Patient Details  Name: Vanessa Page MRN: [id number] Date of Birth: 08/25/1931  PT Diagnosis: Difficulty walking, Edema, Muscle weakness and Pain in L leg Rehab Potential: Good ELOS: 10-12 days   Today's Date: 12/27/2013 Time: 9741-6384 and 1133-1203 Time Calculation (min): 57 min and 30 min  Problem List:  Patient Active Problem List   Diagnosis Date Noted  . Hip fracture requiring operative repair 12/26/2013  . Leukocytosis, unspecified 12/25/2013  . Intertrochanteric fracture of left hip 12/23/2013  . Hip fracture 12/23/2013  . Chronic anticoagulation 12/23/2013  . History of DVT of lower extremity 12/23/2013  . Bronchiectasis 12/23/2013  . Essential hypertension 12/23/2013    Past Medical History:  Past Medical History  Diagnosis Date  . Hypertension   . 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 DVT (deep venous thrombosis)   . Bursitis of right shoulder   . Bronchiectasis    Past Surgical History:  Past Surgical History  Procedure Laterality Date  . Intramedullary (im) nail intertrochanteric Left 12/24/2013    Procedure: INTRAMEDULLARY (IM) NAIL INTERTROCHANTRIC;  Surgeon: Mcarthur Rossetti, MD;  Location: [facility];  Service: Orthopedics;  Laterality: Left;    Assessment & Plan Clinical Impression: Patient is a 80 y.o. female with history of HTN, DVT-chronic coumadin, Bronchiectasis; who tripped and fell while playing with her grand kids on 12/23/13. She developed immediate onset of left hip pain due to Left comminuted IT hip fracture. INR at admission 1.82 and coumadin held. On 12/24/13, patient underwent IM nailing of left hip by Dr. Ninfa Linden. CBC today with leucocytosis with WBC-20.6. Has had hypotension as well as poor po intake.  Post op TDWB up to 25% for 4 weeks and coumadin resumed.  Patient transferred to [facility] on 12/26/2013.   Patient currently requires mod-max A with mobility  secondary to muscle weakness, decreased cardiorespiratoy endurance and decreased sitting balance, decreased standing balance and decreased balance strategies.  Prior to hospitalization, patient was independent  with mobility and lived with Spouse;Son (Lives in [place] with husband; visiting family in [place]. D/C to son's house initially) in a House (Son lives in multi-level house; pt and husband live in [place]) home.  Home access is 3Stairs to enter (Pt home at [facility] is level entry).  Patient will benefit from skilled PT intervention to maximize safe functional mobility, minimize fall risk and decrease caregiver burden for planned discharge home with 24 hour supervision.  Anticipate patient will benefit from follow up [place] at discharge.  PT - End of Session Activity Tolerance: Decreased this session Endurance Deficit: Yes Endurance Deficit Description: secondary to pain PT Assessment Rehab Potential: Good Barriers to Discharge: Inaccessible home environment (pt lives in [place], [place]; son's home has 3 STE, no rails) PT Patient demonstrates impairments in the following area(s): Balance;Edema;Endurance;Motor;Pain PT Transfers Functional Problem(s): Bed Mobility;Bed to Chair;Car;Furniture PT Locomotion Functional Problem(s): Ambulation;Stairs PT Plan PT Intensity: Minimum of 1-2 x/day ,45 to 90 minutes PT Frequency: 5 out of 7 days PT Duration Estimated Length of Stay: 10-12 days PT Treatment/Interventions: Ambulation/gait training;Balance/vestibular training;Discharge planning;DME/adaptive equipment instruction;Functional mobility training;Neuromuscular re-education;Pain management;Patient/family education;Skin care/wound management;Stair training;Therapeutic Activities;Therapeutic Exercise;UE/LE Strength taining/ROM PT Transfers Anticipated Outcome(s): Supervision PT Locomotion Anticipated Outcome(s): Supervision short distances with RW; no w/c goals secondary to R shoulder bursitis PT Recommendation Follow Up  Recommendations: Home health PT Patient destination: Home Equipment Recommended: Rolling walker with 5" wheels;Wheelchair (measurements);Wheelchair cushion (measurements)  Skilled Therapeutic Intervention Following PT evaluation pt  returned to room and performed transfer w/c > bed stand pivot with RW with mod-max A secondary to increased pain and fatigue with lifting assistance required from w/c and verbal cues for safe pivot sequence to bed.  Pt required assistance to doff shoes secondary to inability to flex forwards.  Required mod-max A for sit > supine on flat bed with assistance to bring bilat LE into the bed and for repositioning in the bed.  Pt positioned with pillow under LLE and ice applied around L thigh.  Husband feels pt is having difficulty sequencing transfers and gait with RW.  Demonstrated and verbalized safe sequence with RW with emphasis on UE lifting in order to advance RLE with minimal weight through LLE.  Will practice again in second session.  Second session: Pt and husband educated on ELOS and D/C date.  Also educated pt and husband on importance of mobility and spending time OOB in order to allow gravity and movement to assist with GI mobility and offset side effects of pain medication and immobility.  Pt agreeable to transfer to recliner for lunch.  Performed LLE AROM and AAROM exercises bed level for increased muscle activation around vascular system, for edema management and increase ROM and strength.  Performed 10-12 reps ankle pumps, SAQ, heel slides, hip ABD/ADD, hip IR, hip flexion, and glute sets.  Following exercises pt transferred supine > sit with HOB elevated and bed rail mod A.  Performed stand pivot transfer bed > recliner with min-mod A and RW with pt continuing to require verbal cues for safe hand placement during sit >stand but pt demonstrating improved sequence during pivot and improved ability to maintain TDBW on LLE.  Pt set up in recliner for lunch with LE  elevated; educated pt and husband on importance of elevating her LLE for edema management.    PT Evaluation Precautions/Restrictions Precautions Precautions: Fall Restrictions Weight Bearing Restrictions: Yes LLE Weight Bearing: Touchdown weight bearing LLE Partial Weight Bearing Percentage or Pounds: 25% General Chart Reviewed: Yes Response to Previous Treatment: Patient reporting fatigue but able to participate. Family/Caregiver Present: Yes (husband: Josph Macho) Vital SignsTherapy Vitals Pulse Rate: 81 BP: 127/68 mmHg Patient Position, if appropriate: Standing Pain Pain Assessment Pain Assessment: 0-10 Pain Score: 3  Pain Type: Surgical pain Pain Location: Leg Pain Orientation: Left Pain Descriptors / Indicators: Discomfort;Sore Pain Frequency: Intermittent Pain Onset: On-going Patients Stated Pain Goal: 3 Pain Intervention(s): Cold applied Multiple Pain Sites: No Home Living/Prior Functioning Home Living Available Help at Discharge: Family;Available 24 hours/day Type of Home: House (Son lives in multi-level house; pt and husband live in [place]) Home Access: Stairs to enter (Pt home at [place] is level entry) Technical brewer of Steps: 3 Entrance Stairs-Rails: None Home Layout: Two level;Able to live on main level with bedroom/bathroom (at son's house; pt's ILF home is one level)  Lives With: Spouse;Son (Lives in [place] with husband; visiting family in [place]. D/C to son's house initially) Prior Function Level of Independence: Independent with homemaking with ambulation;Independent with gait;Independent with transfers  Able to Take Stairs?: Yes Driving: Yes Vocation: Retired Psychologist, prison and probation services Level: Oriented X4 Risk analyst Light Touch: Appears Intact Stereognosis: Not tested Hot/Cold: Not tested Proprioception: Appears Intact Coordination Gross Motor Movements are Fluid and Coordinated: No Coordination and Movement Description: Limited by pain and weakness  LLE Motor  Motor Motor: Other (comment) Motor - Skilled Clinical Observations: Generalized weakness and pain LLE  Mobility Bed Mobility Bed Mobility: Supine to Sit;Sit to Supine Supine to Sit: 3: Mod  assist;HOB flat Sit to Supine: 3: Mod assist;HOB flat Sit to Supine - Details (indicate cue type and reason): Required mod A sit > supine to bring each LE into the bed secondary to weakness and pain in LLE. Also requires assistance to reposition in bed Transfers Transfers: Yes Stand Pivot Transfers: 3: Mod assist;With armrests Stand Pivot Transfer Details (indicate cue type and reason): Stand pivot transfers recliner > w/c > bed with UE support on arm rests and RW with verbal cues for safe sequence, hand placement and placement of L foot to prevent increased WB.  Required lifting assistance to stand from seat and verbal cues for safe sequence while pivoting.  Pt also requires cues to maintain 25% WB during pivot.   Locomotion  Ambulation Ambulation/Gait Assistance: 3: Mod assist Ambulation Distance (Feet): 3 Feet Gait Gait: Yes Gait Pattern: Impaired Gait Pattern: Step-to pattern;Decreased step length - right;Decreased step length - left;Decreased stance time - left;Decreased stride length;Antalgic Stairs / Additional Locomotion Stairs: Yes Stairs Assistance: 2: Max Armed forces technical officer Details (indicate cue type and reason): Pt required max A for stair negotiation up/down one step forwards to ascend and backwards to descend with pt unable to WB enough through bilat UE and placing increased weight through LLE causing LLE to buckle in stance with max-total A  to recover.  Discussed with husband that since their son does not have rails they may need to learn to bump her up the entry stairs in w/c. Stair Management Technique: Two rails;Step to pattern;Forwards;Backwards Number of Stairs: 1 Height of Stairs: 4 Wheelchair Mobility Wheelchair Mobility: No Distance: 150 with total A  secondary to R shoulder bursitis  Trunk/Postural Assessment  Cervical Assessment Cervical Assessment: Within Functional Limits Thoracic Assessment Thoracic Assessment: Within Functional Limits Lumbar Assessment Lumbar Assessment: Within Functional Limits Postural Control Postural Control: Deficits on evaluation (impaired balance reactions seconary to surgery, WB restrictions and pain)  Balance Balance Balance Assessed: Yes Static Sitting Balance Static Sitting - Balance Support: Bilateral upper extremity supported;Feet supported Static Sitting - Level of Assistance: 4: Min assist Dynamic Sitting Balance Dynamic Sitting - Balance Support: Bilateral upper extremity supported;Feet supported Dynamic Sitting - Level of Assistance: 4: Min Insurance risk surveyor Standing - Balance Support: Right upper extremity supported;Left upper extremity supported Static Standing - Level of Assistance: 4: Min assist Dynamic Standing Balance Dynamic Standing - Balance Support: Right upper extremity supported;Left upper extremity supported Dynamic Standing - Level of Assistance: 3: Mod assist Extremity Assessment  RLE Assessment RLE Assessment: Within Functional Limits LLE Assessment LLE Assessment: Exceptions to Hosp Upr  LLE Strength LLE Overall Strength: Deficits;Due to pain LLE Overall Strength Comments: hip flexion 1/5, knee flexion/extension 3/5, ankle DF 4/5  FIM:  FIM - Bed/Chair Transfer Bed/Chair Transfer Assistive Devices: Arm rests;Walker Bed/Chair Transfer: 3: Bed > Chair or W/C: Mod A (lift or lower assist);3: Supine > Sit: Mod A (lifting assist/Pt. 50-74%/lift 2 legs FIM - Locomotion: Wheelchair Distance: 150 with total A secondary to R shoulder bursitis Locomotion: Wheelchair: 1: Total Assistance/staff pushes wheelchair (Pt<25%) FIM - Locomotion: Ambulation Locomotion: Ambulation Assistive Devices: Administrator Ambulation/Gait Assistance: 3: Mod assist Locomotion:  Ambulation: 1: Travels less than 50 ft with moderate assistance (Pt: 50 - 74%) FIM - Locomotion: Stairs Locomotion: Scientist, physiological: Hand rail - 2 Locomotion: Stairs: 1: Up and Down < 4 stairs with maximal assistance (Pt: 25 - 49%)   Refer to Care Plan for Long Term Goals  Recommendations for other services: None  Discharge Criteria:  Patient will be discharged from PT if patient refuses treatment 3 consecutive times without medical reason, if treatment goals not met, if there is a change in medical status, if patient makes no progress towards goals or if patient is discharged from hospital.  The above assessment, treatment plan, treatment alternatives and goals were discussed and mutually agreed upon: by patient and by family  Malachy Mood 12/27/2013, 11:32 AM

## 2013-12-27 NOTE — Progress Notes (Signed)
Occupational Therapy Session Note  Patient Details  Name: Vanessa Page MRN: [id number] Date of Birth: [birth year]  Today's Date: 12/27/2013 Time: 1300-1400 Time Calculation (min): 60 min  Short Term Goals: Week 1:  OT Short Term Goal 1 (Week 1): Patient will transfer to toilet with min assist OT Short Term Goal 2 (Week 1): Patient will don LB clothing with mod assist using adaptive equipment OT Short Term Goal 3 (Week 1): Patient will transfer to shower with mod assist OT Short Term Goal 4 (Week 1): Patient will complete toileting with mod assist  Skilled Therapeutic Interventions/Progress Updates: ADL-retraining with focus on patient education and training on use of AE to include shoe posts, reacher, sock aid, LH sponge.  Patient was able to sufficiently reach to her right foot to remove sock but required use of sock aid, reacher and shoe posts to improve reach and reduce symptoms of pain during left hip flexion.  Patient completed sit><stand from recliner with only verbal cues for technique and ambulated to sink and back (approx 10) with good attention to PWB status, requiring only steadying assist during turn and verbal cues to flex hip/knee and maintain foot alignment while ambulatingt.  Therapy Documentation Precautions:  Precautions Precautions: Fall Precaution Comments: TDWB, L 25% weight bearing Restrictions Weight Bearing Restrictions: Yes LLE Weight Bearing: Touchdown weight bearing LLE Partial Weight Bearing Percentage or Pounds: 25%  General: General Chart Reviewed: Yes Additional Pertinent History: History of bursitis, and history of DVT - on coumadin  Vital Signs: Therapy Vitals Pulse Rate: 81 BP: 146/76 mmHg Patient Position, if appropriate: Sitting  Pain: Pain Assessment Pain Assessment: 0-10 Pain Score: 9  Pain Type: Surgical pain Pain Location: Hip Pain Orientation: Left Pain Descriptors / Indicators: Sharp Pain Frequency: Intermittent Pain Onset:  With Activity Pain Intervention(s): Medication (See eMAR);Repositioned;Emotional support Multiple Pain Sites: No  See FIM for current functional status  Therapy/Group: Individual Therapy  Salome Spotted 12/27/2013, 2:14 PM

## 2013-12-27 NOTE — Evaluation (Signed)
Occupational Therapy Assessment and Plan  Patient Details  Name: Vanessa Page MRN: [id number] Date of Birth: July 24, 1931  OT Diagnosis: abnormal posture, acute pain, muscle weakness (generalized), pain in joint and swelling of limb Rehab Potential: Rehab Potential: Excellent ELOS: 10 days   Today's Date: 12/27/2013 Time: 0801-0906 Time Calculation (min): 65 min  Problem List:  Patient Active Problem List   Diagnosis Date Noted  . Hip fracture requiring operative repair 12/26/2013  . Leukocytosis, unspecified 12/25/2013  . Intertrochanteric fracture of left hip 12/23/2013  . Hip fracture 12/23/2013  . Chronic anticoagulation 12/23/2013  . History of DVT of lower extremity 12/23/2013  . Bronchiectasis 12/23/2013  . Essential hypertension 12/23/2013    Past Medical History:  Past Medical History  Diagnosis Date  . Hypertension   . 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 DVT (deep venous thrombosis)   . Bursitis of right shoulder   . Bronchiectasis    Past Surgical History:  Past Surgical History  Procedure Laterality Date  . Intramedullary (im) nail intertrochanteric Left 12/24/2013    Procedure: INTRAMEDULLARY (IM) NAIL INTERTROCHANTRIC;  Surgeon: Mcarthur Rossetti, MD;  Location: [facility];  Service: Orthopedics;  Laterality: Left;    Assessment & Plan Clinical Impression: Patient is a 80 y.o. year old female with history of HTN, DVT-chronic coumadin, Bronchiectasis; who tripped and fell while playing with her grand kids on 12/23/13. She developed immediate onset of left hip pain due to Left comminuted IT hip fracture. INR at admission 1.82 and coumadin held. On 12/24/13, patient underwent IM nailing of left hip by Dr. Ninfa Linden. She has had hypotension as well as poor po intake.  Post op TDWB up to 25% for 4 weeks and coumadin resumed.   Patient transferred to [facility] on 12/26/2013 .    Patient currently requires total with basic  self-care skills secondary to muscle weakness and muscle joint tightness, edema, pain and nausea.  Prior to hospitalization, patient could complete basic ADL / IADL  with independent .  Patient will benefit from skilled intervention to decrease level of assist with basic self-care skills, increase independence with basic self-care skills and increase level of independence with iADL prior to discharge home with care partner.  Anticipate patient will require minimal physical assistance and follow up home health.  OT - End of Session Activity Tolerance: Decreased this session Endurance Deficit: Yes Endurance Deficit Description: secondary to pain OT Assessment Rehab Potential: Excellent OT Patient demonstrates impairments in the following area(s): Balance;Pain;Safety;Edema;Endurance;Motor OT Basic ADL's Functional Problem(s): Grooming;Bathing;Dressing;Toileting OT Transfers Functional Problem(s): Toilet;Tub/Shower OT Plan OT Intensity: Minimum of 1-2 x/day, 45 to 90 minutes OT Frequency: 5 out of 7 days OT Duration/Estimated Length of Stay: 10 days OT Treatment/Interventions: Balance/vestibular training;Discharge planning;DME/adaptive equipment instruction;Functional mobility training;Pain management;Skin care/wound managment;Therapeutic Activities;UE/LE Strength taining/ROM;Therapeutic Exercise;Self Care/advanced ADL retraining;Patient/family education;Community reintegration OT Self Feeding Anticipated Outcome(s): Independent OT Basic Self-Care Anticipated Outcome(s): Min assist OT Toileting Anticipated Outcome(s): Modified Independent OT Bathroom Transfers Anticipated Outcome(s): Mod I OT Recommendation Patient destination: Home Follow Up Recommendations: Home health OT Equipment Recommended: 3 in 1 bedside comode   Skilled Therapeutic Intervention Patient seen this am for OT assessment of basic self care skills.  Patient's husband Josph Macho present for much of this session.  Patient very  limited by pain, and nausea but motivated to work through discomfort.  Patient with difficulty bringing left foot to floor, and relaxing hip flexion in standing.  Pain 6/10 at rest after pain  medication, and 9/10 with activity.  Patient nausea increased with mobility tasks.  Adaptive equipment was introduced on acute, and will be reinforced to increase independence with basic ADL.    OT Evaluation Precautions/Restrictions  Precautions Precautions: Fall Precaution Comments: TDWB, L 25% weight bearing Restrictions Weight Bearing Restrictions: Yes LLE Weight Bearing: Touchdown weight bearing LLE Partial Weight Bearing Percentage or Pounds: 25% General Chart Reviewed: Yes Additional Pertinent History: History of bursitis, and history of DVT - on coumadin Vital Signs Therapy Vitals Pulse Rate: 81 BP: 146/76 mmHg Patient Position, if appropriate: Sitting Pain Pain Assessment Pain Assessment: 0-10 Pain Score: 9  Pain Type: Surgical pain Pain Location: Hip Pain Orientation: Left Pain Descriptors / Indicators: Sharp Pain Frequency: Intermittent Pain Onset: With Activity Patients Stated Pain Goal: 3 Pain Intervention(s): Medication (See eMAR);Repositioned;Emotional support Multiple Pain Sites: No Home Living/Prior [place] expects to be discharged to:: Private residence Living Arrangements: Spouse/significant other Available Help at Discharge: Available 24 hours/day;Family Type of Home: House (Initially returning to son's home untilk able to return to [place]) Home Access: Stairs to enter [employer] of Steps: 3 Entrance Stairs-Rails: None Home Layout: Two level;Able to live on main level with bedroom/bathroom Additional Comments: visiting from [place] and fell.  Lives With: Spouse IADL History Homemaking Responsibilities: Yes Occupation: Retired Type of Occupation: Dietician  Prior Function Level of Independence: Independent with  basic ADLs;Independent with homemaking with ambulation;Independent with transfers  Able to Take Stairs?: Yes Driving: Yes Vocation: Retired Leisure: Hobbies-yes (Comment) Comments: Exercises 3x/week in the gym, active with grandchildren   Vision/Perception  Vision- History Baseline Vision/History: Wears glasses Wears Glasses: At all times Patient Visual Report: No change from baseline Vision- Assessment Vision Assessment?: No apparent visual deficits  Cognition Overall Cognitive Status: Within Functional Limits for tasks assessed Arousal/Alertness: Awake/alert Orientation Level: Oriented X4 Attention: Alternating Alternating Attention: Appears intact Memory: Appears intact Awareness: Appears intact Problem Solving: Appears intact Safety/Judgment: Appears intact Sensation Sensation Light Touch: Appears Intact Stereognosis: Appears Intact Hot/Cold: Appears Intact Proprioception: Appears Intact Coordination Gross Motor Movements are Fluid and Coordinated: Yes (UE's) Fine Motor Movements are Fluid and Coordinated: Yes (UE's) Coordination and Movement Description: Limited by pain and weakness LLE Motor  Motor Motor: Within Functional Limits Motor - Skilled Clinical Observations: Generalized weakness and pain LLE Mobility  Bed Mobility Bed Mobility: Supine to Sit Supine to Sit: 3: Mod assist;HOB flat Supine to Sit Details: Manual facilitation for weight bearing;Tactile cues for weight shifting Sit to Supine: 3: Mod assist;HOB flat Sit to Supine - Details (indicate cue type and reason): Required mod A sit > supine to bring each LE into the bed secondary to weakness and pain in LLE. Also requires assistance to reposition in bed Transfers Transfers: Sit to Stand Sit to Stand: 3: Mod assist Sit to Stand Details: Manual facilitation for weight bearing;Manual facilitation for weight shifting;Verbal cues for safe use of DME/AE;Verbal cues for precautions/safety;Tactile cues for  weight shifting  Trunk/Postural Assessment  Cervical Assessment Cervical Assessment: Within Functional Limits Thoracic Assessment Thoracic Assessment: Within Functional Limits Lumbar Assessment Lumbar Assessment: Within Functional Limits Postural Control Postural Control: Deficits on evaluation (limited weight shift to left hip in sitting)  Balance Balance Balance Assessed: Yes Static Sitting Balance Static Sitting - Balance Support: No upper extremity supported;Feet unsupported Static Sitting - Level of Assistance: 4: Min assist Dynamic Sitting Balance Dynamic Sitting - Balance Support: Bilateral upper extremity supported;Feet supported Dynamic Sitting - Level of Assistance: 4: Min assist Dynamic Sitting - Balance  Activities: Reaching for objects;Forward lean/weight shifting Sitting balance - Comments: Very limited weight shift forward or to left in sitting Static Standing Balance Static Standing - Balance Support: Bilateral upper extremity supported;During functional activity Static Standing - Level of Assistance: 3: Mod assist Static Standing - Comment/# of Minutes: 15 seconds during bathing / dressing tasks Dynamic Standing Balance Dynamic Standing - Balance Support: During functional activity;Bilateral upper extremity supported Dynamic Standing - Level of Assistance: 3: Mod assist Dynamic Standing - Balance Activities: Forward lean/weight shifting Extremity/Trunk Assessment RUE Assessment RUE Assessment: Exceptions to [facility] (Recent history of bursitis) LUE Assessment LUE Assessment: Within Functional Limits  FIM:  FIM - Eating Eating Activity: 7: Complete independence:no helper FIM - Grooming Grooming Steps: Wash, rinse, dry face;Wash, rinse, dry hands;Oral care, brush teeth, clean dentures;Brush, comb hair Grooming: 5: Set-up assist to obtain items FIM - Bathing Bathing Steps Patient Completed: Chest;Right Arm;Left Arm;Abdomen;Right upper leg;Left upper leg Bathing: 3:  Mod-Patient completes 5-7 6f10 parts or 50-74% FIM - Upper Body Dressing/Undressing Upper body dressing/undressing steps patient completed: Thread/unthread right bra strap;Thread/unthread left bra strap;Hook/unhook bra;Thread/unthread right sleeve of pullover shirt/dresss;Thread/unthread left sleeve of pullover shirt/dress;Put head through opening of pull over shirt/dress;Pull shirt over trunk Upper body dressing/undressing: 5: Set-up assist to: Obtain clothing/put away FIM - Lower Body Dressing/Undressing Lower body dressing/undressing: 1: Total-Patient completed less than 25% of tasks FIM - Toileting Toileting steps completed by patient: Performs perineal hygiene Toileting: 2: Max-Patient completed 1 of 3 steps FIM - Bed/Chair Transfer Bed/Chair Transfer Assistive Devices: Arm rests;Walker Bed/Chair Transfer: 3: Bed > Chair or W/C: Mod A (lift or lower assist);3: Supine > Sit: Mod A (lifting assist/Pt. 50-74%/lift 2 legs FIM - TRadio producerDevices: Bedside commode Toilet Transfers: 3-To toilet/BSC: Mod A (lift or lower assist);3-From toilet/BSC: Mod A (lift or lower assist) FIM - Tub/Shower Transfers Tub/shower Transfers: 0-Activity did not occur or was simulated (due to pain and nausea)   Refer to Care Plan for Long Term Goals  Recommendations for other services: None  Discharge Criteria: Patient will be discharged from OT if patient refuses treatment 3 consecutive times without medical reason, if treatment goals not met, if there is a change in medical status, if patient makes no progress towards goals or if patient is discharged from hospital.  The above assessment, treatment plan, treatment alternatives and goals were discussed and mutually agreed upon: by patient and by family  KMariah Milling4/22/2015, 12:05 PM

## 2013-12-27 NOTE — Progress Notes (Signed)
ANTICOAGULATION CONSULT NOTE - Follow up [facility] for Warfarin Indication: Hx DVT/Protein C Deficiency  Allergies  Allergen Reactions  . Codeine     Patient Measurements: Height: 4\' 11"  (149.9 cm) Weight: 122 lb 9.2 oz (55.6 kg) IBW/kg (Calculated) : 43.2  Vital Signs: Temp: 98.2 F (36.8 C) (04/22 0532) Temp src: Oral (04/22 0532) BP: 146/76 mmHg (04/22 1135) Pulse Rate: 81 (04/22 1100)  Labs:  Recent Labs  12/25/13 0448 12/26/13 0407 12/27/13 0500  HGB 11.0* 10.2* 11.3*  HCT 32.7* 30.7* 34.3*  PLT 167 161 188  LABPROT 23.1* 22.7* 19.2*  INR 2.12* 2.08* 1.67*  CREATININE 0.62 0.59 0.59    Estimated Creatinine Clearance: 42.7 ml/min (by C-G formula based on Cr of 0.59).   Medical History: Past Medical History  Diagnosis Date  . Hypertension   . 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 DVT (deep venous thrombosis)   . Bursitis of right shoulder   . Bronchiectasis     Assessment: 80 y.o. F on chronic warfarin PTA for hx DVT/protein C deficiency. Warfarin was held on admission to [facility] and the patient underwent L-ORIF on 12/24/13. Warfarin was resumed 4/19 evening. The patient was transferred to [facility] inpatient rehab and pharmacy has been re-consulted to dose warfarin.   New GOAL INR = 1.5-2 (per our consult here at [facility]) per MD request due to recurrent ocular bleeding.  INR is 1.67 today = within goal per MD. INR decreased from previously 2.08 <2.12. She has received coumadin 1mg  daily for past 3 days.  MD notes that patient has had hypotension as well as poor po intake. Ate 50% of breakfast today. ABLA:  H/H 11.3/34.3 improved, PLTC 188 improved. No bleeding noted.   Note: Vitamin K was given on 12/23/13 prior to L-ORIF surgery.  Home dose: 2mg  daily except 1mg  Mon/Fri   Goal of Therapy:  INR 1.5-2 (per MD - due to recurrent ocular bleeding)   Plan:  1. Warfarin 2 mg x 1 dose at 1800 today (may need mostly 1mg   daily with one or two 2 mg doses during the week to keep INR 1.5-2.0).  2. Daily PT/INR 3. Will continue to monitor for any signs/symptoms of bleeding.  Nicole Cella, RPh Clinical Pharmacist Pager: [phone] 12/27/2013 12:04 PM

## 2013-12-27 NOTE — Progress Notes (Signed)
Patient information reviewed and entered into eRehab system by Dawan Farney, RN, CRRN, PPS Coordinator.  Information including medical coding and functional independence measure will be reviewed and updated through discharge.     Per nursing patient was given "Data Collection Information Summary for Patients in Inpatient Rehabilitation Facilities with attached "Privacy Act Statement-Health Care Records" upon admission.  

## 2013-12-28 ENCOUNTER — Inpatient Hospital Stay (HOSPITAL_COMMUNITY): Payer: Medicare Other | Admitting: *Deleted

## 2013-12-28 ENCOUNTER — Encounter (HOSPITAL_COMMUNITY): Payer: Medicare Other | Admitting: Occupational Therapy

## 2013-12-28 ENCOUNTER — Inpatient Hospital Stay (HOSPITAL_COMMUNITY): Payer: Medicare Other | Admitting: Occupational Therapy

## 2013-12-28 LAB — URINE CULTURE
COLONY COUNT: NO GROWTH
CULTURE: NO GROWTH

## 2013-12-28 LAB — PROTIME-INR
INR: 1.7 — ABNORMAL HIGH (ref 0.00–1.49)
Prothrombin Time: 19.5 seconds — ABNORMAL HIGH (ref 11.6–15.2)

## 2013-12-28 MED ORDER — WARFARIN SODIUM 1 MG PO TABS
1.0000 mg | ORAL_TABLET | Freq: Once | ORAL | Status: AC
  Administered 2013-12-28: 1 mg via ORAL
  Filled 2013-12-28: qty 1

## 2013-12-28 NOTE — Progress Notes (Signed)
Social Work Patient ID: Vanessa Page, female   DOB: [birth year], 80 y.o.   MRN: [id number]  Vanessa Mesi Maxxwell Edgett, LCSW Social Worker Signed  Patient Care Conference Service date: 12/28/2013 1:38 PM  Inpatient RehabilitationTeam Conference and Plan of Care Update Date: 12/27/2013   Time: 11:15 AM   Patient Name: Vanessa Page       Medical Record Number: [id number]   Date of Birth: 1930-09-13 Sex: Female         Room/Bed: 4M06C/4M06C-01 Payor Info: Payor: MEDICARE / Plan: MEDICARE PART A AND B / Product Type: *No Product type* /   Admitting Diagnosis: L HIP FX   Admit Date/Time:  12/26/2013  3:03 PM Admission Comments: No comment available   Primary Diagnosis:  <principal problem not specified> Principal Problem: <principal problem not specified>    Patient Active Problem List     Diagnosis  Date Noted   .  Hip fracture requiring operative repair  12/26/2013   .  Leukocytosis, unspecified  12/25/2013   .  Intertrochanteric fracture of left hip  12/23/2013   .  Hip fracture  12/23/2013   .  Chronic anticoagulation  12/23/2013   .  History of DVT of lower extremity  12/23/2013   .  Bronchiectasis  12/23/2013   .  Essential hypertension  12/23/2013     Expected Discharge Date: Expected Discharge Date: 01/05/14  Team Members Present: Physician leading conference: Dr. Alysia Penna Social Worker Present: Alfonse Alpers, LCSW Nurse Present: Heather Roberts, RN PT Present: Raylene Everts, PT;Emily Rinaldo Cloud, PT OT Present: Antony Salmon, Maryella Shivers, OT SLP Present: Gunnar Fusi, SLP PPS Coordinator present : Daiva Nakayama, RN, CRRN;Becky Alwyn Ren, PT        Current Status/Progress  Goal  Weekly Team Focus   Medical     left IT hip fracture , pain is an issues, nausea  improve pain  adjust meds, bowel program   Bowel/Bladder     cont bowel and bladder  lbm 4/18  min assist  continue to monitor bowel pattern   Swallow/Nutrition/ Hydration            ADL's     total assist  for ADL  Min assist / supervision  pain control, nausea control, increase functional mobility, introduce adaptive equipment   Mobility     Mod A overall  Supervision-min A   Activity tolerance, pain, ROM, maintaining WB restrictions during transfers and gait   Communication            Safety/Cognition/ Behavioral Observations    no unsafe behavior  min assist  continue to monitor every shift   Pain     hip pain left   ultram 50mg  prn  <3  continue to monitor every shift   Skin     bruising at site  no new breakdown this admission  monitor every shift    Rehab Goals Patient on target to meet rehab goals: Yes Rehab Goals Revised: this is pt's first conference *See Care Plan and progress notes for long and short-term goals.    Barriers to Discharge:  pain control , poor activity tolerance      Possible Resolutions to Barriers:    see above cont rehab      Discharge Planning/Teaching Needs:    Pt will be going home to her son's home with her husband initially and then back to her home in [place] with her husband in their independent home within a continuim of care retirement community.  Husband is present at bedside all the time.    Team Discussion:    Pt is struggling with pain and nausea and yet she continues to work through it with therapies.  Dr. Letta Pate is adjusting her medications.  Family will need to bump pt up three steps in the w/c, as there are no hand railings.  Pt's husband is supportive and they have good support in their community.   Revisions to Treatment Plan:    None    Continued Need for Acute Rehabilitation Level of Care: The patient requires daily medical management by a physician with specialized training in physical medicine and rehabilitation for the following conditions: Daily direction of a multidisciplinary physical rehabilitation program to ensure safe treatment while eliciting the highest outcome that is of practical value to the patient.: Yes Daily medical  management of patient stability for increased activity during participation in an intensive rehabilitation regime.: Yes Daily analysis of laboratory values and/or radiology reports with any subsequent need for medication adjustment of medical intervention for : Other;Post surgical problems  Vanessa Mesi Vanessa Page 12/28/2013, 1:39 PM

## 2013-12-28 NOTE — Progress Notes (Signed)
Physical Therapy Session Note  Patient Details  Name: Vanessa Page MRN: [id number] Date of Birth: April 19, 1931  Today's Date: 12/28/2013 Time: 8:02-9:00 (77min) and 14:50-15:30 (43min)   Short Term Goals: Week 1:  PT Short Term Goal 1 (Week 1): = LTG secondary to short LOS  Skilled Therapeutic Interventions/Progress Updates:  First tx focused on therex for strengthening and functional mobility training within WB restrictions.  Pt resting in bed. Reinforced education on importance of increasing mobility within pain limits.  Supine therex with verbal cues, 2x10 for ankel pumps, quad and glute sets, SAQ, hip ABD/ADD, and heel slides.   Supine>sit with min A for LLE advancing  Stand-step transfer with RW with min A x1 and Mod A for steadying and lowering assist, increased time required.   Seated therex including: LAQ, manually resisted HS curls, hip ADD squeeze, and marching in limited range  WC<>toilet transfer with Mod A and cues for grab bar use. Pt needing cues and steadying assist for PWB stepping to toilet.   Gait in RW with mod A for steadying and max cues for sequence x12' in room. Gait marked by too short a L step and too great on R. Sequence and safety imprioved with practice.   Second tx focused on [facility] management, gait and pre-gait tasks, and therex for LE strengthening. Hand-off from OT.  Provided alternative dimension WC to assess optimal fit for navigating in-home at DC, but 16x16 slightly too narrow for optimal comfort at L hip. However, pt able to easily propel with no discomfort 2x30' with min A for steering.  Pt engaged in seated therex with 2# on RLE and none on L: LAQ and marching.  Also performed standing therex in // bars for L hip marching for pre-gait training, hip ABD, and HS curl in available range.  Pt reporting 2/10 pain when not moving, modified tx prn.  Discussed home access with pt/spouse and explained balancing WC and RW use in home to maintain available level  of mobility yet not increasing strain on shoulders.  Gait training in hall with min A x30' with demonstration and cues for sequencing and step length.  Spouse took WC measurements for 18x18 to attempt finding a Materials engineer at church. Discussed that static leg rest will likely be fine by DC time.          Therapy Documentation Precautions:  Precautions Precautions: Fall Precaution Comments: TDWB, L 25% weight bearing Restrictions Weight Bearing Restrictions: Yes LLE Weight Bearing: Partial weight bearing LLE Partial Weight Bearing Percentage or Pounds: 25% General:   Vital Signs: Therapy Vitals Temp: 98.6 F (37 C) Temp src: Oral Pulse Rate: 82 Resp: 17 BP: 100/58 mmHg Patient Position, if appropriate: Standing Oxygen Therapy SpO2: 91 % O2 Device: None (Room air) Pain: 8/10, pt premedicated and ice provided after    See FIM for current functional status  Therapy/Group: Individual Therapy Kennieth Rad, PT, DPT  12/28/2013, 8:15 AM

## 2013-12-28 NOTE — Progress Notes (Signed)
Social Work Patient ID: Vanessa Page, female   DOB: May 19, 1931, 80 y.o.   MRN: [id number]  CSW met with pt and her husband to update them on team conference discussion.  Told pt that MD was adjusting pain medications and she was pleased about this.  Also, informed them that pt will need to stay in [place] until she follows up with the orthopedic surgeon.  Husband was not surprised about this and is okay with that plan.  Explained that pt's goals are for supervision with some minimal assist goals and pt's husband feels comfortable helping her with those things.  CSW will continue to follow pt and assist as needed.

## 2013-12-28 NOTE — Progress Notes (Signed)
ANTICOAGULATION CONSULT NOTE - Follow Up Consult  Pharmacy Consult for Coumadin Indication: Hx DVT/Protein C Deficiency   Allergies  Allergen Reactions  . Codeine     Patient Measurements: Height: 4\' 11"  (149.9 cm) Weight: 122 lb 9.2 oz (55.6 kg) IBW/kg (Calculated) : 43.2 kg   Vital Signs: Temp: 98.6 F (37 C) (04/23 0548) Temp src: Oral (04/23 0548) BP: 100/58 mmHg (04/23 0551) Pulse Rate: 82 (04/23 0551)  Labs:  Recent Labs  12/26/13 0407 12/27/13 0500 12/28/13 0552  HGB 10.2* 11.3*  --   HCT 30.7* 34.3*  --   PLT 161 188  --   LABPROT 22.7* 19.2* 19.5*  INR 2.08* 1.67* 1.70*  CREATININE 0.59 0.59  --     Estimated Creatinine Clearance: 42.7 ml/min (by C-G formula based on Cr of 0.59).   Medications:  Prescriptions prior to admission  Medication Sig Dispense Refill  . albuterol (PROVENTIL HFA;VENTOLIN HFA) 108 (90 BASE) MCG/ACT inhaler Inhale 1 puff into the lungs every 6 (six) hours as needed for wheezing or shortness of breath.      Marland Kitchen alendronate (FOSAMAX) 70 MG tablet Take 70 mg by mouth once a week. Take with a full glass of water on an empty stomach.      Marland Kitchen azelastine (ASTELIN) 137 MCG/SPRAY nasal spray Place 2 sprays into both nostrils 2 (two) times daily as needed for allergies. Use in each nostril as directed      . CALCIUM CARBONATE PO Take 1 tablet by mouth daily.      Marland Kitchen conjugated estrogens (PREMARIN) vaginal cream Place 1 Applicatorful vaginally 3 (three) times a week.      . fluticasone (FLONASE) 50 MCG/ACT nasal spray Place into both nostrils daily.      Marland Kitchen oxyCODONE-acetaminophen (ROXICET) 5-325 MG per tablet Take 1-2 tablets by mouth every 4 (four) hours as needed for severe pain.  60 tablet  0  . warfarin (COUMADIN) 1 MG tablet Take 1 tablet (1 mg total) by mouth daily.  60 tablet  0  . warfarin (COUMADIN) 2 MG tablet Take 1-2 mg by mouth daily. Takes 1 mg(1/2 tablet) on Monday and Friday and 2 mg (1 tablet) the rest of the week.        Scheduled:  . calcium carbonate  1 tablet Oral BID WC  . senna-docusate  2 tablet Oral BID  . Warfarin - Pharmacist Dosing Inpatient   Does not apply q1800    Assessment: 80 y.o. F on chronic warfarin PTA for hx DVT/protein C deficiency. Warfarin was held on admission to [facility] and the patient underwent L-ORIF on 12/24/13. Warfarin was resumed 4/19 evening. The patient was transferred to [facility] inpatient rehab and pharmacy has been re-consulted to dose warfarin. New GOAL INR = 1.5-2 (per our consult here at [facility]) per MD request due to recurrent ocular bleeding. INR is 1.7 today = within goal per MD. MD notes that patient has had hypotension as well as poor po intake. PO intake improved as patient ate 95% of breakfast and 50% of lunch today. ABLA: on 12/27/13 H/H 11.3/34.3 improved, PLTC 188 improved. No bleeding noted.    Goal of Therapy:  INR 1.5-2 (per MD - due to recurrent ocular bleeding) Monitor platelets by anticoagulation protocol: Yes   Plan:  1. Coumadin 1 mg today x1. 2. Daily PT/INR  3. Will continue to monitor for any signs/symptoms of bleeding.   Nicole Cella, RPh Clinical Pharmacist Pager: [phone] 12/28/2013,1:13 PM

## 2013-12-28 NOTE — Progress Notes (Signed)
Occupational Therapy Session Note  Patient Details  Name: Vanessa Page MRN: [id number] Date of Birth: 09-Dec-1930  Today's Date: 12/28/2013 Time: 0903-1000 Time Calculation (min): 57 min  Short Term Goals: Week 1:  OT Short Term Goal 1 (Week 1): Patient will transfer to toilet with min assist OT Short Term Goal 2 (Week 1): Patient will don LB clothing with mod assist using adaptive equipment OT Short Term Goal 3 (Week 1): Patient will transfer to shower with mod assist OT Short Term Goal 4 (Week 1): Patient will complete toileting with mod assist  Skilled Therapeutic Interventions/Progress Updates:    Patient seen this am for OT intervention to address left hip range of motion, activity tolerance, transitional movements as needed for basic bathing and dressing tasks.  Patient with improved ability to weight shift trunk forward to reach toward right foot as needed for bathing lower leg and foot, and donning lower body clothing.  Patient continues to report significant pain, and poor sleep last night.  Patient demonstrated significant improvement of nausea today therefore much better able to participate.  Husband Josph Macho present for part of OT session.  Reviewing bathroom set up in son's home, specifically shower stall.  Today, patient unable to effectively clear height for shower lip.  Patient given exercises to increase tricep and latissimus strength to enhance ability to clear shower at son's home.    Therapy Documentation Precautions:  Precautions Precautions: Fall Precaution Comments: TDWB, L 25% weight bearing Restrictions Weight Bearing Restrictions: Yes LLE Weight Bearing: Touchdown weight bearing LLE Partial Weight Bearing Percentage or Pounds: 25% Pain: 6/10 at rest; 9/10 with activity left hip  See FIM for current functional status  Therapy/Group: Individual Therapy  Mariah Milling 12/28/2013, 12:15 PM

## 2013-12-28 NOTE — Patient Care Conference (Signed)
Inpatient RehabilitationTeam Conference and Plan of Care Update Date: 12/27/2013   Time: 11:15 AM   Patient Name: Vanessa Page      Medical Record Number: [id number]  Date of Birth: 19-Aug-1931 Sex: Female         Room/Bed: 4M06C/4M06C-01 Payor Info: Payor: MEDICARE / Plan: MEDICARE PART A AND B / Product Type: *No Product type* /    Admitting Diagnosis: L HIP FX  Admit Date/Time:  12/26/2013  3:03 PM Admission Comments: No comment available   Primary Diagnosis:  <principal problem not specified> Principal Problem: <principal problem not specified>  Patient Active Problem List   Diagnosis Date Noted  . Hip fracture requiring operative repair 12/26/2013  . Leukocytosis, unspecified 12/25/2013  . Intertrochanteric fracture of left hip 12/23/2013  . Hip fracture 12/23/2013  . Chronic anticoagulation 12/23/2013  . History of DVT of lower extremity 12/23/2013  . Bronchiectasis 12/23/2013  . Essential hypertension 12/23/2013    Expected Discharge Date: Expected Discharge Date: 01/05/14  Team Members Present: Physician leading conference: Dr. Alysia Penna Social Worker Present: Alfonse Alpers, LCSW Nurse Present: Heather Roberts, RN PT Present: Raylene Everts, PT;Emily Rinaldo Cloud, PT OT Present: Antony Salmon, Maryella Shivers, OT SLP Present: Gunnar Fusi, SLP PPS Coordinator present : Daiva Nakayama, RN, CRRN;Becky Alwyn Ren, PT     Current Status/Progress Goal Weekly Team Focus  Medical   left IT hip fracture , pain is an issues, nausea  improve pain  adjust meds, bowel program   Bowel/Bladder   cont bowel and bladder  lbm 4/18  min assist  continue to monitor bowel pattern   Swallow/Nutrition/ Hydration             ADL's   total assist for ADL  Min assist / supervision  pain control, nausea control, increase functional mobility, introduce adaptive equipment   Mobility   Mod A overall  Supervision-min A   Activity tolerance, pain, ROM, maintaining WB restrictions during transfers  and gait   Communication             Safety/Cognition/ Behavioral Observations  no unsafe behavior  min assist  continue to monitor every shift   Pain   hip pain left   ultram 50mg  prn  <3  continue to monitor every shift   Skin   bruising at site  no new breakdown this admission  monitor every shift    Rehab Goals Patient on target to meet rehab goals: Yes Rehab Goals Revised: this is pt's first conference *See Care Plan and progress notes for long and short-term goals.  Barriers to Discharge: pain control , poor activity tolerance    Possible Resolutions to Barriers:  see above cont rehab    Discharge Planning/Teaching Needs:  Pt will be going home to her son's home with her husband initially and then back to her home in [place] with her husband in their independent home within a continu[facility] of care retirement community.  Husband is present at bedside all the time.   Team Discussion:  Pt is struggling with pain and nausea and yet she continues to work through it with therapies.  Dr. Letta Pate is adjusting her medications.  Family will need to bump pt up three steps in the w/c, as there are no hand railings.  Pt's husband is supportive and they have good support in their community.  Revisions to Treatment Plan:  None   Continued Need for Acute Rehabilitation Level of Care: The patient requires daily medical management by a physician with  specialized training in physical medicine and rehabilitation for the following conditions: Daily direction of a multidisciplinary physical rehabilitation program to ensure safe treatment while eliciting the highest outcome that is of practical value to the patient.: Yes Daily medical management of patient stability for increased activity during participation in an intensive rehabilitation regime.: Yes Daily analysis of laboratory values and/or radiology reports with any subsequent need for medication adjustment of medical intervention for : Other;Post  surgical problems  Silvestre Mesi Ilan Kahrs 12/28/2013, 1:39 PM

## 2013-12-28 NOTE — Progress Notes (Signed)
80 y.o. female with history of HTN, DVT-chronic coumadin, Bronchiectasis; who tripped and fell while playing with her grand kids on 12/23/13. She developed immediate onset of left hip pain due to Left comminuted IT hip fracture. INR at admission 1.82 and coumadin held. On 12/24/13, patient underwent IM nailing of left hip by Dr. Ninfa Linden. CBC today with leucocytosis with WBC-20.6. Has had hypotension as well as poor po intake. Lisinopril held today. Post op TDWB up to 25% for 4 weeks and coumadin resumed   Subjective/Complaints: Some pain last noc releived by meds Pain meds make her feel "woozy" Review of Systems - Negative except constipation, no BM since admit Objective: Vital Signs: Blood pressure 100/58, pulse 82, temperature 98.6 F (37 C), temperature source Oral, resp. rate 17, height 4' 11"  (1.499 m), weight 55.6 kg (122 lb 9.2 oz), SpO2 91.00%. No results found. Results for orders placed during the hospital encounter of 12/26/13 (from the past 72 hour(s))  URINALYSIS, ROUTINE W REFLEX MICROSCOPIC     Status: Abnormal   Collection Time    12/26/13 10:32 PM      Result Value Ref Range   Color, Urine YELLOW  YELLOW   APPearance CLEAR  CLEAR   Specific Gravity, Urine 1.027  1.005 - 1.030   pH 6.5  5.0 - 8.0   Glucose, UA NEGATIVE  NEGATIVE mg/dL   Hgb urine dipstick NEGATIVE  NEGATIVE   Bilirubin Urine NEGATIVE  NEGATIVE   Ketones, ur NEGATIVE  NEGATIVE mg/dL   Protein, ur NEGATIVE  NEGATIVE mg/dL   Urobilinogen, UA 1.0  0.0 - 1.0 mg/dL   Nitrite NEGATIVE  NEGATIVE   Leukocytes, UA SMALL (*) NEGATIVE  URINE CULTURE     Status: None   Collection Time    12/26/13 10:32 PM      Result Value Ref Range   Specimen Description URINE, CLEAN CATCH     Special Requests NONE     Culture  Setup Time       Value: 12/27/2013 05:08     Performed at [facility]       Value: NO GROWTH     Performed at [facility]   Culture       Value: NO GROWTH      Performed at [facility]   Report Status 12/28/2013 FINAL    URINE MICROSCOPIC-ADD ON     Status: Abnormal   Collection Time    12/26/13 10:32 PM      Result Value Ref Range   Squamous Epithelial / LPF MANY (*) RARE   WBC, UA 3-6  <3 WBC/hpf   Bacteria, UA RARE  RARE  COMPREHENSIVE METABOLIC PANEL     Status: Abnormal   Collection Time    12/27/13  5:00 AM      Result Value Ref Range   Sodium 138  137 - 147 mEq/L   Potassium 3.9  3.7 - 5.3 mEq/L   Chloride 99  96 - 112 mEq/L   CO2 27  19 - 32 mEq/L   Glucose, Bld 107 (*) 70 - 99 mg/dL   BUN 15  6 - 23 mg/dL   Creatinine, Ser 0.59  0.50 - 1.10 mg/dL   Calcium 8.5  8.4 - 10.5 mg/dL   Total Protein 5.9 (*) 6.0 - 8.3 g/dL   Albumin 2.6 (*) 3.5 - 5.2 g/dL   AST 25  0 - 37 U/L   ALT 10  0 -  35 U/L   Alkaline Phosphatase 73  39 - 117 U/L   Total Bilirubin 0.4  0.3 - 1.2 mg/dL   GFR calc non Af Amer 84 (*) >90 mL/min   GFR calc Af Amer >90  >90 mL/min   Comment: (NOTE)     The eGFR has been calculated using the CKD EPI equation.     This calculation has not been validated in all clinical situations.     eGFR's persistently <90 mL/min signify possible Chronic Kidney     Disease.  CBC WITH DIFFERENTIAL     Status: Abnormal   Collection Time    12/27/13  5:00 AM      Result Value Ref Range   WBC 16.6 (*) 4.0 - 10.5 K/uL   RBC 3.83 (*) 3.87 - 5.11 MIL/uL   Hemoglobin 11.3 (*) 12.0 - 15.0 g/dL   HCT 34.3 (*) 36.0 - 46.0 %   MCV 89.6  78.0 - 100.0 fL   MCH 29.5  26.0 - 34.0 pg   MCHC 32.9  30.0 - 36.0 g/dL   RDW 14.5  11.5 - 15.5 %   Platelets 188  150 - 400 K/uL   Neutrophils Relative % 72  43 - 77 %   Neutro Abs 11.9 (*) 1.7 - 7.7 K/uL   Lymphocytes Relative 17  12 - 46 %   Lymphs Abs 2.8  0.7 - 4.0 K/uL   Monocytes Relative 10  3 - 12 %   Monocytes Absolute 1.6 (*) 0.1 - 1.0 K/uL   Eosinophils Relative 1  0 - 5 %   Eosinophils Absolute 0.2  0.0 - 0.7 K/uL   Basophils Relative 0  0 - 1 %   Basophils Absolute 0.0   0.0 - 0.1 K/uL  PROTIME-INR     Status: Abnormal   Collection Time    12/27/13  5:00 AM      Result Value Ref Range   Prothrombin Time 19.2 (*) 11.6 - 15.2 seconds   INR 1.67 (*) 0.00 - 1.49  PROTIME-INR     Status: Abnormal   Collection Time    12/28/13  5:52 AM      Result Value Ref Range   Prothrombin Time 19.5 (*) 11.6 - 15.2 seconds   INR 1.70 (*) 0.00 - 1.49     HEENT: normal Cardio: RRR and no murmur Resp: CTA B/L and unlabored GI: BS positive and non distended Extremity:  Pulses positive and No Edema Skin:   Intact and Wound C/D/I and dried blood on middle incision site Neuro: Alert/Oriented and Abnormal Motor 2-/5 Left hip and KE, 4/5 ankle, 5/5 RLE, 5/5 in BUE Musc/Skel:  Normal and Extremity tender Left hip Gen NAD   Assessment/Plan: 1. Functional deficits secondary to Left comminuted IT hip fx which require 3+ hours per day of interdisciplinary therapy in a comprehensive inpatient rehab setting. Physiatrist is providing close team supervision and 24 hour management of active medical problems listed below. Physiatrist and rehab team continue to assess barriers to discharge/monitor patient progress toward functional and medical goals. FIM: FIM - Bathing Bathing Steps Patient Completed: Chest;Right Arm;Left Arm;Abdomen;Right upper leg;Left upper leg Bathing: 3: Mod-Patient completes 5-7 50f10 parts or 50-74%  FIM - Upper Body Dressing/Undressing Upper body dressing/undressing steps patient completed: Thread/unthread right bra strap;Thread/unthread left bra strap;Hook/unhook bra;Thread/unthread right sleeve of pullover shirt/dresss;Thread/unthread left sleeve of pullover shirt/dress;Put head through opening of pull over shirt/dress;Pull shirt over trunk Upper body dressing/undressing: 5: Set-up assist to:  Obtain clothing/put away FIM - Lower Body Dressing/Undressing Lower body dressing/undressing: 1: Total-Patient completed less than 25% of tasks  FIM -  Toileting Toileting steps completed by patient: Performs perineal hygiene Toileting Assistive Devices: Grab bar or rail for support Toileting: 2: Max-Patient completed 1 of 3 steps  FIM - Radio producer Devices: Recruitment consultant Transfers: 3-To toilet/BSC: Mod A (lift or lower assist);3-From toilet/BSC: Mod A (lift or lower assist)  FIM - Bed/Chair Transfer Bed/Chair Transfer Assistive Devices: Arm rests;Walker Bed/Chair Transfer: 3: Bed > Chair or W/C: Mod A (lift or lower assist);3: Supine > Sit: Mod A (lifting assist/Pt. 50-74%/lift 2 legs  FIM - Locomotion: Wheelchair Distance: 150 with total A secondary to R shoulder bursitis Locomotion: Wheelchair: 1: Total Assistance/staff pushes wheelchair (Pt<25%) FIM - Locomotion: Ambulation Locomotion: Ambulation Assistive Devices: Administrator Ambulation/Gait Assistance: 3: Mod assist Locomotion: Ambulation: 1: Travels less than 50 ft with moderate assistance (Pt: 50 - 74%)  Comprehension Comprehension Mode: Auditory Comprehension: 7-Follows complex conversation/direction: With no assist  Expression Expression Mode: Verbal Expression: 7-Expresses complex ideas: With no assist  Social Interaction Social Interaction: 7-Interacts appropriately with others - No medications needed.  Problem Solving Problem Solving: 6-Solves complex problems: With extra time  Memory Memory: 7-Complete Independence: No helper  Medical Problem List and Plan:  Left comminuted intertrochanteric hip fracture status post IM rod  1. Protein C deficiency/ H/o DVT /Anticoagulation: Pharmaceutical: Coumadin--INR goal 1.50 - 2.0 due to history of bleeding (eyes)  2. Pain Management: prn oxycodone. Reports some dizziness due to robaxin--will d/c.  3. Mood: Has positive outlook and motivated to get better. LCSW to follow for evaluation and input.  4. Neuropsych: This patient is capable of making decisions on his own behalf.   5. Hypotension: Hold lisinopril. Monitor orthostatic BP/pulse. Push po fluids.  6. Leucocytosis: Resolving last WBC 16K-- UAneg  7. ABLA: Will recheck in am.  8. Constipation: Increase Senna to bid. Fleets enema today if no results.   LOS (Days) 2 A FACE TO FACE EVALUATION WAS PERFORMED  Charlett Blake 12/28/2013, 7:22 AM

## 2013-12-28 NOTE — Progress Notes (Signed)
Occupational Therapy Session Note  Patient Details  Name: Vanessa Page MRN: [id number] Date of Birth: [birth year]  Today's Date: 12/28/2013 Time: 1425-1450 Time Calculation (min): 25 min  Short Term Goals: Week 1:  OT Short Term Goal 1 (Week 1): Patient will transfer to toilet with min assist OT Short Term Goal 2 (Week 1): Patient will don LB clothing with mod assist using adaptive equipment OT Short Term Goal 3 (Week 1): Patient will transfer to shower with mod assist OT Short Term Goal 4 (Week 1): Patient will complete toileting with mod assist  Skilled Therapeutic Interventions/Progress Updates:    Patient seen this pm for further problem solving relating to shower set up in son's home.  Patient with much improved pain control this pm.  Patient even reported resting between therapies this am.  Patient's husband will photograph shower stall, may need to consider tub seat for shower if unable to clear 3 inch lip.   Taught patient "high hops" with walker encouraging right hip and knee flexion while accepting greater amounts of weight through arms as needed for shower stall transfer.  Also addressed more dynamic stand balance with and without UE support to improve independence with standing aspects of bathing and dressing, also household management - cooking and light housekeeping.    Therapy Documentation Precautions:  Precautions Precautions: Fall Precaution Comments: TDWB, L 25% weight bearing Restrictions Weight Bearing Restrictions: Yes LLE Weight Bearing: Touchdown weight bearing LLE Partial Weight Bearing Percentage or Pounds: 25%   Pain: 2/10 at rest, 5/10 with activity  See FIM for current functional status  Therapy/Group: Individual Therapy  Mariah Milling 12/28/2013, 2:54 PM

## 2013-12-28 NOTE — Progress Notes (Signed)
Social Work Assessment and Plan  Patient Details  Name: Azha Constantin MRN: [id number] Date of Birth: [birth year]  Today's Date: 12/28/2013  Problem List:  Patient Active Problem List   Diagnosis Date Noted  . Hip fracture requiring operative repair 12/26/2013  . Leukocytosis, unspecified 12/25/2013  . Intertrochanteric fracture of left hip 12/23/2013  . Hip fracture 12/23/2013  . Chronic anticoagulation 12/23/2013  . History of DVT of lower extremity 12/23/2013  . Bronchiectasis 12/23/2013  . Essential hypertension 12/23/2013   Past Medical History:  Past Medical History  Diagnosis Date  . Hypertension   . 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 DVT (deep venous thrombosis)   . Bursitis of right shoulder   . Bronchiectasis    Past Surgical History:  Past Surgical History  Procedure Laterality Date  . Intramedullary (im) nail intertrochanteric Left 12/24/2013    Procedure: INTRAMEDULLARY (IM) NAIL INTERTROCHANTRIC;  Surgeon: Mcarthur Rossetti, MD;  Location: [facility];  Service: Orthopedics;  Laterality: Left;   Social History:  reports that she has never smoked. She does not have any smokeless tobacco history on file. She reports that she drinks alcohol. Her drug history is not on file.  Family / Support Systems Marital Status: Married How Long?: 51 years Patient Roles: Spouse;Parent;Other (Comment) (grandparent; friend) Spouse/Significant Other: Taneil Lazarus - husband  [phone] Children: Jaedin Trumbo - son [phone]   Sheyla Zaffino - dtr-in-law  [phone] Other Supports: son in [place], [place] Anticipated Caregiver: Husband Ability/Limitations of Caregiver: Husband retired and can assist.  He stays with patient a lot at bedside. Caregiver Availability: 24/7 Family Dynamics: close, supportive family  Social History Preferred language: English Religion:  Education: college+  pt is a retired Counselling psychologist:  Yes Write: Yes Employment Status: Retired Date Retired/Disabled/Unemployed: 1999 Age Retired: 66 Public relations account executive Issues: None reported Guardian/Conservator: N/A   Abuse/Neglect Physical Abuse: Denies Verbal Abuse: Denies Sexual Abuse: Denies Exploitation of patient/patient's resources: Denies Self-Neglect: Denies  Emotional Status Pt's affect, behavior and adjustment status: Pt is optimistic and upbeat, even through her nausea and pain.  Husband states she's always had that kind of personality. Recent Psychosocial Issues: None reported Pyschiatric History: None Substance Abuse History: None  Patient / Family Perceptions, Expectations & Goals Pt/Family understanding of illness & functional limitations: Pt/husband have a good understanding of pt's condition and limitations.  They feel their questions up to this point have been answered. Premorbid pt/family roles/activities: Pt likes to go for walks in the mountains and photograph wildflowers.  She also likes to visit with family and friends.  Pt/husband attend regular exercise classes at their [facility] and they go to classes at the senior enrichment center in [place], [place]. Anticipated changes in roles/activities/participation: Pt will not be able to do all of her activities, but plans to do them in a modified capacity. Pt/family expectations/goals: Pt wants to "get back on my feet and function independently."  [employer]: Other (Comment) (Pt/husband live in a continuim of care retirement community [facility]) in [place], [place].) Premorbid Home Care/DME Agencies: None Transportation available at discharge: husband  Discharge Planning Living Arrangements: Spouse/significant other [place]: Spouse/significant other;Children;Friends/neighbors;Other (Comment) ([facility]) Type of Residence: Private residence (independent living in a [place]) Insurance Resources: Education officer, museum (specify)  Radiographer, therapeutic) Financial Resources: Fish farm manager;Other (Comment) (retirement) Financial Screen Referred: No Living Expenses: Own Money Management: Patient;Spouse Does the patient have any problems obtaining your medications?: No Home Management: Pt's husband can assist. Patient/Family Preliminary Plans:  Pt will go home to her son's home for a week until she sees the orthopedic surgeon and then her husband will take her home. Barriers to Discharge: Steps Social Work Anticipated Follow Up Needs: HH/OP Expected length of stay: 7-10 days  Clinical Impression CSW met with pt and her husband to introduce self and role of CSW and to complete assessment.  Pt was tired during assessment and dozed on and off as CSW and husband talked.  Pt and husband reside in a continuum of care retirement community [facility]) called [facility] [place] in [place] near [place].  Pt and husband both worked for [employer].  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  They have a son in [place], but no other family near them.  They have a good support network of friends and at the [facility].  CSW spoke with Ernestina Columbia at [facility] and they are expecting that pt will need some f/u support when she goes home.  She can go to their skilled unit or go to receive outpatient therapies at the facility.  CSW will continue to talk with her as pt's needs are known.  Husband is looking at [employer] for a w/c and other DME, but CSW can order what they don't find.  CSW will continue to follow and assist with pt's needs closer to d/c.   Silvestre Mesi Hammad Finkler 12/28/2013, 2:24 PM

## 2013-12-29 ENCOUNTER — Encounter (HOSPITAL_COMMUNITY): Payer: Medicare Other | Admitting: Occupational Therapy

## 2013-12-29 ENCOUNTER — Inpatient Hospital Stay (HOSPITAL_COMMUNITY): Payer: Medicare Other | Admitting: Physical Therapy

## 2013-12-29 ENCOUNTER — Inpatient Hospital Stay (HOSPITAL_COMMUNITY): Payer: Medicare Other | Admitting: Occupational Therapy

## 2013-12-29 LAB — PROTIME-INR
INR: 1.63 — ABNORMAL HIGH (ref 0.00–1.49)
Prothrombin Time: 18.9 seconds — ABNORMAL HIGH (ref 11.6–15.2)

## 2013-12-29 MED ORDER — CYCLOBENZAPRINE HCL 10 MG PO TABS
5.0000 mg | ORAL_TABLET | Freq: Three times a day (TID) | ORAL | Status: DC | PRN
  Administered 2013-12-29 – 2014-01-05 (×7): 5 mg via ORAL
  Filled 2013-12-29 (×8): qty 1

## 2013-12-29 MED ORDER — WARFARIN SODIUM 1 MG PO TABS
1.0000 mg | ORAL_TABLET | Freq: Once | ORAL | Status: AC
  Administered 2013-12-29: 1 mg via ORAL
  Filled 2013-12-29: qty 1

## 2013-12-29 NOTE — IPOC Note (Addendum)
Overall Plan of Care [facility]) Patient Details Name: Vanessa Page MRN: [id number] DOB: 10/23/1930  Admitting Diagnosis: L HIP FX  Hospital Problems: Active Problems:   Hip fracture requiring operative repair     Functional Problem List: Nursing Bowel;Pain;Skin Integrity  PT Balance;Edema;Endurance;Motor;Pain  OT Balance;Pain;Safety;Edema;Endurance;Motor  SLP    TR         Basic ADL's: OT Grooming;Bathing;Dressing;Toileting     Advanced  ADL's: OT       Transfers: PT Bed Mobility;Bed to Chair;Car;Furniture  OT Toilet;Tub/Shower     Locomotion: PT Ambulation;Stairs     Additional Impairments: OT    SLP        TR      Anticipated Outcomes Item Anticipated Outcome  Self Feeding Independent  Swallowing      Basic self-care  Min assist  Toileting  Modified Independent   Bathroom Transfers Mod I  Bowel/Bladder  mod I assist  Transfers  Supervision  Locomotion  Supervision short distances with RW; no w/c goals secondary to R shoulder bursitis  Communication     Cognition     Pain  less than 3 out of 10  Safety/Judgment  mod I    Therapy Plan: PT Intensity: Minimum of 1-2 x/day ,45 to 90 minutes PT Frequency: 5 out of 7 days PT Duration Estimated Length of Stay: 10-12 days OT Intensity: Minimum of 1-2 x/day, 45 to 90 minutes OT Frequency: 5 out of 7 days OT Duration/Estimated Length of Stay: 10 days         Team Interventions: Nursing Interventions Patient/Family Education;Bowel Management;Pain Management;Medication Management;Skin Care/Wound Management  PT interventions Ambulation/gait training;Balance/vestibular training;Discharge planning;DME/adaptive equipment instruction;Functional mobility training;Neuromuscular re-education;Pain management;Patient/family education;Skin care/wound management;Stair training;Therapeutic Activities;Therapeutic Exercise;UE/LE Strength taining/ROM  OT Interventions Balance/vestibular training;Discharge  planning;DME/adaptive equipment instruction;Functional mobility training;Pain management;Skin care/wound managment;Therapeutic Activities;UE/LE Strength taining/ROM;Therapeutic Exercise;Self Care/advanced ADL retraining;Patient/family education;Community reintegration  SLP Interventions    TR Interventions    SW/CM Interventions Discharge Planning;Psychosocial Support;Patient/Family Education    Team Discharge Planning: Destination: PT-Home ,OT- Home , SLP-  Projected Follow-up: PT-Home health PT, OT-  Home health OT, SLP-  Projected Equipment Needs: PT-Rolling walker with 5" wheels;Wheelchair (measurements);Wheelchair cushion (measurements), OT- 3 in 1 bedside comode, SLP-  Equipment Details: PT- , OT-  Patient/family involved in discharge planning: PT- Patient;Family member/caregiver,  OT-Patient;Family member/caregiver, SLP-   MD ELOS: 10-14d Medical Rehab Prognosis:  Good Assessment: 80 y.o. female with history of HTN, DVT-chronic coumadin, Bronchiectasis; who tripped and fell while playing with her grand kids on 12/23/13. She developed immediate onset of left hip pain due to Left comminuted IT hip fracture. INR at admission 1.82 and coumadin held. On 12/24/13, patient underwent IM nailing of left hip by Dr. Ninfa Page   Now requiring 24/7 Rehab RN,MD, as well as CIR level PT, OT .  Treatment team will focus on ADLs and mobility with goals set at Mod I   See Team Conference Notes for weekly updates to the plan of care

## 2013-12-29 NOTE — Progress Notes (Signed)
Coumadin per pharmacy  Anticoagulation: Warfarin resumed from PTA for hx DVT/protein C deficiency. Goal range 1.5-2 (per MD d/t recurrent ocular bleeding). PTA dose was 2 mg daily EXCEPT for 1 mg on Mon/Fri. INR 1.63 << 1.7<1.67<<2.08 << 2.12.  No bleeding  Goal range 1.5-2 (per MD d/t recurrent ocular bleeding).  Plan Coumadin 1 mg x 1 (may need mostly 1mg  daily with one or two 2 mg doses during the week to keep INR 1.5-2.0).  qAM INR

## 2013-12-29 NOTE — Progress Notes (Signed)
Occupational Therapy Session Note  Patient Details  Name: Vanessa Page MRN: [id number] Date of Birth: November 17, 1930  Today's Date: 12/29/2013 Time: 1420-1500 Time Calculation (min): 40 min  Short Term Goals: Week 1:  OT Short Term Goal 1 (Week 1): Patient will transfer to toilet with min assist OT Short Term Goal 2 (Week 1): Patient will don LB clothing with mod assist using adaptive equipment OT Short Term Goal 3 (Week 1): Patient will transfer to shower with mod assist OT Short Term Goal 4 (Week 1): Patient will complete toileting with mod assist  Skilled Therapeutic Interventions/Progress Updates:    Patient seen this pm to address functional transfers, hopping up backwards onto a nearly 4 in. block as needed for shower transfer at son's home.  Patient able to successfully complete on multiple occasions, with backward entry and exit off block.  Practiced use of reacher and long handled shoe horn to doff socks, and don athletic shoes.  Patient ambulated to bathroom with min assist, and completed toileting with close supervision.    Therapy Documentation Precautions:  Precautions Precautions: Fall Precaution Comments: TDWB, L 25% weight bearing Restrictions Weight Bearing Restrictions: Yes LLE Weight Bearing: Touchdown weight bearing LLE Partial Weight Bearing Percentage or Pounds: 25%   Pain: 2/10 at rest See FIM for current functional status  Therapy/Group: Individual Therapy  Mariah Milling 12/29/2013, 3:05 PM

## 2013-12-29 NOTE — Progress Notes (Signed)
Physical Therapy Note  Patient Details  Name: Vanessa Page MRN: [id number] Date of Birth: December 29, 1930 Today's Date: 12/29/2013  Time: 163-846 45 minutes  1:1 Pt c/o "tightness", no c/o pain.  Pt rec'd pain meds prior to session.  Sit to stands multiple reps for strengthening with pt able to maintain TDWB 25% without cuing.  Gait with RW with min A for balance 12', 20' x 2.  Pt reports less discomfort in hip when standing vs sitting on mat.  Seated in w/c pt performed AAROM in all planes for strengthening. Pt encouraged to position hip to prevent IR when seated in w/c.  Pt able to perform w/c mobility 75' without c/o shoulder pain.   Kennith Gain 12/29/2013, 8:55 AM

## 2013-12-29 NOTE — Progress Notes (Signed)
Physical Therapy Session Note  Patient Details  Name: Vanessa Page MRN: [id number] Date of Birth: 1930/10/03  Today's Date: 12/29/2013 Time: 5631-4970 Time Calculation (min): 56 min  Short Term Goals: Week 1:  PT Short Term Goal 1 (Week 1): = LTG secondary to short LOS  Skilled Therapeutic Interventions/Progress Updates:   Pt resting in w/c; pt reports husband is heading back to [place] for a few days but will be back.  Pt with drawings of bed and bathroom that she will use at son's house.  Pt's son can remove box spring from bed to allow it to bed 19" and step in shower has 3" lip that she would have to hop over vs. Back up to and then sit in shower chair.  In gym performed transfers stand pivot bed <> w/c with RW and min A with one episode of posterior LOB with mod A to recover.  On 19" mat performed supine <> sit training to L and to the R with focus on sequencing and use of UE or leg lifter to allow pt to bring LLE onto or off of bed independently.  Pt did have success with leg lifter and requested one for home.  Transitioned to // bars where pt practiced hopping forwards and retro over 3" block x 4 reps with UE support on // bars and min A with only one episode of pt breaking WB precautions.  Pt felt more secure with // bars and may not feel as comfortable hopping with RW.  Will continue to problem solve with pt, family and OT.  Returned to room to rest in w/c before OT B&D.  Therapy Documentation Precautions:  Precautions Precautions: Fall Precaution Comments: TDWB, L 25% weight bearing Restrictions Weight Bearing Restrictions: Yes LLE Weight Bearing: Touchdown weight bearing LLE Partial Weight Bearing Percentage or Pounds: 25% Pain: Pain Assessment Pain Assessment: No/denies pain Pain Score: 3   See FIM for current functional status  Therapy/Group: Individual Therapy  Malachy Mood 12/29/2013, 12:25 PM

## 2013-12-29 NOTE — Progress Notes (Signed)
80 y.o. female with history of HTN, DVT-chronic coumadin, Bronchiectasis; who tripped and fell while playing with her grand kids on 12/23/13. She developed immediate onset of left hip pain due to Left comminuted IT hip fracture. INR at admission 1.82 and coumadin held. On 12/24/13, patient underwent IM nailing of left hip by Dr. Ninfa Linden. CBC today with leucocytosis with WBC-20.6. Has had hypotension as well as poor po intake. Lisinopril held today. Post op TDWB up to 25% for 4 weeks and coumadin resumed   Subjective/Complaints: Some pain last noc releived by meds Having muscle spasms in L thigh Review of Systems - Negative except constipation, no BM since admit Objective: Vital Signs: Blood pressure 135/77, pulse 79, temperature 97.9 F (36.6 C), temperature source Oral, resp. rate 17, height 4' 11"  (1.499 m), weight 55.6 kg (122 lb 9.2 oz), SpO2 97.00%. No results found. Results for orders placed during the hospital encounter of 12/26/13 (from the past 72 hour(s))  URINALYSIS, ROUTINE W REFLEX MICROSCOPIC     Status: Abnormal   Collection Time    12/26/13 10:32 PM      Result Value Ref Range   Color, Urine YELLOW  YELLOW   APPearance CLEAR  CLEAR   Specific Gravity, Urine 1.027  1.005 - 1.030   pH 6.5  5.0 - 8.0   Glucose, UA NEGATIVE  NEGATIVE mg/dL   Hgb urine dipstick NEGATIVE  NEGATIVE   Bilirubin Urine NEGATIVE  NEGATIVE   Ketones, ur NEGATIVE  NEGATIVE mg/dL   Protein, ur NEGATIVE  NEGATIVE mg/dL   Urobilinogen, UA 1.0  0.0 - 1.0 mg/dL   Nitrite NEGATIVE  NEGATIVE   Leukocytes, UA SMALL (*) NEGATIVE  URINE CULTURE     Status: None   Collection Time    12/26/13 10:32 PM      Result Value Ref Range   Specimen Description URINE, CLEAN CATCH     Special Requests NONE     Culture  Setup Time       Value: 12/27/2013 05:08     Performed at [facility]       Value: NO GROWTH     Performed at [facility]   Culture       Value: NO GROWTH   Performed at [facility]   Report Status 12/28/2013 FINAL    URINE MICROSCOPIC-ADD ON     Status: Abnormal   Collection Time    12/26/13 10:32 PM      Result Value Ref Range   Squamous Epithelial / LPF MANY (*) RARE   WBC, UA 3-6  <3 WBC/hpf   Bacteria, UA RARE  RARE  COMPREHENSIVE METABOLIC PANEL     Status: Abnormal   Collection Time    12/27/13  5:00 AM      Result Value Ref Range   Sodium 138  137 - 147 mEq/L   Potassium 3.9  3.7 - 5.3 mEq/L   Chloride 99  96 - 112 mEq/L   CO2 27  19 - 32 mEq/L   Glucose, Bld 107 (*) 70 - 99 mg/dL   BUN 15  6 - 23 mg/dL   Creatinine, Ser 0.59  0.50 - 1.10 mg/dL   Calcium 8.5  8.4 - 10.5 mg/dL   Total Protein 5.9 (*) 6.0 - 8.3 g/dL   Albumin 2.6 (*) 3.5 - 5.2 g/dL   AST 25  0 - 37 U/L   ALT 10  0 - 35 U/L  Alkaline Phosphatase 73  39 - 117 U/L   Total Bilirubin 0.4  0.3 - 1.2 mg/dL   GFR calc non Af Amer 84 (*) >90 mL/min   GFR calc Af Amer >90  >90 mL/min   Comment: (NOTE)     The eGFR has been calculated using the CKD EPI equation.     This calculation has not been validated in all clinical situations.     eGFR's persistently <90 mL/min signify possible Chronic Kidney     Disease.  CBC WITH DIFFERENTIAL     Status: Abnormal   Collection Time    12/27/13  5:00 AM      Result Value Ref Range   WBC 16.6 (*) 4.0 - 10.5 K/uL   RBC 3.83 (*) 3.87 - 5.11 MIL/uL   Hemoglobin 11.3 (*) 12.0 - 15.0 g/dL   HCT 34.3 (*) 36.0 - 46.0 %   MCV 89.6  78.0 - 100.0 fL   MCH 29.5  26.0 - 34.0 pg   MCHC 32.9  30.0 - 36.0 g/dL   RDW 14.5  11.5 - 15.5 %   Platelets 188  150 - 400 K/uL   Neutrophils Relative % 72  43 - 77 %   Neutro Abs 11.9 (*) 1.7 - 7.7 K/uL   Lymphocytes Relative 17  12 - 46 %   Lymphs Abs 2.8  0.7 - 4.0 K/uL   Monocytes Relative 10  3 - 12 %   Monocytes Absolute 1.6 (*) 0.1 - 1.0 K/uL   Eosinophils Relative 1  0 - 5 %   Eosinophils Absolute 0.2  0.0 - 0.7 K/uL   Basophils Relative 0  0 - 1 %   Basophils Absolute 0.0   0.0 - 0.1 K/uL  PROTIME-INR     Status: Abnormal   Collection Time    12/27/13  5:00 AM      Result Value Ref Range   Prothrombin Time 19.2 (*) 11.6 - 15.2 seconds   INR 1.67 (*) 0.00 - 1.49  PROTIME-INR     Status: Abnormal   Collection Time    12/28/13  5:52 AM      Result Value Ref Range   Prothrombin Time 19.5 (*) 11.6 - 15.2 seconds   INR 1.70 (*) 0.00 - 1.49  PROTIME-INR     Status: Abnormal   Collection Time    12/29/13  5:10 AM      Result Value Ref Range   Prothrombin Time 18.9 (*) 11.6 - 15.2 seconds   INR 1.63 (*) 0.00 - 1.49     HEENT: normal Cardio: RRR and no murmur Resp: CTA B/L and unlabored GI: BS positive and non distended Extremity:  Pulses positive and No Edema Skin:   Intact and Wound C/D/I and dried blood on middle incision site Neuro: Alert/Oriented and Abnormal Motor 2-/5 Left hip and KE, 4/5 ankle, 5/5 RLE, 5/5 in BUE Musc/Skel:  Normal and Extremity tender Left hip Gen NAD   Assessment/Plan: 1. Functional deficits secondary to Left comminuted IT hip fx which require 3+ hours per day of interdisciplinary therapy in a comprehensive inpatient rehab setting. Physiatrist is providing close team supervision and 24 hour management of active medical problems listed below. Physiatrist and rehab team continue to assess barriers to discharge/monitor patient progress toward functional and medical goals. FIM: FIM - Bathing Bathing Steps Patient Completed: Chest;Right Arm;Left Arm;Abdomen;Front perineal area;Right upper leg;Left upper leg;Right lower leg (including foot);Buttocks Bathing: 4: Min-Patient completes 8-9 11f10 parts or  75+ percent  FIM - Upper Body Dressing/Undressing Upper body dressing/undressing steps patient completed: Thread/unthread right bra strap;Thread/unthread left bra strap;Hook/unhook bra;Thread/unthread right sleeve of pullover shirt/dresss;Thread/unthread left sleeve of pullover shirt/dress;Put head through opening of pull over  shirt/dress;Pull shirt over trunk Upper body dressing/undressing: 5: Set-up assist to: Obtain clothing/put away FIM - Lower Body Dressing/Undressing Lower body dressing/undressing steps patient completed: Thread/unthread right underwear leg;Pull pants up/down;Don/Doff right shoe;Thread/unthread right pants leg Lower body dressing/undressing: 2: Max-Patient completed 25-49% of tasks  FIM - Toileting Toileting steps completed by patient: Performs perineal hygiene Toileting Assistive Devices: Grab bar or rail for support Toileting: 2: Max-Patient completed 1 of 3 steps  FIM - Radio producer Devices: Elevated toilet seat;Grab bars Toilet Transfers: 3-To toilet/BSC: Mod A (lift or lower assist)  FIM - Control and instrumentation engineer Devices: Arm rests;Walker Bed/Chair Transfer: 4: Bed > Chair or W/C: Min A (steadying Pt. > 75%);4: Chair or W/C > Bed: Min A (steadying Pt. > 75%)  FIM - Locomotion: Wheelchair Distance: 30 Locomotion: Wheelchair: 1: Travels less than 50 ft with minimal assistance (Pt.>75%) FIM - Locomotion: Ambulation Locomotion: Ambulation Assistive Devices: Administrator Ambulation/Gait Assistance: 4: Min assist Locomotion: Ambulation: 1: Travels less than 50 ft with minimal assistance (Pt.>75%)  Comprehension Comprehension Mode: Auditory Comprehension: 7-Follows complex conversation/direction: With no assist  Expression Expression Mode: Verbal Expression: 7-Expresses complex ideas: With no assist  Social Interaction Social Interaction: 7-Interacts appropriately with others - No medications needed.  Problem Solving Problem Solving: 6-Solves complex problems: With extra time  Memory Memory: 7-Complete Independence: No helper  Medical Problem List and Plan:  Left comminuted intertrochanteric hip fracture status post IM rod  1. Protein C deficiency/ H/o DVT /Anticoagulation: Pharmaceutical: Coumadin--INR goal 1.50 -  2.0 due to history of bleeding (eyes)  2. Pain Management: prn oxycodone. Reports some dizziness due to robaxin--change to cyclobenzaprine 3. Mood: Has positive outlook and motivated to get better. LCSW to follow for evaluation and input.  4. Neuropsych: This patient is capable of making decisions on his own behalf.  5. Hypotension: Hold lisinopril. Monitor orthostatic BP/pulse. Push po fluids.  6. Leucocytosis: Resolving last WBC 16K-- UAneg  7. ABLA:monitor h and h  8. Constipation: Increase Senna to bid. Fleets enema today if no results.   LOS (Days) 3 A FACE TO FACE EVALUATION WAS PERFORMED  Charlett Blake 12/29/2013, 6:48 AM

## 2013-12-29 NOTE — Progress Notes (Signed)
Occupational Therapy Session Note  Patient Details  Name: Vanessa Page MRN: [id number] Date of Birth: 11-26-1930  Today's Date: 12/29/2013 Time: 1105-1210 Time Calculation (min): 65 min  Short Term Goals: Week 1:  OT Short Term Goal 1 (Week 1): Patient will transfer to toilet with min assist OT Short Term Goal 2 (Week 1): Patient will don LB clothing with mod assist using adaptive equipment OT Short Term Goal 3 (Week 1): Patient will transfer to shower with mod assist OT Short Term Goal 4 (Week 1): Patient will complete toileting with mod assist  Skilled Therapeutic Interventions/Progress Updates:    Patient seen this am to address activity tolerance, active motion in left hip, adherence to weight bearing restrictions, and use of both DME, and adaptive equipment during basic self care skills.  Patient's husband brought extensive schematic of bedroom and bathroom at son's home to specify our OT practice, specifically relating to shower and bathroom set up.  Patient agreeable to a bedside commode, and was wondering if that should be ordered through home care in [place], or back home.  Assured patient that this therapist would follow up with social worker to get equipment ordered prior to discharge.  Patient completed shower today, using long handled sponge.  She needs reacher to don pants at this time, although may not by time of discharge.  Patient working with RN to determine if muscle relaxant may more effectively allow her to move in therapy, as stiffness in left hip continues to be problematic.    Therapy Documentation Precautions:  Precautions Precautions: Fall Precaution Comments: TDWB, L 25% weight bearing Restrictions Weight Bearing Restrictions: Yes LLE Weight Bearing: Touchdown weight bearing LLE Partial Weight Bearing Percentage or Pounds: 25% General:   Vital Signs:   Pain: 3/10 at rest    See FIM for current functional status  Therapy/Group: Individual  Therapy  Mariah Milling 12/29/2013, 12:19 PM

## 2013-12-30 ENCOUNTER — Inpatient Hospital Stay (HOSPITAL_COMMUNITY): Payer: Medicare Other | Admitting: *Deleted

## 2013-12-30 DIAGNOSIS — D72829 Elevated white blood cell count, unspecified: Secondary | ICD-10-CM

## 2013-12-30 DIAGNOSIS — S72009A Fracture of unspecified part of neck of unspecified femur, initial encounter for closed fracture: Secondary | ICD-10-CM

## 2013-12-30 DIAGNOSIS — Z7901 Long term (current) use of anticoagulants: Secondary | ICD-10-CM

## 2013-12-30 DIAGNOSIS — I10 Essential (primary) hypertension: Secondary | ICD-10-CM

## 2013-12-30 LAB — PROTIME-INR
INR: 1.63 — AB (ref 0.00–1.49)
PROTHROMBIN TIME: 18.9 s — AB (ref 11.6–15.2)

## 2013-12-30 MED ORDER — WARFARIN SODIUM 2 MG PO TABS
2.0000 mg | ORAL_TABLET | Freq: Once | ORAL | Status: AC
  Administered 2013-12-30: 2 mg via ORAL
  Filled 2013-12-30: qty 1

## 2013-12-30 NOTE — Progress Notes (Signed)
Vanessa Page is a 80 y.o. female 10/01/1930 [id number]  Subjective: C/o old bruise on the L inner thigh. No new problems. Slept well. Feeling OK.  Objective: Vital signs in last 24 hours: Temp:  [97.8 F (36.6 C)-98.3 F (36.8 C)] 98.3 F (36.8 C) (04/25 0511) Pulse Rate:  [85-103] 102 (04/25 0513) Resp:  [17-18] 17 (04/25 0511) BP: (106-129)/(61-69) 122/69 mmHg (04/25 0513) SpO2:  [96 %-99 %] 96 % (04/25 0511) Weight change:  Last BM Date: 12/29/13  Intake/Output from previous day: 04/24 0701 - 04/25 0700 In: 600 [P.O.:600] Out: -  Last cbgs: CBG (last 3)  No results found for this basename: GLUCAP,  in the last 72 hours   Physical Exam General: No apparent distress   HEENT: not dry Lungs: Normal effort. Lungs clear to auscultation, no crackles or wheezes. Cardiovascular: Regular rate and rhythm, no edema Abdomen: S/NT/ND; BS(+) Musculoskeletal:  unchanged Neurological: No new neurological deficits Wounds: clean Skin: L inner prox thigh hematoma, large.  Aging changes Mental state: Alert, oriented, cooperative    Lab Results: BMET    Component Value Date/Time   NA 138 12/27/2013 0500   K 3.9 12/27/2013 0500   CL 99 12/27/2013 0500   CO2 27 12/27/2013 0500   GLUCOSE 107* 12/27/2013 0500   BUN 15 12/27/2013 0500   CREATININE 0.59 12/27/2013 0500   CALCIUM 8.5 12/27/2013 0500   GFRNONAA 84* 12/27/2013 0500   GFRAA >90 12/27/2013 0500   CBC    Component Value Date/Time   WBC 16.6* 12/27/2013 0500   RBC 3.83* 12/27/2013 0500   HGB 11.3* 12/27/2013 0500   HCT 34.3* 12/27/2013 0500   PLT 188 12/27/2013 0500   MCV 89.6 12/27/2013 0500   MCH 29.5 12/27/2013 0500   MCHC 32.9 12/27/2013 0500   RDW 14.5 12/27/2013 0500   LYMPHSABS 2.8 12/27/2013 0500   MONOABS 1.6* 12/27/2013 0500   EOSABS 0.2 12/27/2013 0500   BASOSABS 0.0 12/27/2013 0500    Studies/Results: No results found.  Medications: I have reviewed the patient's current medications.  Assessment/Plan:   Left  comminuted intertrochanteric hip fracture status post IM rod  1. Protein C deficiency/ H/o DVT /Anticoagulation: Pharmaceutical: Coumadin--INR goal 1.50 - 2.0 due to history of bleeding (eyes)  2. Pain Management: prn oxycodone. Reports some dizziness due to robaxin--change to cyclobenzaprine  3. Mood: Has positive outlook and motivated to get better. LCSW to follow for evaluation and input.  4. Neuropsych: This patient is capable of making decisions on his own behalf.  5. Hypotension: Hold lisinopril. Monitor orthostatic BP/pulse. Push po fluids.  6. Leucocytosis: Resolving last WBC 16K-- UAneg  7. ABLA:monitor h and h  8. Constipation: Increase Senna to bid. Fleets enema today if no results.  9. L prox medial thigh hematoma - post-traumatic. Will cont to watch - resolving slowly    Length of stay, days: [place] , MD 12/30/2013, 8:48 AM

## 2013-12-30 NOTE — Progress Notes (Signed)
Physical Therapy Session Note  Patient Details  Name: Vanessa Page MRN: [id number] Date of Birth: [birth year]  Today's Date: 12/30/2013 Time: 0231-0301 Time Calculation (min): 30 min    Skilled Therapeutic Interventions/Progress Updates:  Session focused on WH:QPRFFMBW below , gait in II bars with WB precautions, which patient can verbalize and demonstrate. Transfer training sit to supine with verbal cues and set up assistance. Patient able to use AD to get L LE into bed.  W/c mobility with SBA. Patient complained of pain 7/10 ,nursing notified.  Therapy Documentation Precautions:  Precautions Precautions: Fall Precaution Comments: TDWB, L 25% weight bearing Restrictions Weight Bearing Restrictions: Yes LLE Weight Bearing: Touchdown weight bearing LLE Partial Weight Bearing Percentage or Pounds: 25% Pain: Pain Assessment Pain Assessment: 0-10 Pain Score: 6  Pain Type: Surgical pain Pain Location: Leg Pain Orientation: Left Pain Descriptors / Indicators: Sharp Pain Frequency: Intermittent Pain Onset: With Activity Pain Intervention(s): Medication (See eMAR) Mobility: Transfers Sit to Stand: 4: Min assist Locomotion : Ambulation Ambulation/Gait Assistance: 4: Min assist (Parallel bars)  Exercises: General Exercises - Lower Extremity Quad Sets: 15 reps;Seated;Strengthening Repetitive Sit to Stands: Two upper extremities;Other (comment) (5 reps) Total Joint Exercises Ankle Circles/Pumps: Strengthening;AROM;Supine;15 reps Straight Leg Raises: 10 reps;AAROM;Supine  See FIM for current functional status  Therapy/Group: Individual Therapy  [place] Spanish 12/30/2013, 3:20 PM

## 2013-12-30 NOTE — Progress Notes (Signed)
Coumadin per pharmacy  Anticoagulation: Warfarin resumed from PTA for hx DVT/protein C deficiency. Goal range 1.5-2 (per MD d/t recurrent ocular bleeding). PTA dose was 2 mg daily EXCEPT for 1 mg on Mon/Fri. INR 1.63<<1.63 << 1.7<1.67<<2.08 << 2.12.  No bleeding  Goal range 1.5-2 (per MD d/t recurrent ocular bleeding).  Plan Coumadin 2 mg x 1 (may need mostly 1mg  daily with one or two 2 mg doses during the week to keep INR 1.5-2.0).  qAM INR  Albertina Parr, PharmD.  Clinical Pharmacist Pager [phone]

## 2013-12-31 ENCOUNTER — Inpatient Hospital Stay (HOSPITAL_COMMUNITY): Payer: Medicare Other | Admitting: Physical Therapy

## 2013-12-31 ENCOUNTER — Encounter (HOSPITAL_COMMUNITY): Payer: Medicare Other | Admitting: Occupational Therapy

## 2013-12-31 ENCOUNTER — Inpatient Hospital Stay (HOSPITAL_COMMUNITY): Payer: Medicare Other | Admitting: Occupational Therapy

## 2013-12-31 DIAGNOSIS — K5900 Constipation, unspecified: Secondary | ICD-10-CM

## 2013-12-31 LAB — PROTIME-INR
INR: 1.52 — AB (ref 0.00–1.49)
Prothrombin Time: 17.9 seconds — ABNORMAL HIGH (ref 11.6–15.2)

## 2013-12-31 MED ORDER — LINACLOTIDE 145 MCG PO CAPS
145.0000 ug | ORAL_CAPSULE | Freq: Every day | ORAL | Status: DC
  Administered 2013-12-31: 145 ug via ORAL
  Filled 2013-12-31 (×7): qty 1

## 2013-12-31 MED ORDER — WARFARIN SODIUM 2 MG PO TABS
2.0000 mg | ORAL_TABLET | Freq: Once | ORAL | Status: AC
  Administered 2013-12-31: 2 mg via ORAL
  Filled 2013-12-31: qty 1

## 2013-12-31 NOTE — Progress Notes (Signed)
Occupational Therapy Session Note  Patient Details  Name: Vanessa Page MRN: [id number] Date of Birth: 02-15-1931  Today's Date: 12/31/2013 Time: 1000-1055 Time Calculation (min): 55 min  Short Term Goals: Week 1:  OT Short Term Goal 1 (Week 1): Patient will transfer to toilet with min assist OT Short Term Goal 2 (Week 1): Patient will don LB clothing with mod assist using adaptive equipment OT Short Term Goal 3 (Week 1): Patient will transfer to shower with mod assist OT Short Term Goal 4 (Week 1): Patient will complete toileting with mod assist  Skilled Therapeutic Interventions/Progress Updates:    1:1 self care retraining at shower level with focus on sit to stands with proper hand placement, standing balance, functional ambulation, shower stall transfer, problem solving through set up at son's house (place of discharge), use of a reacher for doffing and donning LB clothing to increase independence and management of left LE. Pt with LOB entering into the bathroom as she negotiated the threshold, requiring min A to regain balance. Pt still presents with soreness and weakness in left LE, requiring extra time and her hands to assist with positioning of her LE. Pt able to maintain weight bearing restrictions with functional mobility throughout session.   Therapy Documentation Precautions:  Precautions Precautions: Fall Precaution Comments: TDWB, L 25% weight bearing Restrictions Weight Bearing Restrictions: Yes LLE Weight Bearing: Touchdown weight bearing LLE Partial Weight Bearing Percentage or Pounds: 25% Pain: Pain Assessment Pain Assessment: 0-10 Pain Score: 2  Pain Type: Surgical pain Pain Location: Leg Pain Orientation: Left Pain Descriptors / Indicators: Sore;Tightness Pain Frequency: Intermittent Pain Onset: With Activity Pain Intervention(s): Cold applied;Repositioned;Rest  See FIM for current functional status  Therapy/Group: Individual Therapy  Merrilee Seashore 12/31/2013, 11:39 AM

## 2013-12-31 NOTE — Progress Notes (Signed)
Coumadin per pharmacy  Anticoagulation: Warfarin resumed from PTA for hx DVT/protein C deficiency. Goal range 1.5-2 (per MD d/t recurrent ocular bleeding). PTA dose was 2 mg daily EXCEPT for 1 mg on Mon/Fri. INR 1.52<<1.63<<1.63 << 1.7<1.67<<2.08 << 2.12.  No bleeding  Goal range 1.5-2 (per MD d/t recurrent ocular bleeding).  Plan Repeat Coumadin 2 mg x 1 (may need mostly 1mg  daily with one or two 2 mg doses during the week to keep INR 1.5-2.0).  qAM INR  Albertina Parr, PharmD.  Clinical Pharmacist Pager [phone]

## 2013-12-31 NOTE — Progress Notes (Signed)
Vanessa Page is a 80 y.o. female Nov 22, 1930 [id number]  Subjective: C/o constipation. F/u on old bruise on the L inner thigh. No new problems otherwise. Slept well. Feeling OK.  Objective: Vital signs in last 24 hours: Temp:  [97.7 F (36.5 C)-97.8 F (36.6 C)] 97.8 F (36.6 C) (04/25 2030) Pulse Rate:  [87] 87 (04/25 2030) Resp:  [17-18] 17 (04/25 2030) BP: (107-129)/(60-66) 129/66 mmHg (04/25 2030) SpO2:  [97 %-99 %] 97 % (04/25 2030) Weight change:  Last BM Date: 12/29/13  Intake/Output from previous day: 04/25 0701 - 04/26 0700 In: 720 [P.O.:720] Out: -  Last cbgs: CBG (last 3)  No results found for this basename: GLUCAP,  in the last 72 hours   Physical Exam General: No apparent distress   HEENT: not dry Lungs: Normal effort. Lungs clear to auscultation, no crackles or wheezes. Cardiovascular: Regular rate and rhythm, no edema Abdomen: S/NT/ND; BS(+) Musculoskeletal:  unchanged Neurological: No new neurological deficits Wounds: clean Skin: L inner prox thigh hematoma, large.  Aging changes Mental state: Alert, oriented, cooperative    Lab Results: BMET    Component Value Date/Time   NA 138 12/27/2013 0500   K 3.9 12/27/2013 0500   CL 99 12/27/2013 0500   CO2 27 12/27/2013 0500   GLUCOSE 107* 12/27/2013 0500   BUN 15 12/27/2013 0500   CREATININE 0.59 12/27/2013 0500   CALCIUM 8.5 12/27/2013 0500   GFRNONAA 84* 12/27/2013 0500   GFRAA >90 12/27/2013 0500   CBC    Component Value Date/Time   WBC 16.6* 12/27/2013 0500   RBC 3.83* 12/27/2013 0500   HGB 11.3* 12/27/2013 0500   HCT 34.3* 12/27/2013 0500   PLT 188 12/27/2013 0500   MCV 89.6 12/27/2013 0500   MCH 29.5 12/27/2013 0500   MCHC 32.9 12/27/2013 0500   RDW 14.5 12/27/2013 0500   LYMPHSABS 2.8 12/27/2013 0500   MONOABS 1.6* 12/27/2013 0500   EOSABS 0.2 12/27/2013 0500   BASOSABS 0.0 12/27/2013 0500    Studies/Results: No results found.  Medications: I have reviewed the patient's current  medications.  Assessment/Plan:   Left comminuted intertrochanteric hip fracture status post IM rod  1. Protein C deficiency/ H/o DVT /Anticoagulation: Pharmaceutical: Coumadin--INR goal 1.50 - 2.0 due to history of bleeding (eyes)  2. Pain Management: prn oxycodone. Reports some dizziness due to robaxin--change to cyclobenzaprine  3. Mood: Has positive outlook and motivated to get better. LCSW to follow for evaluation and input.  4. Neuropsych: This patient is capable of making decisions on his own behalf.  5. Hypotension: Hold lisinopril. Monitor orthostatic BP/pulse. Push po fluids.  6. Leucocytosis: Resolving last WBC 16K-- UAneg  7. ABLA:monitor h and h  8. Constipation: Increase Senna to bid. Fleets enema today if no results.  9. L prox medial thigh hematoma - post-traumatic. Will cont to watch - resolving slowly 10. Constipation. Start Linzess    Length of stay, days: [place] , MD 12/31/2013, 8:27 AM

## 2013-12-31 NOTE — Progress Notes (Signed)
Occupational Therapy Session Note  Patient Details  Name: Vanessa Page MRN: [id number] Date of Birth: 1931-07-03  Today's Date: 12/31/2013 Time: 9379-0240 Time Calculation (min): 40 min  Short Term Goals: Week 1:  OT Short Term Goal 1 (Week 1): Patient will transfer to toilet with min assist OT Short Term Goal 2 (Week 1): Patient will don LB clothing with mod assist using adaptive equipment OT Short Term Goal 3 (Week 1): Patient will transfer to shower with mod assist OT Short Term Goal 4 (Week 1): Patient will complete toileting with mod assist  Skilled Therapeutic Interventions/Progress Updates:    !:1 Pt asked to continue to focus on shower stall transfers with setup simulate to son's home environment. Pt needs to be able to clear a 6 inch threshold by "hopping over" it to maintain weight bearing status. Discussed and practiced hopping backwards over a 4inch threshold (into our simulated shower stall) and then forwards over the threshold to come out of the shower. Pt required steady A to get into the shower but required mod A to hop forwards over the ledge with difficulty clearing left LE. Also discussed using a 3:1 right inside the shower to sit on rather than transitioning to the built in seat. The 3:1 we dicussed would give her arm rest to A with sit<>stands and an option to remain seated to bathe periarea and bottom with lid up. Husband present for session.   Therapy Documentation Precautions:  Precautions Precautions: Fall Precaution Comments: TDWB, L 25% weight bearing Restrictions Weight Bearing Restrictions: Yes LLE Weight Bearing: Touchdown weight bearing LLE Partial Weight Bearing Percentage or Pounds: 25% Pain:  soreness in left LE  See FIM for current functional status  Therapy/Group: Individual Therapy  Merrilee Seashore 12/31/2013, 2:45 PM

## 2013-12-31 NOTE — Progress Notes (Signed)
Physical Therapy Session Note  Patient Details  Name: Vanessa Page MRN: [id number] Date of Birth: [birth year]  Today's Date: 12/31/2013 Time: 0805-0906 and 4650-3546 Time Calculation (min): 61 min and 35 min  Short Term Goals: Week 1:  PT Short Term Goal 1 (Week 1): = LTG secondary to short LOS  Skilled Therapeutic Interventions/Progress Updates:   Pt in w/c eating breakfast.  Pt had not had B&D yet and was not wearing TED hose with LLE in more dependent position.  Pt noted to have increased edema LLE; educated pt on keeping LLE elevated in w/c if not wearing TED hose and use of ankle pumps for edema management.  Husband returned from [place].  Continued discussion with pt and husband regarding transportation options ([place]), equipment needs at [employer] (RW, w/c, ?hospital bed), and possibility of temporary ramp vs. Bumping pt in w/c.  Also discussed f/u therapy.  Secondary to pt needing to stay longer at son's house pt will likely need to start HHPT here and then continue HHPT in [place] or begin outpatient if WB status has been upgraded.  Pt and husband verbalized agreement.  Continued transfer training with pt and husband with low, soft bed transfers in ADL apartment to R and L with use of leg lifter with pt performing with supervision-min A and verbal cues for sequence.  Pt also performing stand pivots bed <> w/c with min A with verbal cues for safe hand placement and sequence with improved ability to maintain TDWB.  Also focused on simulated Pilot transfer stand pivot with RW with min A to assist pt with scooting hips back in seat and placing LLE into and out of car.  Will likely need to practice on real Pilot prior to D/C.  Returned to room and ice pack placed on L hip in w/c with LLE elevated.     PM session Pt resting in w/c; provided pt with handouts and pictures of LLE ROM and strengthening HEP in seated and supine positions.  Reviewed each exercise with pt and pt return demonstrated  each exercise; pt reporting 9/10 with exercises but did not wish to request pain medication secondary to pain subsiding when activity ceased.  Following exercises pt reporting a need to use the bathroom to have a BM secondary to laxatives.  Pt performed stand pivot w/c > toilet with UE support on grab bar and min A.  Pt able to doff clothing in standing with no UE support and min A.  Pt to call for RN when finished and ready to return to w/c.  Pt given ice to apply to hip once back in w/c.     Therapy Documentation Precautions:  Precautions Precautions: Fall Precaution Comments: TDWB, L 25% weight bearing Restrictions Weight Bearing Restrictions: Yes LLE Weight Bearing: Touchdown weight bearing LLE Partial Weight Bearing Percentage or Pounds: 25% Pain: Pain Assessment Pain Assessment: 0-10 Pain Score: 2  Pain Type: Surgical pain Pain Location: Leg Pain Orientation: Left Pain Descriptors / Indicators: Sore;Tightness Pain Frequency: Intermittent Pain Onset: With Activity Pain Intervention(s): Cold applied;Repositioned;Rest Locomotion : Ambulation Ambulation/Gait Assistance: 4: Min guard   See FIM for current functional status  Therapy/Group: Individual Therapy  Malachy Mood 12/31/2013, 9:43 AM

## 2014-01-01 ENCOUNTER — Inpatient Hospital Stay (HOSPITAL_COMMUNITY): Payer: Medicare Other | Admitting: Physical Therapy

## 2014-01-01 ENCOUNTER — Inpatient Hospital Stay (HOSPITAL_COMMUNITY): Payer: Medicare Other

## 2014-01-01 ENCOUNTER — Encounter (HOSPITAL_COMMUNITY): Payer: Medicare Other | Admitting: Occupational Therapy

## 2014-01-01 DIAGNOSIS — D72829 Elevated white blood cell count, unspecified: Secondary | ICD-10-CM

## 2014-01-01 DIAGNOSIS — S72143A Displaced intertrochanteric fracture of unspecified femur, initial encounter for closed fracture: Secondary | ICD-10-CM

## 2014-01-01 DIAGNOSIS — I10 Essential (primary) hypertension: Secondary | ICD-10-CM

## 2014-01-01 LAB — CBC
HCT: 36.2 % (ref 36.0–46.0)
Hemoglobin: 12 g/dL (ref 12.0–15.0)
MCH: 29.6 pg (ref 26.0–34.0)
MCHC: 33.1 g/dL (ref 30.0–36.0)
MCV: 89.4 fL (ref 78.0–100.0)
PLATELETS: 353 10*3/uL (ref 150–400)
RBC: 4.05 MIL/uL (ref 3.87–5.11)
RDW: 14.6 % (ref 11.5–15.5)
WBC: 14.6 10*3/uL — ABNORMAL HIGH (ref 4.0–10.5)

## 2014-01-01 LAB — PROTIME-INR
INR: 1.57 — ABNORMAL HIGH (ref 0.00–1.49)
Prothrombin Time: 18.3 seconds — ABNORMAL HIGH (ref 11.6–15.2)

## 2014-01-01 MED ORDER — WARFARIN SODIUM 2 MG PO TABS
2.0000 mg | ORAL_TABLET | Freq: Once | ORAL | Status: AC
  Administered 2014-01-01: 2 mg via ORAL
  Filled 2014-01-01: qty 1

## 2014-01-01 NOTE — Progress Notes (Signed)
Coumadin per pharmacy  Anticoagulation: Warfarin resumed from PTA for hx DVT/protein C deficiency. Goal range 1.5-2 (per MD d/t recurrent ocular bleeding). PTA dose was 2 mg daily EXCEPT for 1 mg on Mon/Fri. INR 1.57 << 1.52<<1.63<<1.63 << 1.7<1.67<<2.08 << 2.12.  No bleeding  Goal range 1.5-2 (per MD d/t recurrent ocular bleeding).  Plan Coumadin 2 mg x 1 (may need mostly 1mg  daily with two or three 2 mg doses during the week to keep INR 1.5-2.0).  qAM INR

## 2014-01-01 NOTE — Progress Notes (Signed)
Occupational Therapy Session Note  Patient Details  Name: Vanessa Page MRN: [id number] Date of Birth: [birth year]  Today's Date: 01/01/2014 Time: 1430-1500 Time Calculation (min): 30 min  Short Term Goals: Week 1:  OT Short Term Goal 1 (Week 1): Patient will transfer to toilet with min assist OT Short Term Goal 2 (Week 1): Patient will don LB clothing with mod assist using adaptive equipment OT Short Term Goal 3 (Week 1): Patient will transfer to shower with mod assist OT Short Term Goal 4 (Week 1): Patient will complete toileting with mod assist  Skilled Therapeutic Interventions/Progress Updates:    Pt received sitting in w/c following PT session. Engaged in discussion regarding discharge planning and home setup at son's house. Practiced functional transfers w/c<>bed and w/c<>chair with arm rests at min assist initially then progressing to supervision while following TDWB precautions. Pt inquiring about concerns upon traveling home after leaving son's home. Discussed importance of stopping to get out of car multiple times throughout trip to prevent DVT. Also discussed use of public restrooms and always using handicap bathroom. At end of session pt left sitting in w/c with ice for L hip and all needs in reach.   Therapy Documentation Precautions:  Precautions Precautions: Fall Precaution Comments: TDWB, L 25% weight bearing Restrictions Weight Bearing Restrictions: Yes LLE Weight Bearing: Touchdown weight bearing LLE Partial Weight Bearing Percentage or Pounds: 25% General:   Vital Signs:   Pain: Pain Assessment Pain Assessment: 0-10 Pain Score: 3  Pain Type: Surgical pain Pain Location: Leg Pain Orientation: Left Pain Descriptors / Indicators: Sharp Pain Frequency: Intermittent Pain Onset: With Activity Patients Stated Pain Goal: 3 Pain Intervention(s): Medication (See eMAR);Repositioned;Emotional support (Medicated prior to therapy) Multiple Pain Sites: No  See FIM  for current functional status  Therapy/Group: Individual Therapy  Belenda Alviar N Kiaria Quinnell 01/01/2014, 3:02 PM

## 2014-01-01 NOTE — Progress Notes (Signed)
Occupational Therapy Session Note  Patient Details  Name: Vanessa Page MRN: [id number] Date of Birth: 10-07-1930  Today's Date: 01/01/2014 Time: 0904-1000 Time Calculation (min): 56 min  Short Term Goals: Week 1:  OT Short Term Goal 1 (Week 1): Patient will transfer to toilet with min assist OT Short Term Goal 2 (Week 1): Patient will don LB clothing with mod assist using adaptive equipment OT Short Term Goal 3 (Week 1): Patient will transfer to shower with mod assist OT Short Term Goal 4 (Week 1): Patient will complete toileting with mod assist  Skilled Therapeutic Interventions/Progress Updates:    Patient seen this am for bathing and dressing.  Patient ambulated from bed to bathroom with rolling walker and close supervision.  Patient showered seated, although reported standing for most of shower this weekend.  Patient reporting generally improving pain in left hip, groin, now with left knee pain.  Left knee is edematous, as is entire length of left femur.  Patient with improved mobility passively in left leg, now needs increased active movement in left leg.  Patient able to don underwear and pants without reacher this am.  Uses long handled shoe horn to don left shoe.    Therapy Documentation Precautions:  Precautions Precautions: Fall Precaution Comments: TDWB, L 25% weight bearing Restrictions Weight Bearing Restrictions: Yes LLE Weight Bearing: Touchdown weight bearing LLE Partial Weight Bearing Percentage or Pounds: 25% Pain: 7/10, requested muscle relaxant from RN  See FIM for current functional status  Therapy/Group: Individual Therapy  Mariah Milling 01/01/2014, 3:24 PM

## 2014-01-01 NOTE — Progress Notes (Signed)
Scheduled senna s held at [facility] per patient's request. Reluctant to take pain meds. Ice applied PRN. Cornell Barman

## 2014-01-01 NOTE — Progress Notes (Signed)
Physical Therapy Session Note  Patient Details  Name: Vanessa Page MRN: [id number] Date of Birth: [birth year]  Today's Date: 01/01/2014 Time: 1000-1100 and 1350-1435 Time Calculation (min): 60 min and 45 min   Short Term Goals: Week 1:  PT Short Term Goal 1 (Week 1): = LTG secondary to short LOS  Skilled Therapeutic Interventions/Progress Updates:   AM Session: Pt received sitting in w/c with husband present for session, agreeable to therapy. W/c propulsion using BUEs room <> gym 120 ft x 2 with supervision. In parallel bars pt performed gait training x multiple laps, able to adhere to TDWB precautions. Pt requires verbal cues to prevent L LE IR in sitting and in standing. In parallel bars, pt performed standing LLE therex for strengthening 2 x 10 each: hip flexion, knee flexion, hip abduction, and hip extension. Pt provided youth RW for appropriate height sizing and performed gait training 20 ft x 2 with min guard. Pt negotiated up/down 4" step using RW ascending backwards with RLE leading and descending backwards with RLE leading x 2 with min assist. Pt and husband reporting interest in smaller w/c and trialled 16x16 chair vs current 18x18 chair with pt preference for smaller w/c and increased ease of access in son's house upon d/c. Pt returned to room and left sitting in w/c with husband, all needs within reach.   PM Session: Pt received sitting in w/c with CSW and husband present, discussing equipment needs after discharge and f/u process with local surgeon and transition back home to [place]. Plan for son to be present this week for training session for bumping patient up/down stairs in w/c. Pt propelled w/c using BUEs and supervision room <> gym, transferred stand pivot using RW and supervision w/c <> mat, sit <> supine supervision on mat with leg lifter for LLE. Pt performed supine LLE therex for strengthening x 15 each: BLE SAQ over bolster, quad sets 3-5 sec hold, glute sets 3-5 sec hold, hip  abd and active assisted heel slides using maxi slide, ankle pumps. Attempted active and active assisted SLR but terminated exercise due to increase in pain. Gait training using RW 30 ft x 2 with min guard. Pt returned to room, ice pack applied to L hip, reviewed HEP handout with therapist, and left sitting in w/c with OT for next session.   Therapy Documentation Precautions:  Precautions Precautions: Fall Precaution Comments: TDWB, L 25% weight bearing Restrictions Weight Bearing Restrictions: Yes LLE Weight Bearing: Touchdown weight bearing LLE Partial Weight Bearing Percentage or Pounds: 25% Pain: Pain Assessment Pain Assessment: 0-10 Pain Score: 7  Pain Type: Surgical pain Pain Location: Leg Pain Orientation: Left Pain Descriptors / Indicators: Sharp Pain Frequency: Intermittent Pain Onset: With Activity (with stair negotiation) Patients Stated Pain Goal: 3 Pain Intervention(s): Rest;Repositioned Multiple Pain Sites: No  See FIM for current functional status  Therapy/Group: Individual Therapy  Laretta Alstrom 01/01/2014, 12:19 PM

## 2014-01-01 NOTE — Progress Notes (Signed)
80 y.o. female with history of HTN, DVT-chronic coumadin, Bronchiectasis; who tripped and fell while playing with her grand kids on 12/23/13. She developed immediate onset of left hip pain due to Left comminuted IT hip fracture. INR at admission 1.82 and coumadin held. On 12/24/13, patient underwent IM nailing of left hip by Dr. Ninfa Linden. CBC today with leucocytosis with WBC-20.6. Has had hypotension as well as poor po intake. Lisinopril held today. Post op TDWB up to 25% for 4 weeks and coumadin resumed   Subjective/Complaints: Some pain last noc releived by meds Noticed increased bruising Left thigh Review of Systems - Negative except constipation, no BM since admit Objective: Vital Signs: Blood pressure 139/75, pulse 87, temperature 98.4 F (36.9 C), temperature source Oral, resp. rate 16, height 4\' 11"  (1.499 m), weight 55.6 kg (122 lb 9.2 oz), SpO2 97.00%. No results found. Results for orders placed during the hospital encounter of 12/26/13 (from the past 72 hour(s))  PROTIME-INR     Status: Abnormal   Collection Time    12/30/13  4:47 AM      Result Value Ref Range   Prothrombin Time 18.9 (*) 11.6 - 15.2 seconds   INR 1.63 (*) 0.00 - 1.49  PROTIME-INR     Status: Abnormal   Collection Time    12/31/13  6:00 AM      Result Value Ref Range   Prothrombin Time 17.9 (*) 11.6 - 15.2 seconds   INR 1.52 (*) 0.00 - 1.49     HEENT: normal Cardio: RRR and no murmur Resp: CTA B/L and unlabored GI: BS positive and non distended Extremity:  Pulses positive and No Edema Skin:   Intact and Wound C/D/I and dried blood on middle incision site Neuro: Alert/Oriented and Abnormal Motor 2-/5 Left hip and KE, 4/5 ankle, 5/5 RLE, 5/5 in BUE Musc/Skel:  Normal and Extremity tender Left hip, ecchymosis Left mid thigh incision extending to popliteal area Gen NAD   Assessment/Plan: 1. Functional deficits secondary to Left comminuted IT hip fx which require 3+ hours per day of interdisciplinary  therapy in a comprehensive inpatient rehab setting. Physiatrist is providing close team supervision and 24 hour management of active medical problems listed below. Physiatrist and rehab team continue to assess barriers to discharge/monitor patient progress toward functional and medical goals. FIM: FIM - Bathing Bathing Steps Patient Completed: Chest;Right Arm;Left Arm;Abdomen;Front perineal area;Buttocks;Right upper leg;Left upper leg;Right lower leg (including foot);Left lower leg (including foot) Bathing: 4: Steadying assist  FIM - Upper Body Dressing/Undressing Upper body dressing/undressing steps patient completed: Thread/unthread right sleeve of pullover shirt/dresss;Thread/unthread left sleeve of pullover shirt/dress;Put head through opening of pull over shirt/dress;Pull shirt over trunk Upper body dressing/undressing: 5: Set-up assist to: Obtain clothing/put away FIM - Lower Body Dressing/Undressing Lower body dressing/undressing steps patient completed: Thread/unthread right underwear leg;Pull underwear up/down;Thread/unthread right pants leg;Thread/unthread left underwear leg;Pull pants up/down;Thread/unthread left pants leg;Don/Doff right shoe;Don/Doff left sock;Don/Doff right sock;Don/Doff left shoe;Fasten/unfasten right shoe;Fasten/unfasten left shoe Lower body dressing/undressing: 4: Steadying Assist  FIM - Toileting Toileting steps completed by patient: Adjust clothing prior to toileting;Adjust clothing after toileting;Performs perineal hygiene Toileting Assistive Devices: Grab bar or rail for support Toileting: 4: Steadying assist  FIM - Radio producer Devices: Grab bars Toilet Transfers: 4-To toilet/BSC: Min A (steadying Pt. > 75%);4-From toilet/BSC: Min A (steadying Pt. > 75%)  FIM - Bed/Chair Transfer Bed/Chair Transfer Assistive Devices: Bed rails;Arm rests Bed/Chair Transfer: 5: Bed > Chair or W/C: Supervision (verbal cues/safety issues);5:  Chair  or W/C > Bed: Supervision (verbal cues/safety issues)  FIM - Locomotion: Wheelchair Distance: 30 Locomotion: Wheelchair: 1: Total Assistance/staff pushes wheelchair (Pt<25%) FIM - Locomotion: Ambulation Locomotion: Ambulation Assistive Devices: Administrator Ambulation/Gait Assistance: 4: Min guard Locomotion: Ambulation: 1: Travels less than 50 ft with minimal assistance (Pt.>75%)  Comprehension Comprehension Mode: Auditory Comprehension: 7-Follows complex conversation/direction: With no assist  Expression Expression Mode: Verbal Expression: 7-Expresses complex ideas: With no assist  Social Interaction Social Interaction: 7-Interacts appropriately with others - No medications needed.  Problem Solving Problem Solving: 7-Solves complex problems: Recognizes & self-corrects  Memory Memory: 7-Complete Independence: No helper  Medical Problem List and Plan:  Left comminuted intertrochanteric hip fracture status post IM rod  1. Protein C deficiency/ H/o DVT /Anticoagulation: Pharmaceutical: Coumadin--INR goal 1.50 - 2.0 due to history of bleeding (eyes)  2. Pain Management: prn oxycodone. Reports some dizziness due to robaxin--change to cyclobenzaprine 3. Mood: Has positive outlook and motivated to get better. LCSW to follow for evaluation and input.  4. Neuropsych: This patient is capable of making decisions on his own behalf.  5. Hypotension: Hold lisinopril. Monitor orthostatic BP/pulse. Push po fluids.  6. Leucocytosis: Resolving last WBC 16K-- UAneg , repeat CBC 7. ABLA:monitor h and h may see drop given ecchymosis 8. Constipation: Increase Senna to bid. Fleets enema today if no results.   LOS (Days) 6 A FACE TO FACE EVALUATION WAS PERFORMED  Charlett Blake 01/01/2014, 7:18 AM

## 2014-01-02 ENCOUNTER — Inpatient Hospital Stay (HOSPITAL_COMMUNITY): Payer: Medicare Other | Admitting: Physical Therapy

## 2014-01-02 ENCOUNTER — Encounter (HOSPITAL_COMMUNITY): Payer: Medicare Other | Admitting: Occupational Therapy

## 2014-01-02 ENCOUNTER — Inpatient Hospital Stay (HOSPITAL_COMMUNITY): Payer: Medicare Other | Admitting: Occupational Therapy

## 2014-01-02 LAB — PROTIME-INR
INR: 1.86 — ABNORMAL HIGH (ref 0.00–1.49)
Prothrombin Time: 20.9 seconds — ABNORMAL HIGH (ref 11.6–15.2)

## 2014-01-02 MED ORDER — WARFARIN SODIUM 1 MG PO TABS
1.0000 mg | ORAL_TABLET | Freq: Once | ORAL | Status: AC
  Administered 2014-01-02: 1 mg via ORAL
  Filled 2014-01-02: qty 1

## 2014-01-02 NOTE — Progress Notes (Signed)
Occupational Therapy Session Note  Patient Details  Name: Vanessa Page MRN: [id number] Date of Birth: [birth year]  Today's Date: 01/02/2014 Time: 6269-4854 Time Calculation (min): 47 min  Short Term Goals: Week 1:  OT Short Term Goal 1 (Week 1): Patient will transfer to toilet with min assist OT Short Term Goal 2 (Week 1): Patient will don LB clothing with mod assist using adaptive equipment OT Short Term Goal 3 (Week 1): Patient will transfer to shower with mod assist OT Short Term Goal 4 (Week 1): Patient will complete toileting with mod assist  Skilled Therapeutic Interventions/Progress Updates:    Patient seen this pm to finalize decisions regarding needed durable medical equipment and adaptive equipment for discharge home to son's home.  Patient's husband present for OT session.  Patient's husband is planning to build a block to reduce the height of clearance for entry exit from shower stall.  Practiced 3.5 inch hop backward onto block, followed by 3.5 inch hop backward over ledge (simulating bathroom conditions in son's home.)   Husband is palnning to rent a temporary ramp, and hospital bed for son's home as well.  Husband has purchased a removable grab bar, and plans also to install a hand held shower unit as well.    Therapy Documentation Precautions:  Precautions Precautions: Fall Precaution Comments: TDWB, L 25% weight bearing Restrictions Weight Bearing Restrictions: Yes LLE Weight Bearing: Touchdown weight bearing LLE Partial Weight Bearing Percentage or Pounds: 25%  Pain:  Does not report pain  See FIM for current functional status  Therapy/Group: Individual Therapy  Mariah Milling 01/02/2014, 4:03 PM

## 2014-01-02 NOTE — Progress Notes (Signed)
Coumadin per pharmacy  Anticoagulation: Warfarin resumed from PTA for hx DVT/protein C deficiency. Goal range 1.5-2 (per MD d/t recurrent ocular bleeding). PTA dose was 2 mg daily EXCEPT for 1 mg on Mon/Fri. INR 1today is 1.86 from <<1.57 << 1.52<<1.63<<1.63 << 1.7<1.67<<2.08 << 2.12.  No bleeding noted.  CBC yesterday improved with H/H 12.0/36.2 and PLTC 353K.   Goal range 1.5-2 (per MD d/t recurrent ocular bleeding).  Plan:  Give Coumadin 1 mg x 1 (may need  1mg  daily with two or three 2 mg doses during the week to keep INR 1.5-2.0).  qAM INR  Nicole Cella, RPh Clinical Pharmacist Pager: [phone] 01/02/2014 12:03 PM

## 2014-01-02 NOTE — Progress Notes (Signed)
Physical Therapy Session Note  Patient Details  Name: Vanessa Page MRN: [id number] Date of Birth: Dec 22, 1930  Today's Date: 01/02/2014 Time: 8938-1017 and 5102-5852 Time Calculation (min): 60 min and 31 min  Short Term Goals: Week 1:  PT Short Term Goal 1 (Week 1): = LTG secondary to short LOS  Skilled Therapeutic Interventions/Progress Updates:    Treatment Session 1: Pt received seated in w/c with husband present; agreeable to therapy. Session focused on hands-on family training, increasing gait stability with personal assistive device. Per pt/husband, temporary ramp to be installed at primary entrance of home prior to discharge.  Therapist transported pt in w/c (secondary to h/o R shoulder bursitis) to outside hospital lobby, where husband was present with SUV in which pt will be leaving hospital at D/C. Therapist explained, demonstrated safe technique for assisting pt wiith stand pivot transfer from w/c<>car using rolling walker; min A required for LLE management with w/c>car transfer; pt utilized leg lifter for LLE management with car>w/c transfer. Husband then gave effective return demonstration of providing min A (as described above) with car transfer.   Returned to rehab unit, where pt performed gait x50' in controlled environment using personal rolling walker requiring supervision; no overt LOB. Session ended in pt room, where pt was left seated in w/c with husband present and all needs within reach.  Treatment Session 2: Pt received seated in w/c; agreeable to therapy. Per pt inquiries concerning HEP, session focused on functional LLE strengthening. See below for detailed description of therapeutic exercises. Therapist departed with pt seated in w/c with husband present and all needs within reach.  Therapy Documentation Precautions:  Precautions Precautions: Fall Precaution Comments: TDWB, L 25% weight bearing Restrictions Weight Bearing Restrictions: Yes LLE Weight Bearing:  Touchdown weight bearing LLE Partial Weight Bearing Percentage or Pounds: 25% Pain: Pain Assessment Pain Assessment: No/denies pain Pain Score: 1  Locomotion : Ambulation Ambulation/Gait Assistance: 4: Min guard  Therapeutic Exercises:  Seated in w/c, pt performed the following to increase functional strength/ROM in LLE to improve stability/independence with functional mobility: L plantar flexion/dorsiflexion (gravity-resisted) x15 reps per direction (x5-second holds); L SAQ with LLE manually supported 1x6 reps, 1x5 reps (to pt fatigue) with tactile cueing at VMO; LAQ 2x5 reps (x2-second holds at end range); manually-assisted L hip/knee flexion AAROM x12 reps.   See FIM for current functional status  Therapy/Group: Individual Therapy  Malva Cogan Hobble 01/02/2014, 12:28 PM

## 2014-01-02 NOTE — Progress Notes (Signed)
80 y.o. female with history of HTN, DVT-chronic coumadin, Bronchiectasis; who tripped and fell while playing with her grand kids on 12/23/13. She developed immediate onset of left hip pain due to Left comminuted IT hip fracture. INR at admission 1.82 and coumadin held. On 12/24/13, patient underwent IM nailing of left hip by Dr. Ninfa Linden. CBC today with leucocytosis with WBC-20.6. Has had hypotension as well as poor po intake. Lisinopril held today. Post op TDWB up to 25% for 4 weeks and coumadin resumed   Subjective/Complaints: Tolerated therapy Discussed Alendronate, hold until pt at home Noticed increased bruising Left thigh Review of Systems - Negative except constipation, no BM since admit Objective: Vital Signs: Blood pressure 109/66, pulse 96, temperature 98.2 F (36.8 C), temperature source Oral, resp. rate 18, height 4\' 11"  (1.499 m), weight 55.6 kg (122 lb 9.2 oz), SpO2 95.00%. No results found. Results for orders placed during the hospital encounter of 12/26/13 (from the past 72 hour(s))  PROTIME-INR     Status: Abnormal   Collection Time    12/31/13  6:00 AM      Result Value Ref Range   Prothrombin Time 17.9 (*) 11.6 - 15.2 seconds   INR 1.52 (*) 0.00 - 1.49  PROTIME-INR     Status: Abnormal   Collection Time    01/01/14  7:32 AM      Result Value Ref Range   Prothrombin Time 18.3 (*) 11.6 - 15.2 seconds   INR 1.57 (*) 0.00 - 1.49  CBC     Status: Abnormal   Collection Time    01/01/14  7:32 AM      Result Value Ref Range   WBC 14.6 (*) 4.0 - 10.5 K/uL   RBC 4.05  3.87 - 5.11 MIL/uL   Hemoglobin 12.0  12.0 - 15.0 g/dL   HCT 36.2  36.0 - 46.0 %   MCV 89.4  78.0 - 100.0 fL   MCH 29.6  26.0 - 34.0 pg   MCHC 33.1  30.0 - 36.0 g/dL   RDW 14.6  11.5 - 15.5 %   Platelets 353  150 - 400 K/uL     HEENT: normal Cardio: RRR and no murmur Resp: CTA B/L and unlabored GI: BS positive and non distended Extremity:  Pulses positive and No Edema Skin:   Intact and Wound C/D/I  and dried blood on middle incision site Neuro: Alert/Oriented and Abnormal Motor 2-/5 Left hip and KE, 4/5 ankle, 5/5 RLE, 5/5 in BUE Musc/Skel:  Normal and Extremity tender Left hip, ecchymosis Left mid thigh incision extending to popliteal area Gen NAD   Assessment/Plan: 1. Functional deficits secondary to Left comminuted IT hip fx which require 3+ hours per day of interdisciplinary therapy in a comprehensive inpatient rehab setting. Physiatrist is providing close team supervision and 24 hour management of active medical problems listed below. Physiatrist and rehab team continue to assess barriers to discharge/monitor patient progress toward functional and medical goals. FIM: FIM - Bathing Bathing Steps Patient Completed: Chest;Right Arm;Left Arm;Abdomen;Front perineal area;Buttocks;Right upper leg;Left upper leg;Right lower leg (including foot);Left lower leg (including foot) Bathing: 5: Supervision: Safety issues/verbal cues  FIM - Upper Body Dressing/Undressing Upper body dressing/undressing steps patient completed: Thread/unthread right bra strap;Thread/unthread left bra strap;Hook/unhook bra;Thread/unthread right sleeve of pullover shirt/dresss;Thread/unthread left sleeve of pullover shirt/dress;Put head through opening of pull over shirt/dress;Pull shirt over trunk Upper body dressing/undressing: 5: Set-up assist to: Obtain clothing/put away FIM - Lower Body Dressing/Undressing Lower body dressing/undressing steps patient completed: Thread/unthread  right underwear leg;Pull underwear up/down;Thread/unthread right pants leg;Thread/unthread left pants leg;Pull pants up/down;Fasten/unfasten pants;Don/Doff left shoe;Don/Doff right shoe;Fasten/unfasten right shoe;Fasten/unfasten left shoe Lower body dressing/undressing: 4: Min-Patient completed 75 plus % of tasks  FIM - Toileting Toileting steps completed by patient: Adjust clothing prior to toileting;Performs perineal hygiene;Adjust  clothing after toileting Toileting Assistive Devices: Grab bar or rail for support Toileting: 5: Supervision: Safety issues/verbal cues  FIM - Radio producer Devices: Grab bars Toilet Transfers: 5-To toilet/BSC: Supervision (verbal cues/safety issues);5-From toilet/BSC: Supervision (verbal cues/safety issues)  FIM - Engineer, site Assistive Devices: Walker;HOB elevated Bed/Chair Transfer: 5: Supine > Sit: Supervision (verbal cues/safety issues);5: Bed > Chair or W/C: Supervision (verbal cues/safety issues)  FIM - Locomotion: Wheelchair Distance: 120 Locomotion: Wheelchair: 2: Travels 50 - 149 ft with supervision, cueing or coaxing FIM - Locomotion: Ambulation Locomotion: Ambulation Assistive Devices: Walker - Rolling;Parallel bars Ambulation/Gait Assistance: 4: Min guard Locomotion: Ambulation: 1: Travels less than 50 ft with minimal assistance (Pt.>75%)  Comprehension Comprehension Mode: Auditory Comprehension: 7-Follows complex conversation/direction: With no assist  Expression Expression Mode: Verbal Expression: 7-Expresses complex ideas: With no assist  Social Interaction Social Interaction: 7-Interacts appropriately with others - No medications needed.  Problem Solving Problem Solving: 7-Solves complex problems: Recognizes & self-corrects  Memory Memory: 7-Complete Independence: No helper  Medical Problem List and Plan:  Left comminuted intertrochanteric hip fracture status post IM rod  1. Protein C deficiency/ H/o DVT /Anticoagulation: Pharmaceutical: Coumadin--INR goal 1.50 - 2.0 due to history of bleeding (eyes)  2. Pain Management: prn oxycodone. Reports some dizziness due to robaxin--change to cyclobenzaprine 3. Mood: Has positive outlook and motivated to get better. LCSW to follow for evaluation and input.  4. Neuropsych: This patient is capable of making decisions on his own behalf.  5. Hypotension: Hold  lisinopril. Monitor orthostatic BP/pulse. Push po fluids.  6. Leucocytosis: Resolving last WBC 14K-- UAneg , repeat CBC 7. ABLA:Hgb normalized 8. Constipation: Increase Senna to bid. Fleets enema today if no results.   LOS (Days) 7 A FACE TO FACE EVALUATION WAS PERFORMED  Charlett Blake 01/02/2014, 7:04 AM

## 2014-01-02 NOTE — Progress Notes (Signed)
Occupational Therapy Session Note  Patient Details  Name: Vanessa Page MRN: [id number] Date of Birth: 05/26/1931  Today's Date: 01/02/2014 Time: 2446-2863 Time Calculation (min): 59 min  Short Term Goals: Week 1:  OT Short Term Goal 1 (Week 1): Patient will transfer to toilet with min assist OT Short Term Goal 2 (Week 1): Patient will don LB clothing with mod assist using adaptive equipment OT Short Term Goal 3 (Week 1): Patient will transfer to shower with mod assist OT Short Term Goal 4 (Week 1): Patient will complete toileting with mod assist  Skilled Therapeutic Interventions/Progress Updates:    Patient seen this am for OT intervention to address dynamic standing balance and passive to active movement in left leg as needed for ADL.   As mobility in left leg motion improves patient less dependent upon adaptive equipment for dressing.  Patient with improved ability to stand dynamically with only minimal use of grab bar or wall for support.    Therapy Documentation Precautions:  Precautions Precautions: Fall Precaution Comments: TDWB, L 25% weight bearing Restrictions Weight Bearing Restrictions: Yes LLE Weight Bearing: Touchdown weight bearing LLE Partial Weight Bearing Percentage or Pounds: 25%   Pain:  Pain 5/10 with activity See FIM for current functional status  Therapy/Group: Individual Therapy  Mariah Milling 01/02/2014, 3:56 PM

## 2014-01-02 NOTE — Progress Notes (Signed)
Subjective:     Patient reports pain as moderate.    Objective: Vital signs in last 24 hours: Temp:  [97.9 F (36.6 C)-98.2 F (36.8 C)] 97.9 F (36.6 C) (04/28 1430) Pulse Rate:  [82-96] 84 (04/28 1430) Resp:  [17-18] 18 (04/28 1430) BP: (109-123)/(55-74) 115/71 mmHg (04/28 1430) SpO2:  [91 %-98 %] 98 % (04/28 1430)  Intake/Output from previous day: 04/27 0701 - 04/28 0700 In: 720 [P.O.:720] Out: -  Intake/Output this shift: Total I/O In: 472 [P.O.:472] Out: -    Recent Labs  01/01/14 0732  HGB 12.0    Recent Labs  01/01/14 0732  WBC 14.6*  RBC 4.05  HCT 36.2  PLT 353   No results found for this basename: NA, K, CL, CO2, BUN, CREATININE, GLUCOSE, CALCIUM,  in the last 72 hours  Recent Labs  01/01/14 0732 01/02/14 0641  INR 1.57* 1.86*    Neurovascular intact Incision: dressing C/D/I Compartment soft Incisions benign Assessment/Plan:     Up with therapy Plan removal of staples at approximately 14 days post-op  Erskine Emery 01/02/2014, 4:08 PM

## 2014-01-02 NOTE — Progress Notes (Signed)
PMR Admission Coordinator Pre-Admission Assessment  Patient: Vanessa Page is an 80 y.o., female  MRN: [id number]  DOB: [birth year]  Height: 5' (152.4 cm)  Weight: 55.339 kg (122 lb)  Insurance Information  HMO: No PPO: PCP: IPA: 80/20: OTHER:  PRIMARY: Medicare A/B Policy#: [id number] Subscriber: Rod Can  CM Name: Phone#: Fax#:  Pre-Cert#: Employer: Retired  Benefits: Phone #: Name: Checked in [place]. Date: 03/07/98 Deduct: $1260 Out of Pocket Max: none Life Max: unlimited  CIR: 100% SNF: 100 days  Outpatient: 80% Co-Pay: 20%  Home Health: 100% Co-Pay: none  DME: 80% Co-Pay: 20%  Providers: patient's choice   SECONDARY: [place] Policy#: [id number] Subscriber: Rod Can  CM Name: Phone#: Fax#:  Pre-Cert#: Employer: Retired  Benefits: Phone #: [phone] Name:  Eff. Date: Deduct: Out of Pocket Max: Life Max:  CIR: SNF:  Outpatient: Co-Pay:  Home Health: Co-Pay:  DME: Co-Pay:  Emergency Contact Information  Contact Information    Name  Relation  Home  Work  Mobile    [place]  Spouse  [phone]        Current Medical History  Patient Admitting Diagnosis: L IT hip Fx  History of Present Illness: An 80 y.o. female with history of HTN, DVT-chronic coumadin, Bronchiectasis; who tripped and fell while playing with her grand kids on 12/23/13. She developed immediate onset of left hip pain due to Left comminuted IT hip fracture. INR at admission 1.82 and coumadin held. On 12/24/13, patient underwent IM nailing of left hip by Dr. Ninfa Linden. CBC today with leucocytosis with WBC-20.6. Post op PWB and PT evaluations done today. CIR recommended by rehab team. Systolic blood pressure today 87, poor oral intake of fluids. Patient lives in [place] in a [place]  Past Medical History  Past Medical History   Diagnosis  Date   .  Hypertension     Family History  family history is not on file.  Prior Rehab/Hospitalizations: Had outpatient therapy 10/14 for right  shoulder bursitis.  Current Medications  Current facility-administered medications:acetaminophen (TYLENOL) suppository 650 mg, 650 mg, Rectal, Q6H PRN, Simbiso Ranga, MD; acetaminophen (TYLENOL) suppository 650 mg, 650 mg, Rectal, Q6H PRN, Mcarthur Rossetti, MD; acetaminophen (TYLENOL) tablet 650 mg, 650 mg, Oral, Q6H PRN, Nat Math, MD; acetaminophen (TYLENOL) tablet 650 mg, 650 mg, Oral, Q6H PRN, Mcarthur Rossetti, MD  albuterol (PROVENTIL) (2.5 MG/3ML) 0.083% nebulizer solution 2.5 mg, 2.5 mg, Nebulization, Q2H PRN, Simbiso Ranga, MD; alum & mag hydroxide-simeth (MAALOX/MYLANTA) 200-200-20 MG/5ML suspension 30 mL, 30 mL, Oral, Q6H PRN, Simbiso Ranga, MD; azelastine (ASTELIN) nasal spray 2 spray, 2 spray, Each Nare, BID PRN, Nat Math, MD  calcium carbonate (OS-CAL - dosed in mg of elemental calcium) tablet 500 mg of elemental calcium, 1 tablet, Oral, Q breakfast, Simbiso Ranga, MD, 500 mg of elemental calcium at 12/25/13 0820; dextrose 5 %-0.9 % sodium chloride infusion, , Intravenous, Continuous, Simbiso Ranga, MD, Last Rate: 50 mL/hr at 12/24/13 1432; fluticasone (FLONASE) 50 MCG/ACT nasal spray 1 spray, 1 spray, Each Nare, Daily, Simbiso Ranga, MD, 1 spray at 12/25/13 1029  guaiFENesin-dextromethorphan (ROBITUSSIN DM) 100-10 MG/5ML syrup 5 mL, 5 mL, Oral, Q4H PRN, Simbiso Ranga, MD; HYDROcodone-acetaminophen (NORCO/VICODIN) 5-325 MG per tablet 1-2 tablet, 1-2 tablet, Oral, Q4H PRN, Simbiso Ranga, MD, 1 tablet at 12/25/13 1158; HYDROcodone-acetaminophen (NORCO/VICODIN) 5-325 MG per tablet 1-2 tablet, 1-2 tablet, Oral, Q6H PRN, Mcarthur Rossetti, MD, 1 tablet at 12/24/13 1948  menthol-cetylpyridinium (CEPACOL) lozenge 3 mg, 1 lozenge, Oral, PRN, Mcarthur Rossetti,  MD; methocarbamol (ROBAXIN) 500 mg in dextrose 5 % 50 mL IVPB, 500 mg, Intravenous, Q6H PRN, Mcarthur Rossetti, MD; methocarbamol (ROBAXIN) tablet 500 mg, 500 mg, Oral, Q8H PRN, Dianne Dun, NP, 500 mg  at 12/24/13 1949; methocarbamol (ROBAXIN) tablet 500 mg, 500 mg, Oral, Q6H PRN, Mcarthur Rossetti, MD, 500 mg at 12/25/13 4259  metoCLOPramide (REGLAN) injection 5-10 mg, 5-10 mg, Intravenous, Q8H PRN, Mcarthur Rossetti, MD; metoCLOPramide (REGLAN) tablet 5-10 mg, 5-10 mg, Oral, Q8H PRN, Mcarthur Rossetti, MD; morphine 2 MG/ML injection 2 mg, 2 mg, Intravenous, Q3H PRN, Nat Math, MD, 2 mg at 12/24/13 1618; morphine 2 MG/ML injection 2 mg, 2 mg, Intravenous, Q2H PRN, Mcarthur Rossetti, MD  multivitamin with minerals tablet 1 tablet, 1 tablet, Oral, Daily, Simbiso Ranga, MD, 1 tablet at 12/25/13 1029; mupirocin ointment (BACTROBAN) 2 %, , Nasal, BID, Mcarthur Rossetti, MD; ondansetron [place] [place]) injection 4 mg, 4 mg, Intravenous, Q6H PRN, Simbiso Ranga, MD; ondansetron (ZOFRAN) injection 4 mg, 4 mg, Intravenous, Q6H PRN, Mcarthur Rossetti, MD; ondansetron [facility]) tablet 4 mg, 4 mg, Oral, Q6H PRN, Simbiso Ranga, MD  ondansetron (ZOFRAN) tablet 4 mg, 4 mg, Oral, Q6H PRN, Mcarthur Rossetti, MD; phenol (CHLORASEPTIC) mouth spray 1 spray, 1 spray, Mouth/Throat, PRN, Mcarthur Rossetti, MD; senna [facility]) tablet 8.6 mg, 1 tablet, Oral, BID, Mcarthur Rossetti, MD, 8.6 mg at 12/25/13 1029; senna-docusate (Senokot-S) tablet 1 tablet, 1 tablet, Oral, QHS PRN, Simbiso Ranga, MD  sodium chloride 0.9 % bolus 500 mL, 500 mL, Intravenous, Once, Velvet Bathe, MD; traMADol Veatrice Bourbon) tablet 100 mg, 100 mg, Oral, Q6H PRN, Mcarthur Rossetti, MD; warfarin (COUMADIN) tablet 1 mg, 1 mg, Oral, ONCE-1800, Julio Sicks, [facility]; Warfarin - Pharmacist Dosing Inpatient, , Does not apply, q1800, Emiliano Dyer, [facility]  Patients Current Diet: General  Precautions / Restrictions  Precautions  Precautions: Fall  Restrictions  Weight Bearing Restrictions: Yes  LLE Weight Bearing: Partial weight bearing  LLE Partial Weight Bearing Percentage or Pounds: 25%  Prior Activity Level  Community  (5-7x/wk): Went out daily. Goes to the gym M-W-F, walks in the mountains, tends garden, and attends senior center lecture series Tues and [employer].  Home Assistive Devices / Equipment  Home Assistive Devices/Equipment: Eyeglasses  Prior Functional Level  Prior Function  Level of Independence: Independent  Current Functional Level  Cognition  Overall Cognitive Status: Within Functional Limits for tasks assessed  Orientation Level: Oriented X4   Extremity Assessment  (includes Sensation/Coordination)     ADLs  Overall ADL's : Needs assistance/impaired  Lower Body Dressing: Sit to/from stand;Maximal assistance  Toilet Transfer: Moderate assistance;BSC;Stand-pivot;Cueing for sequencing;Cueing for safety  Toileting- Clothing Manipulation and Hygiene: Maximal assistance;Sit to/from stand;Cueing for sequencing;Cueing for safety  Functional mobility during ADLs: Moderate assistance;Rolling walker   Mobility  Overal bed mobility: Needs Assistance  Bed Mobility: Sit to Supine  Supine to sit: Mod assist;HOB elevated  Sit to supine: Mod assist  General bed mobility comments: cues on technique, assistance for LLE back onto bed.   Transfers  Overall transfer level: Needs assistance  Equipment used: Rolling walker (2 wheeled)  Transfers: Sit to/from Stand  Sit to Stand: Min assist (from recliner and BSC)  Stand pivot transfers: Mod assist  General transfer comment: cues for PWB, pivot steps to [facility] then took several steps backward to bed after toilet removed.   Ambulation / Gait / Stairs / Wheelchair Mobility  Ambulation/Gait  Ambulation/Gait assistance: +2 safety/equipment;Mod assist  Ambulation Distance (  Feet): 5 Feet  Assistive device: Rolling walker (2 wheeled)  Gait Pattern/deviations: Step-to pattern  General Gait Details: cues for sequence and PWB.   Posture / Balance    Special needs/care consideration  BiPAP/CPAP No  CPM No  Continuous Drip IV D5 and 0.9% NL 50 ml/hr  Dialysis No  Life  Vest No  Oxygen No  Special Bed No  Trach Size No  Wound Vac (area) No  Skin Sees a pediatrist for bunions on feet. Has 3 dressings on left hip surgical sites. Bowel mgmt: Last BM 12/23/13  Bladder mgmt: Catheter out 12/25/13. Using bedpan and BSC to void.  Diabetic mgmt No   Previous Home Environment  Living Arrangements: Spouse/significant other  Home Care Services: No  Additional Comments: visiting from [place] and fell.  Discharge Living Setting  Plans for Discharge Living Setting: Patient's home;House;Lives with (comment) (Hopes to go home to [place] at discharge.)  Type of Home at Discharge: Independent living facility (Lives in continuous care retirement community.)  Discharge Home Layout: One level  Discharge Home Access: Level entry (Is handicap accessible with pull light in bathroom.)  Does the patient have any problems obtaining your medications?: No  Currently staying with son. Son's house is 2 levels with 3 step back entrance and 5 step front entrance. Can stay on main level in the den area if needed, but hopes to discharge to her own home in [place]. It is a 6 hr drive to patient's home. Was down in [place] visiting son and grand children.  Social/Family/Support Systems  Patient Roles: Spouse;Parent (Has husband and 2 sons.)  Contact Information: Lawerance Sabal - spouse  Anticipated Caregiver: Husband  Anticipated Caregiver's Contact Information: Josph Macho [phone]  Ability/Limitations of Caregiver: Husband retired and can assist. He stays with patient a lot.  Caregiver Availability: 24/7  Discharge Plan Discussed with Primary Caregiver: Yes  Is Caregiver In Agreement with Plan?: Yes  Does Caregiver/Family have Issues with Lodging/Transportation while Pt is in Rehab?: No  Goals/Additional Needs  Patient/Family Goal for Rehab: PT/OT mod I goals  Expected length of stay: 7-10 days  Cultural Considerations: Attends [employer] as it is similar to the [place]. Is from [place] and has been here for 50 years.  Dietary Needs: Regular diet, thin liquids  Equipment Needs: TBD  Additional Information: One son lives in [place] and the other lives here in [place].  Pt/Family Agrees to Admission and willing to participate: Yes  Program Orientation Provided & Reviewed with Pt/Caregiver Including Roles & Responsibilities: Yes  Decrease burden of Care through IP rehab admission: N/A  Possible need for SNF placement upon discharge: Not planned  Patient Condition: This patient's condition remains as documented in the consult dated 12/25/13 , in which the Rehabilitation Physician determined and documented that the patient's condition is appropriate for intensive rehabilitative care in an inpatient rehabilitation facility. Will admit to inpatient rehab today.  Preadmission Screen Completed By: Retta Diones, 12/25/2013 5:42 PM  ______________________________________________________________________  Discussed status with Dr. Naaman Plummer on 12/26/13 at 1408 and received telephone approval for admission today.  Admission Coordinator: Retta Diones, time 1408 Sudie Grumbling 12/26/13  Cosigned by: Meredith Staggers, MD [12/26/2013 2:17 PM]   Revision History.Marland KitchenMarland Kitchen

## 2014-01-02 NOTE — Progress Notes (Deleted)
PMR Admission Coordinator Pre-Admission Assessment  Patient: Vanessa Page is an 80 y.o., female MRN: [id number] DOB: 02-26-1931 Height: 4\' 11"  (149.9 cm) Weight: 55.6 kg (122 lb 9.2 oz)              Insurance Information HMO:  No    PPO:       PCP:       IPA:       80/20:       OTHER:   PRIMARY: Medicare A/B      Policy#: [id number]      Subscriber: Rod Can CM Name:        Phone#:       Fax#:   Pre-Cert#:        Employer: Retired Benefits:  Phone #:       Name: Checked in [place]. Date: 03/07/98     Deduct: $1260      Out of Pocket Max: none      Life Max: unlimited CIR: 100%      SNF: 100 days Outpatient: 80%     Co-Pay: 20% Home Health: 100%      Co-Pay: none DME: 80%     Co-Pay: 20% Providers: patient's choice  SECONDARY: [place]      Policy#: [id number]      Subscriber: Rod Can CM Name:        Phone#:       Fax#:   Pre-Cert#:        Employer: Retired Benefits:  Phone #: [phone]     Name:   Eff. Date:       Deduct:        Out of Pocket Max:        Life Max:   CIR:        SNF:   Outpatient:       Co-Pay:   Home Health:        Co-Pay:   DME:       Co-Pay:     Emergency Contact Information Contact Information   Name Relation Home Work Mobile   [place] Spouse [phone]       Current Medical History  Patient Admitting Diagnosis:  L IT hip Fx  History of Present Illness: An 80 y.o. female with history of HTN, DVT-chronic coumadin, Bronchiectasis; who tripped and fell while playing with her grand kids on 12/23/13. She developed immediate onset of left hip pain due to Left comminuted IT hip fracture. INR at admission 1.82 and coumadin held. On 12/24/13, patient underwent IM nailing of left hip by Dr. Ninfa Linden. CBC today with leucocytosis with WBC-20.6. Post op PWB and PT evaluations done today. CIR recommended by rehab team.  Systolic blood pressure today 87, poor oral intake of fluids. Patient lives in [place] in a [place]       Past  Medical History  Past Medical History  Diagnosis Date  . Hypertension   . 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 DVT (deep venous thrombosis)   . Bursitis of right shoulder   . Bronchiectasis     Family History  family history is not on file.  Prior Rehab/Hospitalizations: Had outpatient therapy 10/14 for right shoulder bursitis.   Current Medications  Current facility-administered medications:acetaminophen (TYLENOL) tablet 325-650 mg, 325-650 mg, Oral, Q4H PRN, Ivan Anchors Love, PA-C;  albuterol (PROVENTIL) (2.5 MG/3ML) 0.083% nebulizer solution 2.5 mg, 2.5 mg, Nebulization, Q6H PRN, Charlett Blake, MD;  alum & mag hydroxide-simeth (  MAALOX/MYLANTA) 200-200-20 MG/5ML suspension 30 mL, 30 mL, Oral, Q4H PRN, Ivan Anchors Love, PA-C azelastine (ASTELIN) nasal spray 2 spray, 2 spray, Each Nare, BID PRN, Ivan Anchors Love, PA-C;  bisacodyl (DULCOLAX) suppository 10 mg, 10 mg, Rectal, Daily PRN, Bary Leriche, PA-C, 10 mg at 12/27/13 2129;  calcium carbonate (OS-CAL - dosed in mg of elemental calcium) tablet 500 mg of elemental calcium, 1 tablet, Oral, BID WC, Pamela S Love, PA-C, 500 mg of elemental calcium at 01/02/14 0801 cyclobenzaprine (FLEXERIL) tablet 5 mg, 5 mg, Oral, TID PRN, Charlett Blake, MD, 5 mg at 01/02/14 1019;  diphenhydrAMINE (BENADRYL) 12.5 MG/5ML elixir 12.5-25 mg, 12.5-25 mg, Oral, Q6H PRN, Ivan Anchors Love, PA-C;  guaiFENesin-dextromethorphan (ROBITUSSIN DM) 100-10 MG/5ML syrup 5-10 mL, 5-10 mL, Oral, Q6H PRN, Ivan Anchors Love, PA-C HYDROcodone-acetaminophen (NORCO/VICODIN) 5-325 MG per tablet 1 tablet, 1 tablet, Oral, Q4H PRN, Bary Leriche, PA-C, 1 tablet at 01/02/14 0801;  Linaclotide (LINZESS) capsule 145 mcg, 145 mcg, Oral, Daily, Cassandria Anger, MD, 145 mcg at 12/31/13 0905;  prochlorperazine (COMPAZINE) injection 5-10 mg, 5-10 mg, Intramuscular, Q6H PRN, Bary Leriche, PA-C prochlorperazine (COMPAZINE) suppository 12.5 mg, 12.5 mg, Rectal,  Q6H PRN, Ivan Anchors Love, PA-C;  prochlorperazine (COMPAZINE) tablet 5-10 mg, 5-10 mg, Oral, Q6H PRN, Bary Leriche, PA-C, 5 mg at 12/27/13 2237;  senna-docusate (Senokot-S) tablet 2 tablet, 2 tablet, Oral, BID, Pamela S Love, PA-C, 2 tablet at 01/02/14 0801;  sodium phosphate (FLEET) 7-19 GM/118ML enema 1 enema, 1 enema, Rectal, Daily PRN, Charlett Blake, MD traMADol Veatrice Bourbon) tablet 50 mg, 50 mg, Oral, QID PRN, Bary Leriche, PA-C, 50 mg at 01/01/14 2022;  traZODone (DESYREL) tablet 25-50 mg, 25-50 mg, Oral, QHS PRN, Ivan Anchors Love, PA-C;  warfarin (COUMADIN) tablet 1 mg, 1 mg, Oral, ONCE-1800, Arman Bogus, [facility];  Warfarin - Pharmacist Dosing Inpatient, , Does not apply, q1800, Rolla Flatten, [facility]  Patients Current Diet: General  Precautions / Restrictions Precautions Precautions: Fall Precaution Comments: TDWB, L 25% weight bearing Restrictions Weight Bearing Restrictions: Yes LLE Weight Bearing: Touchdown weight bearing LLE Partial Weight Bearing Percentage or Pounds: 25%   Prior Activity Level    Home Assistive Devices / [place] Devices/Equipment: Grab bars in shower;Built-in shower seat  Prior Functional Level Prior Function Comments: Exercises 3x/week in the gym, active with grandchildren  Current Functional Level Cognition  Arousal/Alertness: Awake/alert Overall Cognitive Status: Within Functional Limits for tasks assessed Orientation Level: Oriented X4 Attention: Alternating Alternating Attention: Appears intact Memory: Appears intact Awareness: Appears intact Problem Solving: Appears intact Safety/Judgment: Appears intact    Extremity Assessment (includes Sensation/Coordination)          ADLs       Mobility       Transfers       Ambulation / Gait / Stairs / Wheelchair Mobility  Ambulation/Gait Ambulation Distance (Feet): 3 Feet Number of Stairs: 1 Wheelchair Mobility Distance: 120    Posture / Balance Static Sitting  Balance Static Sitting - Balance Support: No upper extremity supported;Feet unsupported Static Sitting - Level of Assistance: 4: Min assist Dynamic Sitting Balance Dynamic Sitting - Balance Support: Bilateral upper extremity supported;Feet supported Dynamic Sitting - Level of Assistance: 4: Min assist Dynamic Sitting - Balance Activities: Reaching for objects;Forward lean/weight shifting Sitting balance - Comments: Very limited weight shift forward or to left in sitting Static Standing Balance Static Standing - Balance Support: Bilateral upper extremity supported;During functional activity Static Standing - Level of Assistance:  3: Mod assist Static Standing - Comment/# of Minutes: 15 seconds during bathing / dressing tasks Dynamic Standing Balance Dynamic Standing - Balance Support: During functional activity;Bilateral upper extremity supported Dynamic Standing - Level of Assistance: 3: Mod assist Dynamic Standing - Balance Activities: Forward lean/weight shifting    Special needs/care consideration BiPAP/CPAP No CPM No Continuous Drip IV D5 and 0.9% NL 50 ml/hr Dialysis No        Life Vest No Oxygen No Special Bed No Trach Size No Wound Vac (area) No      Skin Sees a pediatrist for bunions on feet.  Has 3 dressings on left hip surgical sites.       Bowel mgmt: Last BM 12/23/13 Bladder mgmt:  Catheter out 12/25/13.  Using bedpan and BSC to void. Diabetic mgmt No    Previous Home Environment Living Arrangements: Spouse/significant other  Lives With: Spouse Available Help at Discharge: Available 24 hours/day;Family Type of Home: House (Initially returning to son's home untilk able to return to [place]) Home Layout: Two level;Able to live on main level with bedroom/bathroom Home Access: Stairs to enter Entrance Stairs-Rails: None Entrance Stairs-Number of Steps: 3 Bathroom Shower/Tub: Multimedia programmer: [place]: No Additional Comments:  visiting from [place] and [place].  Discharge Living Setting Plans for Discharge Living Setting: Patient's home;House;Lives with (comment) (Hopes to go home to [place] at discharge.) Type of Home at Discharge: Independent living facility (Lives in continuous care retirement community.) Discharge Home Layout: One level Discharge Home Access: Level entry (Is handicap accessible with pull light in bathroom.) Does the patient have any problems obtaining your medications?: No Currently staying with son.  Son's house is 2 levels with 3 step back entrance and 5 step front entrance. Can stay on main level in the den area if needed, but hopes to discharge to her own home in [place].  It is a 6 hr drive to patient's home.  Was down in [place] visiting son and grand children.  Social/Family/Support Systems Patient Roles: Spouse;Parent;Other (Comment) (grandparent; friend) Anticipated Caregiver: Husband Ability/Limitations of Caregiver: Husband retired and can assist.  He stays with patient a lot at bedside. Caregiver Availability: 24/7  Goals/Additional Needs Expected length of stay: 7-10 days  Decrease burden of Care through IP rehab admission:  N/A  Possible need for SNF placement upon discharge: Not planned  Patient Condition: This patient's condition remains as documented in the consult dated 12/25/13  , in which the Rehabilitation Physician determined and documented that the patient's condition is appropriate for intensive rehabilitative care in an inpatient rehabilitation facility. Will admit to inpatient rehab today.  Preadmission Screen Completed By:  Cleatrice Burke, 01/02/2014 3:17 PM ______________________________________________________________________   Discussed status with Dr. Naaman Plummer on 12/26/13 at  1408  and received telephone approval for admission today.  Admission Coordinator:  Cleatrice Burke, time 1408 Sudie Grumbling 12/26/13

## 2014-01-03 ENCOUNTER — Inpatient Hospital Stay (HOSPITAL_COMMUNITY): Payer: Medicare Other

## 2014-01-03 ENCOUNTER — Inpatient Hospital Stay (HOSPITAL_COMMUNITY): Payer: Medicare Other | Admitting: Physical Therapy

## 2014-01-03 ENCOUNTER — Encounter (HOSPITAL_COMMUNITY): Payer: Medicare Other | Admitting: Occupational Therapy

## 2014-01-03 LAB — PROTIME-INR
INR: 1.61 — ABNORMAL HIGH (ref 0.00–1.49)
Prothrombin Time: 18.7 seconds — ABNORMAL HIGH (ref 11.6–15.2)

## 2014-01-03 MED ORDER — WARFARIN SODIUM 2 MG PO TABS
2.0000 mg | ORAL_TABLET | Freq: Once | ORAL | Status: AC
  Administered 2014-01-03: 2 mg via ORAL
  Filled 2014-01-03: qty 1

## 2014-01-03 MED ORDER — DICLOFENAC SODIUM 1 % TD GEL
2.0000 g | Freq: Four times a day (QID) | TRANSDERMAL | Status: DC
  Administered 2014-01-03 – 2014-01-05 (×8): 2 g via TOPICAL
  Filled 2014-01-03: qty 100

## 2014-01-03 NOTE — Progress Notes (Signed)
Physical Therapy Session Note  Patient Details  Name: Vanessa Page MRN: [id number] Date of Birth: [birth year]  Today's Date: 01/03/2014 Time: 0938-1829 Time Calculation (min): 55 min  Skilled Therapeutic Interventions/Progress Updates:  1:1. Pt received sitting in w/c, ready for therapy. Focus this session on d/c planning as well as B LE therex. Pt informed therapist that the w/c ramp had rented can not be delivered in time for d/c, discussion regarding entry into home via w/c bumping. Pt transported by w/c room<>gym for energy conservation. Pt req mod A for t/f sup<>sit on tx mat due to absence of leg lifter. Pt with fair tolerance to completion of HEP, 2-3 reps of each exercises w/ cues for correction of technique as needed. Pt sitting in w/c at end of session w/ all needs in reach, nurse in room.   Therapy Documentation Precautions:  Precautions Precautions: Fall Precaution Comments: TDWB, L 25% weight bearing Restrictions Weight Bearing Restrictions: Yes LLE Weight Bearing: Touchdown weight bearing LLE Partial Weight Bearing Percentage or Pounds: 25%  See FIM for current functional status  Therapy/Group: Individual Therapy  Gilmore Laroche 01/03/2014, 5:20 PM

## 2014-01-03 NOTE — Progress Notes (Signed)
Social Work Patient ID: Vanessa Page, female   DOB: October 09, 1930, 80 y.o.   MRN: [id number]  Vanessa Mesi Annaliz Aven, LCSW Social Worker Signed  Patient Care Conference Service date: 01/03/2014 2:16 PM  Inpatient RehabilitationTeam Conference and Plan of Care Update Date: 01/03/2014   Time: 11:00 AM     Patient Name: Vanessa Page       Medical Record Number: [id number]   Date of Birth: Jan 13, 1931 Sex: Female         Room/Bed: 4M06C/4M06C-01 Payor Info: Payor: MEDICARE / Plan: MEDICARE PART A AND B / Product Type: *No Product type* /   Admitting Diagnosis: L HIP FX   Admit Date/Time:  12/26/2013  3:03 PM Admission Comments: No comment available   Primary Diagnosis:  <principal problem not specified> Principal Problem: <principal problem not specified>    Patient Active Problem List     Diagnosis  Date Noted   .  Hip fracture requiring operative repair  12/26/2013   .  Leukocytosis, unspecified  12/25/2013   .  Intertrochanteric fracture of left hip  12/23/2013   .  Hip fracture  12/23/2013   .  Chronic anticoagulation  12/23/2013   .  History of DVT of lower extremity  12/23/2013   .  Bronchiectasis  12/23/2013   .  Essential hypertension  12/23/2013     Expected Discharge Date: Expected Discharge Date: 01/05/14  Team Members Present: Physician leading conference: Dr. Alysia Penna Social Worker Present: Alfonse Alpers, LCSW Nurse Present: Elliot Cousin, RN PT Present: Raylene Everts, PT;Caroline Lacinda Axon, PT;Blair Hobble, PT OT Present: Antony Salmon, OT SLP Present: Gunnar Fusi, SLP PPS Coordinator present : Daiva Nakayama, RN, CRRN;Becky Alwyn Ren, PT        Current Status/Progress  Goal  Weekly Team Focus   Medical     Left IT hip fracture, anemia stabilized, mild leukocytosis improving  Maintain medical stability, pain management  Adjust medications   Bowel/Bladder     n/a       Swallow/Nutrition/ Hydration            ADL's     min assist  Min assist / supervision   increase LE mobility   Mobility     Supervision with gait and basic transfers; Min A with bed mobility, stairs  Supervision-Min A  Gait training, functional standing balance, D/C planning   Communication            Safety/Cognition/ Behavioral Observations    n/a       Pain     using vicodin and flexeril during the day and ultram as HS  Pain managed at or below level 3 with minimal pain med prn  monitor need for prn meds and effectivess of medications   Skin     bruising and edema lft LE  Healing bruising to surgical leg  monitor skin q shift    Rehab Goals Patient on target to meet rehab goals: Yes Rehab Goals Revised: None *See Care Plan and progress notes for long and short-term goals.    Barriers to Discharge:  See above      Possible Resolutions to Barriers:    See above, continue      Discharge Planning/Teaching Needs:    Pt will be going home to her son's home with her husband initially and then back to her home in [place] with her husband in their independent home within a [facility].   Husband is present at bedside most of the  time.  He has been receiving training/family education.    Team Discussion:    Pt is having better pain control, although now her left knee is hurting.  Dr. Letta Pate will start gel for knee pain.  Pt's anemia is improved and her white count is coming down.  Pt has better movement overall and her thigh swelling is better.  Team feels pt will be ready for d/c as targeted on 01-05-14.   Revisions to Treatment Plan:    None    Continued Need for Acute Rehabilitation Level of Care: The patient requires daily medical management by a physician with specialized training in physical medicine and rehabilitation for the following conditions: Daily direction of a multidisciplinary physical rehabilitation program to ensure safe treatment while eliciting the highest outcome that is of practical value to the patient.: Yes Daily medical  management of patient stability for increased activity during participation in an intensive rehabilitation regime.: Yes Daily analysis of laboratory values and/or radiology reports with any subsequent need for medication adjustment of medical intervention for : Neurological problems;Other  Vanessa Page 01/03/2014, 2:16 PM

## 2014-01-03 NOTE — Patient Care Conference (Signed)
Inpatient RehabilitationTeam Conference and Plan of Care Update Date: 01/03/2014   Time: 11:00 AM    Patient Name: Vanessa Page      Medical Record Number: [id number]  Date of Birth: 1930-11-07 Sex: Female         Room/Bed: 4M06C/4M06C-01 Payor Info: Payor: MEDICARE / Plan: MEDICARE PART A AND B / Product Type: *No Product type* /    Admitting Diagnosis: L HIP FX  Admit Date/Time:  12/26/2013  3:03 PM Admission Comments: No comment available   Primary Diagnosis:  <principal problem not specified> Principal Problem: <principal problem not specified>  Patient Active Problem List   Diagnosis Date Noted  . Hip fracture requiring operative repair 12/26/2013  . Leukocytosis, unspecified 12/25/2013  . Intertrochanteric fracture of left hip 12/23/2013  . Hip fracture 12/23/2013  . Chronic anticoagulation 12/23/2013  . History of DVT of lower extremity 12/23/2013  . Bronchiectasis 12/23/2013  . Essential hypertension 12/23/2013    Expected Discharge Date: Expected Discharge Date: 01/05/14  Team Members Present: Physician leading conference: Dr. Alysia Penna Social Worker Present: Alfonse Alpers, LCSW Nurse Present: Elliot Cousin, RN PT Present: Raylene Everts, PT;Caroline Lacinda Axon, PT;Blair Hobble, PT OT Present: Antony Salmon, OT SLP Present: Gunnar Fusi, SLP PPS Coordinator present : Daiva Nakayama, RN, CRRN;Becky Alwyn Ren, PT     Current Status/Progress Goal Weekly Team Focus  Medical   Left IT hip fracture, anemia stabilized, mild leukocytosis improving  Maintain medical stability, pain management  Adjust medications   Bowel/Bladder   n/a         Swallow/Nutrition/ Hydration             ADL's   min assist  Min assist / supervision  increase LE mobility   Mobility   Supervision with gait and basic transfers; Min A with bed mobility, stairs  Supervision-Min A  Gait training, functional standing balance, D/C planning   Communication             Safety/Cognition/  Behavioral Observations  n/a         Pain   using vicodin and flexeril during the day and ultram as HS  Pain managed at or below level 3 with minimal pain med prn  monitor need for prn meds and effectivess of medications   Skin   bruising and edema lft LE  Healing bruising to surgical leg  monitor skin q shift    Rehab Goals Patient on target to meet rehab goals: Yes Rehab Goals Revised: None *See Care Plan and progress notes for long and short-term goals.  Barriers to Discharge: See above    Possible Resolutions to Barriers:  See above, continue    Discharge Planning/Teaching Needs:  Pt will be going home to her son's home with her husband initially and then back to her home in [place] with her husband in their independent home within a [facility] community.  Husband is present at bedside most of the time.  He has been receiving training/family education.   Team Discussion:  Pt is having better pain control, although now her left knee is hurting.  Dr. Letta Pate will start gel for knee pain.  Pt's anemia is improved and her white count is coming down.  Pt has better movement overall and her thigh swelling is better.  Team feels pt will be ready for d/c as targeted on 01-05-14.  Revisions to Treatment Plan:  None   Continued Need for Acute Rehabilitation Level of Care: The patient requires daily medical  management by a physician with specialized training in physical medicine and rehabilitation for the following conditions: Daily direction of a multidisciplinary physical rehabilitation program to ensure safe treatment while eliciting the highest outcome that is of practical value to the patient.: Yes Daily medical management of patient stability for increased activity during participation in an intensive rehabilitation regime.: Yes Daily analysis of laboratory values and/or radiology reports with any subsequent need for medication adjustment of medical intervention for :  Neurological problems;Other  Vanessa Page 01/03/2014, 2:16 PM

## 2014-01-03 NOTE — Progress Notes (Signed)
80 y.o. female with history of HTN, DVT-chronic coumadin, Bronchiectasis; who tripped and fell while playing with her grand kids on 12/23/13. She developed immediate onset of left hip pain due to Left comminuted IT hip fracture. INR at admission 1.82 and coumadin held. On 12/24/13, patient underwent IM nailing of left hip by Dr. Ninfa Linden. CBC today with leucocytosis with WBC-20.6. Has had hypotension as well as poor po intake. Lisinopril held today. Post op TDWB up to 25% for 4 weeks and coumadin resumed   Subjective/Complaints: Tolerated therapy Discussed Alendronate, hold until pt at home Left knee pain , anterior some hx of mild knee pain PTA Review of Systems - Negative except constipation, no BM since admit Objective: Vital Signs: Blood pressure 107/70, pulse 90, temperature 98 F (36.7 C), temperature source Oral, resp. rate 17, height _0  (1.499 m), weight 55.6 kg (122 lb 9.2 oz), SpO2 95.00%. No results found. Results for orders placed during the hospital encounter of 12/26/13 (from the past 72 hour(s))  PROTIME-INR     Status: Abnormal   Collection Time    01/01/14  7:32 AM      Result Value Ref Range   Prothrombin Time 18.3 (*) 11.6 - 15.2 seconds   INR 1.57 (*) 0.00 - 1.49  CBC     Status: Abnormal   Collection Time    01/01/14  7:32 AM      Result Value Ref Range   WBC 14.6 (*) 4.0 - 10.5 K/uL   RBC 4.05  3.87 - 5.11 MIL/uL   Hemoglobin 12.0  12.0 - 15.0 g/dL   HCT 36.2  36.0 - 46.0 %   MCV 89.4  78.0 - 100.0 fL   MCH 29.6  26.0 - 34.0 pg   MCHC 33.1  30.0 - 36.0 g/dL   RDW 14.6  11.5 - 15.5 %   Platelets 353  150 - 400 K/uL  PROTIME-INR     Status: Abnormal   Collection Time    01/02/14  6:41 AM      Result Value Ref Range   Prothrombin Time 20.9 (*) 11.6 - 15.2 seconds   INR 1.86 (*) 0.00 - 1.49  PROTIME-INR     Status: Abnormal   Collection Time    01/03/14  4:50 AM      Result Value Ref Range   Prothrombin Time 18.7 (*) 11.6 - 15.2 seconds   INR 1.61 (*)  0.00 - 1.49     HEENT: normal Cardio: RRR and no murmur Resp: CTA B/L and unlabored GI: BS positive and non distended Extremity:  Pulses positive and No Edema Skin:   Intact and Wound C/D/I and dried blood on middle incision site Neuro: Alert/Oriented and Abnormal Motor 2-/5 Left hip and KE, 4/5 ankle, 5/5 RLE, 5/5 in BUE Musc/Skel:  Normal and Extremity tender Left hip, ecchymosis Left mid thigh incision extending to popliteal area Tenderness in Left quad tendon, patellar tendon and joint line Gen NAD   Assessment/Plan: 1. Functional deficits secondary to Left comminuted IT hip fx which require 3+ hours per day of interdisciplinary therapy in a comprehensive inpatient rehab setting. Physiatrist is providing close team supervision and 24 hour management of active medical problems listed below. Physiatrist and rehab team continue to assess barriers to discharge/monitor patient progress toward functional and medical goals. Team conference today please see physician documentation under team conference tab, met with team face-to-face to discuss problems,progress, and goals. Formulized individual treatment plan based on medical history, underlying problem  and comorbidities. FIM: FIM - Bathing Bathing Steps Patient Completed: Chest;Right Arm;Left Arm;Abdomen;Left upper leg;Right upper leg;Buttocks;Front perineal area;Right lower leg (including foot);Left lower leg (including foot) Bathing: 5: Supervision: Safety issues/verbal cues  FIM - Upper Body Dressing/Undressing Upper body dressing/undressing steps patient completed: Thread/unthread right bra strap;Thread/unthread left bra strap;Hook/unhook bra;Thread/unthread right sleeve of pullover shirt/dresss;Thread/unthread left sleeve of pullover shirt/dress;Put head through opening of pull over shirt/dress;Pull shirt over trunk Upper body dressing/undressing: 7: Complete Independence: No helper FIM - Lower Body Dressing/Undressing Lower body  dressing/undressing steps patient completed: Thread/unthread right underwear leg;Thread/unthread left underwear leg;Pull underwear up/down;Thread/unthread right pants leg;Pull pants up/down;Fasten/unfasten pants;Don/Doff right sock;Don/Doff right shoe;Don/Doff left shoe;Fasten/unfasten right shoe;Fasten/unfasten left shoe Lower body dressing/undressing: 4: Min-Patient completed 75 plus % of tasks  FIM - Toileting Toileting steps completed by patient: Adjust clothing prior to toileting;Adjust clothing after toileting;Performs perineal hygiene Toileting Assistive Devices: Grab bar or rail for support Toileting: 5: Supervision: Safety issues/verbal cues  FIM - Radio producer Devices: Mining engineer Transfers: 5-To toilet/BSC: Supervision (verbal cues/safety issues);5-From toilet/BSC: Supervision (verbal cues/safety issues)  FIM - Control and instrumentation engineer Devices: Walker (leg lifter) Bed/Chair Transfer: 6: Supine > Sit: No assist;5: Bed > Chair or W/C: Supervision (verbal cues/safety issues)  FIM - Locomotion: Wheelchair Distance: 120 Locomotion: Wheelchair: 1: Total Assistance/staff pushes wheelchair (Pt<25%) FIM - Locomotion: Ambulation Locomotion: Ambulation Assistive Devices: Administrator Ambulation/Gait Assistance: 5: Supervision Locomotion: Ambulation: 2: Travels 50 - 149 ft with supervision/safety issues  Comprehension Comprehension Mode: Auditory Comprehension: 7-Follows complex conversation/direction: With no assist  Expression Expression Mode: Verbal Expression: 7-Expresses complex ideas: With no assist  Social Interaction Social Interaction: 7-Interacts appropriately with others - No medications needed.  Problem Solving Problem Solving: 7-Solves complex problems: Recognizes & self-corrects  Memory Memory: 7-Complete Independence: No helper  Medical Problem List and Plan:  Left comminuted  intertrochanteric hip fracture status post IM rod  1. Protein C deficiency/ H/o DVT /Anticoagulation: Pharmaceutical: Coumadin--INR goal 1.50 - 2.0 due to history of bleeding (eyes)  2. Pain Management: prn oxycodone. Reports some dizziness due to robaxin--changed to cyclobenzaprine, knee pain Left started prior to fall suspect degenerative with exacerbation check Xray 3. Mood: Has positive outlook and motivated to get better. LCSW to follow for evaluation and input.  4. Neuropsych: This patient is capable of making decisions on his own behalf.  5. Hypotension: Hold lisinopril. Monitor orthostatic BP/pulse. Push po fluids.  6. Leucocytosis: Resolving last WBC 14K-- UAneg , repeat CBC 7. ABLA:Hgb normalized 8. Constipation: Increase Senna to bid. Fleets enema today if no results.   LOS (Days) 8 A FACE TO FACE EVALUATION WAS PERFORMED  Charlett Blake 01/03/2014, 7:39 AM

## 2014-01-03 NOTE — Progress Notes (Signed)
PMR Admission Coordinator Pre-Admission Assessment  Patient: Vanessa Page is an 80 y.o., female  MRN: [id number]  DOB: 11/02/1930  Height: 5' (152.4 cm)  Weight: 55.339 kg (122 lb)  Insurance Information  HMO: No PPO: PCP: IPA: 80/20: OTHER:  PRIMARY: Medicare A/B Policy#: [id number] Subscriber: Rod Can  CM Name: Phone#: Fax#:  Pre-Cert#: Employer: Retired  Benefits: Phone #: Name: Checked in [place]. Date: 03/07/98 Deduct: $1260 Out of Pocket Max: none Life Max: unlimited  CIR: 100% SNF: 100 days  Outpatient: 80% Co-Pay: 20%  Home Health: 100% Co-Pay: none  DME: 80% Co-Pay: 20%  Providers: patient's choice   SECONDARY: [place] Policy#: [id number] Subscriber: Rod Can  CM Name: Phone#: Fax#:  Pre-Cert#: Employer: Retired  Benefits: Phone #: [phone] Name:  Eff. Date: Deduct: Out of Pocket Max: Life Max:  CIR: SNF:  Outpatient: Co-Pay:  Home Health: Co-Pay:  DME: Co-Pay:  Emergency Contact Information  Contact Information    Name  Relation  Home  Work  Mobile    [place]  Spouse  [phone]        Current Medical History  Patient Admitting Diagnosis: L IT hip Fx  History of Present Illness: An 80 y.o. female with history of HTN, DVT-chronic coumadin, Bronchiectasis; who tripped and fell while playing with her grand kids on 12/23/13. She developed immediate onset of left hip pain due to Left comminuted IT hip fracture. INR at admission 1.82 and coumadin held. On 12/24/13, patient underwent IM nailing of left hip by Dr. Ninfa Linden. CBC today with leucocytosis with WBC-20.6. Post op PWB and PT evaluations done today. CIR recommended by rehab team. Systolic blood pressure today 87, poor oral intake of fluids. Patient lives in [place] in a [place]  Past Medical History  Past Medical History   Diagnosis  Date   .  Hypertension     Family History  family history is not on file.  Prior Rehab/Hospitalizations: Had outpatient therapy 10/14 for right  shoulder bursitis.  Current Medications  Current facility-administered medications:acetaminophen (TYLENOL) suppository 650 mg, 650 mg, Rectal, Q6H PRN, Simbiso Ranga, MD; acetaminophen (TYLENOL) suppository 650 mg, 650 mg, Rectal, Q6H PRN, Mcarthur Rossetti, MD; acetaminophen (TYLENOL) tablet 650 mg, 650 mg, Oral, Q6H PRN, Nat Math, MD; acetaminophen (TYLENOL) tablet 650 mg, 650 mg, Oral, Q6H PRN, Mcarthur Rossetti, MD  albuterol (PROVENTIL) (2.5 MG/3ML) 0.083% nebulizer solution 2.5 mg, 2.5 mg, Nebulization, Q2H PRN, Simbiso Ranga, MD; alum & mag hydroxide-simeth (MAALOX/MYLANTA) 200-200-20 MG/5ML suspension 30 mL, 30 mL, Oral, Q6H PRN, Simbiso Ranga, MD; azelastine (ASTELIN) nasal spray 2 spray, 2 spray, Each Nare, BID PRN, Nat Math, MD  calcium carbonate (OS-CAL - dosed in mg of elemental calcium) tablet 500 mg of elemental calcium, 1 tablet, Oral, Q breakfast, Simbiso Ranga, MD, 500 mg of elemental calcium at 12/25/13 0820; dextrose 5 %-0.9 % sodium chloride infusion, , Intravenous, Continuous, Simbiso Ranga, MD, Last Rate: 50 mL/hr at 12/24/13 1432; fluticasone (FLONASE) 50 MCG/ACT nasal spray 1 spray, 1 spray, Each Nare, Daily, Simbiso Ranga, MD, 1 spray at 12/25/13 1029  guaiFENesin-dextromethorphan (ROBITUSSIN DM) 100-10 MG/5ML syrup 5 mL, 5 mL, Oral, Q4H PRN, Simbiso Ranga, MD; HYDROcodone-acetaminophen (NORCO/VICODIN) 5-325 MG per tablet 1-2 tablet, 1-2 tablet, Oral, Q4H PRN, Simbiso Ranga, MD, 1 tablet at 12/25/13 1158; HYDROcodone-acetaminophen (NORCO/VICODIN) 5-325 MG per tablet 1-2 tablet, 1-2 tablet, Oral, Q6H PRN, Mcarthur Rossetti, MD, 1 tablet at 12/24/13 1948  menthol-cetylpyridinium (CEPACOL) lozenge 3 mg, 1 lozenge, Oral, PRN, Mcarthur Rossetti,  MD; methocarbamol (ROBAXIN) 500 mg in dextrose 5 % 50 mL IVPB, 500 mg, Intravenous, Q6H PRN, Mcarthur Rossetti, MD; methocarbamol (ROBAXIN) tablet 500 mg, 500 mg, Oral, Q8H PRN, Dianne Dun, NP, 500 mg  at 12/24/13 1949; methocarbamol (ROBAXIN) tablet 500 mg, 500 mg, Oral, Q6H PRN, Mcarthur Rossetti, MD, 500 mg at 12/25/13 7846  metoCLOPramide (REGLAN) injection 5-10 mg, 5-10 mg, Intravenous, Q8H PRN, Mcarthur Rossetti, MD; metoCLOPramide (REGLAN) tablet 5-10 mg, 5-10 mg, Oral, Q8H PRN, Mcarthur Rossetti, MD; morphine 2 MG/ML injection 2 mg, 2 mg, Intravenous, Q3H PRN, Nat Math, MD, 2 mg at 12/24/13 1618; morphine 2 MG/ML injection 2 mg, 2 mg, Intravenous, Q2H PRN, Mcarthur Rossetti, MD  multivitamin with minerals tablet 1 tablet, 1 tablet, Oral, Daily, Simbiso Ranga, MD, 1 tablet at 12/25/13 1029; mupirocin ointment (BACTROBAN) 2 %, , Nasal, BID, Mcarthur Rossetti, MD; ondansetron [facility]) injection 4 mg, 4 mg, Intravenous, Q6H PRN, Simbiso Ranga, MD; ondansetron (ZOFRAN) injection 4 mg, 4 mg, Intravenous, Q6H PRN, Mcarthur Rossetti, MD; ondansetron [facility]) tablet 4 mg, 4 mg, Oral, Q6H PRN, Simbiso Ranga, MD  ondansetron (ZOFRAN) tablet 4 mg, 4 mg, Oral, Q6H PRN, Mcarthur Rossetti, MD; phenol (CHLORASEPTIC) mouth spray 1 spray, 1 spray, Mouth/Throat, PRN, Mcarthur Rossetti, MD; senna [facility]) tablet 8.6 mg, 1 tablet, Oral, BID, Mcarthur Rossetti, MD, 8.6 mg at 12/25/13 1029; senna-docusate (Senokot-S) tablet 1 tablet, 1 tablet, Oral, QHS PRN, Simbiso Ranga, MD  sodium chloride 0.9 % bolus 500 mL, 500 mL, Intravenous, Once, Velvet Bathe, MD; traMADol [facility]at[facility]bon) tablet 100 mg, 100 mg, Oral, Q6H PRN, Mcarthur Rossetti, MD; warfarin (COUMADIN) tablet 1 mg, 1 mg, Oral, ONCE-1800, Julio Sicks, [facility]; Warfarin - Pharmacist Dosing Inpatient, , Does not apply, q1800, Emiliano Dyer, [facility]  Patients Current Diet: General  Precautions / Restrictions  Precautions  Precautions: Fall  Restrictions  Weight Bearing Restrictions: Yes  LLE Weight Bearing: Partial weight bearing  LLE Partial Weight Bearing Percentage or Pounds: 25%  Prior Activity Level  Community  (5-7x/wk): Went out daily. Goes to the gym M-W-F, walks in the mountains, tends garden, and attends senior center lecture series Tues and [employer].  Home Assistive Devices / Equipment  Home Assistive Devices/Equipment: Eyeglasses  Prior Functional Level  Prior Function  Level of Independence: Independent  Current Functional Level  Cognition  Overall Cognitive Status: Within Functional Limits for tasks assessed  Orientation Level: Oriented X4   Extremity Assessment  (includes Sensation/Coordination)     ADLs  Overall ADL's : Needs assistance/impaired  Lower Body Dressing: Sit to/from stand;Maximal assistance  Toilet Transfer: Moderate assistance;BSC;Stand-pivot;Cueing for sequencing;Cueing for safety  Toileting- Clothing Manipulation and Hygiene: Maximal assistance;Sit to/from stand;Cueing for sequencing;Cueing for safety  Functional mobility during ADLs: Moderate assistance;Rolling walker   Mobility  Overal bed mobility: Needs Assistance  Bed Mobility: Sit to Supine  Supine to sit: Mod assist;HOB elevated  Sit to supine: Mod assist  General bed mobility comments: cues on technique, assistance for LLE back onto bed.   Transfers  Overall transfer level: Needs assistance  Equipment used: Rolling walker (2 wheeled)  Transfers: Sit to/from Stand  Sit to Stand: Min assist (from recliner and BSC)  Stand pivot transfers: Mod assist  General transfer comment: cues for PWB, pivot steps to [facility] then took several steps backward to bed after toilet removed.   Ambulation / Gait / Stairs / Wheelchair Mobility  Ambulation/Gait  Ambulation/Gait assistance: +2 safety/equipment;Mod assist  Ambulation Distance (  Feet): 5 Feet  Assistive device: Rolling walker (2 wheeled)  Gait Pattern/deviations: Step-to pattern  General Gait Details: cues for sequence and PWB.   Posture / Balance    Special needs/care consideration  BiPAP/CPAP No  CPM No  Continuous Drip IV D5 and 0.9% NL 50 ml/hr  Dialysis No  Life  Vest No  Oxygen No  Special Bed No  Trach Size No  Wound Vac (area) No  Skin Sees a pediatrist for bunions on feet. Has 3 dressings on left hip surgical sites. Bowel mgmt: Last BM 12/23/13  Bladder mgmt: Catheter out 12/25/13. Using bedpan and BSC to void.  Diabetic mgmt No   Previous Home Environment  Living Arrangements: Spouse/significant other  Home Care Services: No  Additional Comments: visiting from [place] and fell.  Discharge Living Setting  Plans for Discharge Living Setting: Patient's home;House;Lives with (comment) (Hopes to go home to [place] at discharge.)  Type of Home at Discharge: Independent living facility (Lives in [facility] community[facility]  Discharge Home Layout: One level  Discharge Home Access: Level entry (Is handicap accessible with pull light in bathroom.)  Does the patient have any problems obtaining your medications?: No  Currently staying with son. Son's house is 2 levels with 3 step back entrance and 5 step front entrance. Can stay on main level in the den area if needed, but hopes to discharge to her own home in [place]. It is a 6 hr drive to patient's home. Was down in [place] visiting son and grand children.  Social/Family/Support Systems  Patient Roles: Spouse;Parent (Has husband and 2 sons.)  Contact Information: Lawerance Sabal - spouse  Anticipated Caregiver: Husband  Anticipated Caregiver's Contact Information: Josph Macho [phone]  Ability/Limitations of Caregiver: Husband retired and can assist. He stays with patient a lot.  Caregiver Availability: 24/7  Discharge Plan Discussed with Primary Caregiver: Yes  Is Caregiver In Agreement with Plan?: Yes  Does Caregiver/Family have Issues with Lodging/Transportation while Pt is in Rehab?: No  Goals/Additional Needs  Patient/Family Goal for Rehab: PT/OT mod I goals  Expected length of stay: 7-10 days  Cultural Considerations: Attends [place] as it is similar to the [place]. Is from [place] and has been here for 50 years.  Dietary Needs: Regular diet, thin liquids  Equipment Needs: TBD  Additional Information: One son lives in [place] and the other lives here in [place].  Pt/Family Agrees to Admission and willing to participate: Yes  Program Orientation Provided & Reviewed with Pt/Caregiver Including Roles & Responsibilities: Yes  Decrease burden of Care through IP rehab admission: N/A  Possible need for SNF placement upon discharge: Not planned  Patient Condition: This patient's condition remains as documented in the consult dated 12/25/13 , in which the Rehabilitation Physician determined and documented that the patient's condition is appropriate for intensive rehabilitative care in an inpatient rehabilitation facility. Will admit to inpatient rehab today.  Preadmission Screen Completed By: Retta Diones, 12/25/2013 5:42 PM  ______________________________________________________________________  Discussed status with Dr. Naaman Plummer on 12/26/13 at 1408 and received telephone approval for admission today.  Admission Coordinator: Retta Diones, time 1408 Sudie Grumbling 12/26/13  Cosigned by: Meredith Staggers, MD [12/26/2013 2:17 PM

## 2014-01-03 NOTE — Progress Notes (Signed)
Occupational Therapy Weekly Progress Note  Patient Details  Name: Vanessa Page MRN: [id number] Date of Birth: 08/07/1931  Beginning of progress report period: December 27, 2013 End of progress report period: January 03, 2014  Today's Date: 01/03/2014 Time: 9485-4627 Time Calculation (min): 55 min  Patient has met 4 of 4 short term goals due to improved functional mobility, improved mobility in left leg, decreased edema, and improved pain control.    Patient continues to demonstrate the following deficits: Knee pain, edema, and limited active movement in left leg and therefore will continue to benefit from skilled OT intervention to enhance overall performance with BADL and iADL.  Patient progressing toward long term goals..  Continue plan of care.  OT Short Term Goals Week 1:  OT Short Term Goal 1 (Week 1): Patient will transfer to toilet with min assist OT Short Term Goal 1 - Progress (Week 1): Met OT Short Term Goal 2 (Week 1): Patient will don LB clothing with mod assist using adaptive equipment OT Short Term Goal 2 - Progress (Week 1): Met OT Short Term Goal 3 (Week 1): Patient will transfer to shower with mod assist OT Short Term Goal 3 - Progress (Week 1): Met OT Short Term Goal 4 (Week 1): Patient will complete toileting with mod assist OT Short Term Goal 4 - Progress (Week 1): Met  Short term goals = Long term goals  Skilled Therapeutic Interventions/Progress Updates:   Patient seen this am to further increase independence with basic self care skills.  Utilizing only long shoe horn and long bath brush as adaptive equipment.  Patient's chief complaint this am is left knee pain.  Patient quickly approaching long term goals, and is increasing her ability to direct her care.    Therapy Documentation Precautions:  Precautions Precautions: Fall Precaution Comments: TDWB, L 25% weight bearing Restrictions Weight Bearing Restrictions: Yes LLE Weight Bearing: Touchdown weight  bearing LLE Partial Weight Bearing Percentage or Pounds: 25%   Pain: 9/10 left knee, spoke with MD/RN See FIM for current functional status  Therapy/Group: Individual Therapy  Mariah Milling 01/03/2014, 10:47 AM

## 2014-01-03 NOTE — Progress Notes (Signed)
Coumadin per pharmacy  Anticoagulation: Warfarin resumed from PTA for hx DVT/protein C deficiency. She had a left comminuted IT hip fracture and underwent IM nailing of left hip.  Coumadin resumed post op.  Goal range 1.5-2 (per MD d/t recurrent ocular bleeding). PTA dose was 2 mg daily EXCEPT for 1 mg on Mon/Fri.   INR today is 1.61 (<<1.8<<1.57 << 1.52<<1.63<<1.63 << 1.7<1.67<<2.08 << 2.12.)  No bleeding noted.  CBC 4/27 improved with H/H 12.0/36.2 and PLTC 353K. MD noted increased bruising left thigh (s/p left thigh IM nailing).    Goal range 1.5-2 (per MD d/t recurrent ocular bleeding).  Plan:  Give Coumadin 2 mg x 1 (may need  1mg  daily with two or three 2 mg doses during the week to keep INR 1.5-2.0).  qAM INR  Nicole Cella, RPh Clinical Pharmacist Pager: [phone] 01/03/2014 2:55PM

## 2014-01-03 NOTE — Progress Notes (Signed)
Social Work Patient ID: Vanessa Page, female   DOB: 08/09/1931, 80 y.o.   MRN: [id number]   CSW met with pt and husband to update them on team conference.  CSW also told them about DME being ordered through [facility] and [facility] arranged through [facility].  CSW spoke with Vania Rea at [employer] in [place] and she said that they Page provide outpt therapies for pt with our doctor's order.  Pt's husband is working with [employer] to obtain a temporary ramp outside of pt's son's home.  CSW will continue to work with pt/husband to assure they have what they need for d/c.

## 2014-01-03 NOTE — Progress Notes (Signed)
Occupational Therapy Session Note  Patient Details  Name: Vanessa Page MRN: [id number] Date of Birth: Sep 17, 1930  Today's Date: 01/03/2014 Time: 1430-1500 Time Calculation (min): 30 min  Short Term Goals: Week 1:  OT Short Term Goal 1 (Week 1): Patient will transfer to toilet with min assist OT Short Term Goal 1 - Progress (Week 1): Met OT Short Term Goal 2 (Week 1): Patient will don LB clothing with mod assist using adaptive equipment OT Short Term Goal 2 - Progress (Week 1): Met OT Short Term Goal 3 (Week 1): Patient will transfer to shower with mod assist OT Short Term Goal 3 - Progress (Week 1): Met OT Short Term Goal 4 (Week 1): Patient will complete toileting with mod assist OT Short Term Goal 4 - Progress (Week 1): Met  Skilled Therapeutic Interventions/Progress Updates:    Pt seen for 1:1 OT session with focus on functional transfers, dynamic standing balance, and discussion with pt and husband in regards to discharge home. Pt received sitting on toilet. Completed toilet task and hygiene at Mod I level then ambulated out of bathroom to complete hand hygiene in standing. Pt directed care to simulate shower transfer at home at pt has 3.5" hop in and out of shower stall. Practiced task 2x at supervision level with rest breaks between each. Husband informing therapist that ramp for entrance of home cannot be installed until Monday. Discussed "bumping" pt in w/c and he was agreeable to this. Husband very knowledgable about this technique and feels confident him and son will be able to complete this. Pt returned to room and left with husband and PA. Therapist notified team members about delayed ramp entrance.   Therapy Documentation Precautions:  Precautions Precautions: Fall Precaution Comments: TDWB, L 25% weight bearing Restrictions Weight Bearing Restrictions: Yes LLE Weight Bearing: Touchdown weight bearing LLE Partial Weight Bearing Percentage or Pounds: 25% General:    Vital Signs:   Pain: Pain Assessment Pain Assessment: No/denies pain  See FIM for current functional status  Therapy/Group: Individual Therapy  Kieara Schwark N Nachmen Mansel 01/03/2014, 3:11 PM

## 2014-01-03 NOTE — Progress Notes (Signed)
Physical Therapy Session Note  Patient Details  Name: Vanessa Page MRN: [id number] Date of Birth: 09-29-1930  Today's Date: 01/03/2014 Time: 1120-1205 Time Calculation (min): 45 min  Short Term Goals: Week 1:  PT Short Term Goal 1 (Week 1): = LTG secondary to short LOS  Skilled Therapeutic Interventions/Progress Updates:    Pt received seated in w/c with husband present; agreeable to therapy. Session focused on completion of hands-on family training, educated pt/husband on use of equipment. Utilized bed rail and elevated HOB during bed mobility, as pt will have hospital bed at discharge. Therapist explained, demonstrated the following: sit>supine with HOB flat, no rail using leg lifter with min A for LLE management; supine>sit with HOB elevated using bed rail, leg lifter for LLE management with mod I; bed<>chair transfer with rolling walker and supervision; gait x25' in controlled environment with rolling walker and supervision.  Husband gave effective return demonstration of supervision/assist and cueing with all described aspects of functional mobility. During rest breaks, therapist explained, demonstrated utilization of hospital bed, w/c breakdown, and management of w/c parts. Husband gave effective return demonstration. Session ended in pt room, where pt was left seated in w/c with husband present and all needs within reach.   Therapy Documentation Precautions:  Precautions Precautions: Fall Precaution Comments: TDWB, L 25% weight bearing Restrictions Weight Bearing Restrictions: Yes LLE Weight Bearing: Touchdown weight bearing LLE Partial Weight Bearing Percentage or Pounds: 25% Pain: Pain Assessment Pain Assessment: No/denies pain Pain Score: 6  Locomotion : Ambulation Ambulation/Gait Assistance: 5: Supervision   See FIM for current functional status  Therapy/Group: Individual Therapy  Benjie Karvonen A Hobble 01/03/2014, 12:12 PM

## 2014-01-04 ENCOUNTER — Inpatient Hospital Stay (HOSPITAL_COMMUNITY): Payer: Medicare Other | Admitting: Physical Therapy

## 2014-01-04 ENCOUNTER — Encounter (HOSPITAL_COMMUNITY): Payer: Medicare Other | Admitting: Occupational Therapy

## 2014-01-04 ENCOUNTER — Inpatient Hospital Stay (HOSPITAL_COMMUNITY): Payer: Medicare Other | Admitting: Occupational Therapy

## 2014-01-04 DIAGNOSIS — S72143A Displaced intertrochanteric fracture of unspecified femur, initial encounter for closed fracture: Secondary | ICD-10-CM

## 2014-01-04 DIAGNOSIS — D72829 Elevated white blood cell count, unspecified: Secondary | ICD-10-CM

## 2014-01-04 DIAGNOSIS — I10 Essential (primary) hypertension: Secondary | ICD-10-CM

## 2014-01-04 LAB — PROTIME-INR
INR: 1.32 (ref 0.00–1.49)
PROTHROMBIN TIME: 16.1 s — AB (ref 11.6–15.2)

## 2014-01-04 MED ORDER — WARFARIN SODIUM 3 MG PO TABS
3.0000 mg | ORAL_TABLET | Freq: Once | ORAL | Status: AC
  Administered 2014-01-04: 3 mg via ORAL
  Filled 2014-01-04: qty 1

## 2014-01-04 NOTE — Progress Notes (Signed)
80 y.o. female with history of HTN, DVT-chronic coumadin, Bronchiectasis; who tripped and fell while playing with her grand kids on 12/23/13. She developed immediate onset of left hip pain due to Left comminuted IT hip fracture. INR at admission 1.82 and coumadin held. On 12/24/13, patient underwent IM nailing of left hip by Dr. Ninfa Linden. CBC today with leucocytosis with WBC-20.6. Has had hypotension as well as poor po intake. Lisinopril held today. Post op TDWB up to 25% for 4 weeks and coumadin resumed   Subjective/Complaints: Reviewed xray Left knee result Review of Systems - Negative except constipation, no BM since admit Objective: Vital Signs: Blood pressure 104/65, pulse 88, temperature 97.7 F (36.5 C), temperature source Oral, resp. rate 18, height 4\' 11"  (1.499 m), weight 57.425 kg (126 lb 9.6 oz), SpO2 96.00%. Dg Knee 1-2 Views Left  01/03/2014   CLINICAL DATA:  left knee pain, hx fall 4/18  EXAM: LEFT KNEE - 1-2 VIEW  COMPARISON:  DG FEMUR*L* dated 12/24/2013; DG HIP COMPLETE*L* dated 12/23/2013  FINDINGS: Patient is status post intra medullary rod and cannulated screw fixation of a comminuted proximal femoral fracture. The distal intra medullary rod and cannulated screw is visualized and appear intact without evidence of loosening or failure. The bones are osteopenic. No acute fracture or dislocation within the visualized osseous structures. Atherosclerotic calcifications are appreciated.  IMPRESSION: No evidence of acute osseous abnormalities. Visualized hardware appears intact.   Electronically Signed   By: Margaree Mackintosh M.D.   On: 01/03/2014 12:21   Results for orders placed during the hospital encounter of 12/26/13 (from the past 72 hour(s))  PROTIME-INR     Status: Abnormal   Collection Time    01/01/14  7:32 AM      Result Value Ref Range   Prothrombin Time 18.3 (*) 11.6 - 15.2 seconds   INR 1.57 (*) 0.00 - 1.49  CBC     Status: Abnormal   Collection Time    01/01/14  7:32 AM       Result Value Ref Range   WBC 14.6 (*) 4.0 - 10.5 K/uL   RBC 4.05  3.87 - 5.11 MIL/uL   Hemoglobin 12.0  12.0 - 15.0 g/dL   HCT 36.2  36.0 - 46.0 %   MCV 89.4  78.0 - 100.0 fL   MCH 29.6  26.0 - 34.0 pg   MCHC 33.1  30.0 - 36.0 g/dL   RDW 14.6  11.5 - 15.5 %   Platelets 353  150 - 400 K/uL  PROTIME-INR     Status: Abnormal   Collection Time    01/02/14  6:41 AM      Result Value Ref Range   Prothrombin Time 20.9 (*) 11.6 - 15.2 seconds   INR 1.86 (*) 0.00 - 1.49  PROTIME-INR     Status: Abnormal   Collection Time    01/03/14  4:50 AM      Result Value Ref Range   Prothrombin Time 18.7 (*) 11.6 - 15.2 seconds   INR 1.61 (*) 0.00 - 1.49     HEENT: normal Cardio: RRR and no murmur Resp: CTA B/L and unlabored GI: BS positive and non distended Extremity:  Pulses positive and No Edema Skin:   Intact and Wound C/D/I and dried blood on middle incision site Neuro: Alert/Oriented and Abnormal Motor 2-/5 Left hip and KE, 4/5 ankle, 5/5 RLE, 5/5 in BUE Musc/Skel:  Normal and Extremity mildly tender Left hip, ecchymosis Left mid thigh incision extending to  popliteal area Tenderness in Left quad tendon, patellar tendon  Gen NAD  Assessment/Plan: 1. Functional deficits secondary to Left comminuted IT hip fx which require 3+ hours per day of interdisciplinary therapy in a comprehensive inpatient rehab setting. Physiatrist is providing close team supervision and 24 hour management of active medical problems listed below. Physiatrist and rehab team continue to assess barriers to discharge/monitor patient progress toward functional and medical goals. Ready for D/C in am FIM: FIM - Bathing Bathing Steps Patient Completed: Chest;Right Arm;Left Arm;Abdomen;Front perineal area;Buttocks;Right upper leg;Left upper leg;Right lower leg (including foot);Left lower leg (including foot) Bathing: 5: Supervision: Safety issues/verbal cues  FIM - Upper Body Dressing/Undressing Upper body  dressing/undressing steps patient completed: Thread/unthread right bra strap;Thread/unthread left bra strap;Hook/unhook bra;Thread/unthread right sleeve of pullover shirt/dresss;Thread/unthread left sleeve of pullover shirt/dress;Put head through opening of pull over shirt/dress;Pull shirt over trunk Upper body dressing/undressing: 7: Complete Independence: No helper FIM - Lower Body Dressing/Undressing Lower body dressing/undressing steps patient completed: Thread/unthread right underwear leg;Thread/unthread left underwear leg;Pull underwear up/down;Thread/unthread right pants leg;Thread/unthread left pants leg;Pull pants up/down;Fasten/unfasten pants;Don/Doff right sock;Don/Doff left sock;Don/Doff right shoe;Fasten/unfasten right shoe;Fasten/unfasten left shoe Lower body dressing/undressing: 4: Min-Patient completed 75 plus % of tasks  FIM - Toileting Toileting steps completed by patient: Adjust clothing prior to toileting;Performs perineal hygiene;Adjust clothing after toileting Toileting Assistive Devices: Grab bar or rail for support Toileting: 4: Steadying assist  FIM - Radio producer Devices: Grab bars;Elevated toilet seat Toilet Transfers: 5-To toilet/BSC: Supervision (verbal cues/safety issues)  FIM - Control and instrumentation engineer Devices: Bed rails;Arm rests Bed/Chair Transfer: 5: Bed > Chair or W/C: Supervision (verbal cues/safety issues)  FIM - Locomotion: Wheelchair Distance: 120 Locomotion: Wheelchair: 1: Total Assistance/staff pushes wheelchair (Pt<25%) FIM - Locomotion: Ambulation Locomotion: Ambulation Assistive Devices: Administrator Ambulation/Gait Assistance: 5: Supervision Locomotion: Ambulation: 0: Activity did not occur  Comprehension Comprehension Mode: Auditory Comprehension: 7-Follows complex conversation/direction: With no assist  Expression Expression Mode: Verbal Expression: 7-Expresses complex ideas: With  no assist  Social Interaction Social Interaction: 7-Interacts appropriately with others - No medications needed.  Problem Solving Problem Solving: 7-Solves complex problems: Recognizes & self-corrects  Memory Memory: 7-Complete Independence: No helper  Medical Problem List and Plan:  Left comminuted intertrochanteric hip fracture status post IM rod,per Ortho staple removal ~14 days post op 1. Protein C deficiency/ H/o DVT /Anticoagulation: Pharmaceutical: Coumadin--INR goal 1.50 - 2.0 due to history of bleeding (eyes)  2. Pain Management: prn oxycodone. Reports some dizziness due to robaxin--changed to cyclobenzaprine, knee pain Left tendinitis 3. Mood: Has positive outlook and motivated to get better. LCSW to follow for evaluation and input.  4. Neuropsych: This patient is capable of making decisions on his own behalf.  5. Hypotension: Hold lisinopril. Monitor orthostatic BP/pulse. Push po fluids.  6. Leucocytosis: Resolving last WBC 14K-- UAneg , repeat CBC 7. ABLA:Hgb normalized 8. Constipation: Increase Senna to bid. Fleets enema today if no results.   LOS (Days) 9 A FACE TO FACE EVALUATION WAS PERFORMED  Charlett Blake 01/04/2014, 7:11 AM

## 2014-01-04 NOTE — Progress Notes (Signed)
Occupational Therapy Session Note  Patient Details  Name: Vanessa Page MRN: [id number] Date of Birth: 06/12/1931  Today's Date: 01/04/2014 Time: 0340-3524 Time Calculation (min): 41 min  Skilled Therapeutic Interventions/Progress Updates:    Patient seen this pm to address her ability to transfer from bed to bedside commode as needed for night time at son's home.  Patient able to maneuver into and out of bed without assistance, and transfer to and from bedside commode without physical assistance.  Did offer verbal cues for best set up of equipment in home.  Practiced walking short distances on carpeted surface, and over threshold onto tile as needed for transition to son's home.    Therapy Documentation Precautions:  Precautions Precautions: Fall Precaution Comments: TDWB, L 25% weight bearing Restrictions Weight Bearing Restrictions: Yes LLE Weight Bearing: Touchdown weight bearing LLE Partial Weight Bearing Percentage or Pounds: 25%    Pain:  7-8/10 left knee   See FIM for current functional status  Therapy/Group: Individual Therapy  Mariah Milling 01/04/2014, 2:45 PM

## 2014-01-04 NOTE — Progress Notes (Signed)
Occupational Therapy Discharge Summary  Patient Details  Name: Vanessa Page MRN: [id number] Date of Birth: [birth year]  Today's Date: 01/04/2014 Time: 0900-1000 Time Calculation (min): 60 min  Skilled Clinical Intervention:  Patient seen this am to ensure her independence with basic self care skills.  Patient's husband has participated in therapy on multiple occasions and is very comfortable with method of transfer, short walking, shower transfer, bathroom equipment, room set up, etc for discharge.  He has played a very active role in her rehab process.  Patient very pleased with progress during her rehab stay.  Even able to don ted hose with sock aide!  Patient has met and even surpassed many of her long term goals.    Pain:  7/10 left knee. Voltarin gel and  Ice applied  Patient has met 8 of 8 long term goals due to improved activity tolerance, improved balance, postural control, ability to compensate for deficits and functional use of  LEFT lower extremity.  Patient to discharge at overall Modified Independent level.  Patient's care partner is independent to provide the necessary physical assistance at discharge.    Reasons goals not met: NA Recommendation:  Patient will benefit from ongoing skilled OT services in home health setting to continue to advance functional skills in the area of BADL and iADL.  Equipment: Bedside commode  Reasons for discharge: treatment goals met and discharge from hospital  Patient/family agrees with progress made and goals achieved: Yes  OT Discharge Precautions/Restrictions  Precautions Precautions: Fall Precaution Comments: TDWB, L 25% weight bearing Restrictions Weight Bearing Restrictions: Yes LLE Weight Bearing: Touchdown weight bearing LLE Partial Weight Bearing Percentage or Pounds: 25%   Vital Signs Therapy Vitals Temp: 97.5 F (36.4 C) Temp src: Oral Pulse Rate: 81 Resp: 18 BP: 127/70 mmHg Patient Position, if appropriate:  Sitting Oxygen Therapy SpO2: 100 % O2 Device: None (Room air) Pain Pain Assessment Pain Assessment: 0-10 Pain Score: 7  Pain Type: Acute pain Pain Location: Knee Pain Orientation: Left Pain Descriptors / Indicators: Aching Pain Onset: With Activity Pain Intervention(s): Medication (See eMAR) Multiple Pain Sites: No   Vision/Perception  Vision- History Baseline Vision/History: No visual deficits Wears Glasses: At all times Patient Visual Report: No change from baseline Vision- Assessment Vision Assessment?: No apparent visual deficits  Cognition Overall Cognitive Status: Within Functional Limits for tasks assessed Arousal/Alertness: Awake/alert Sensation Sensation Light Touch: Appears Intact Stereognosis: Appears Intact Hot/Cold: Appears Intact Proprioception: Appears Intact Coordination Gross Motor Movements are Fluid and Coordinated: Yes Fine Motor Movements are Fluid and Coordinated: Yes Motor  Motor Motor: Within Functional Limits Motor - Discharge Observations: Weakness in left hip and knee    Trunk/Postural Assessment  Cervical Assessment Cervical Assessment: Within Functional Limits Thoracic Assessment Thoracic Assessment: Within Functional Limits Lumbar Assessment Lumbar Assessment: Within Functional Limits Postural Control Postural Control: Within Functional Limits  Balance Balance Balance Assessed: Yes Static Sitting Balance Static Sitting - Balance Support: No upper extremity supported;Feet supported Static Sitting - Level of Assistance: 7: Independent Static Sitting - Comment/# of Minutes: 30 + Dynamic Sitting Balance Dynamic Sitting - Balance Support: No upper extremity supported;Feet supported;During functional activity Dynamic Sitting - Level of Assistance: 7: Independent Dynamic Sitting - Balance Activities: Lateral lean/weight shifting;Reaching for objects;Forward lean/weight shifting;Reaching across midline Static Standing Balance Static  Standing - Balance Support: Right upper extremity supported;Left upper extremity supported;During functional activity Static Standing - Level of Assistance: 6: Modified independent (Device/Increase time) Static Standing - Comment/# of Minutes: 2-3 min Dynamic Standing Balance Dynamic  Standing - Level of Assistance: 6: Modified independent (Device/Increase time) Dynamic Standing - Balance Activities: Forward lean/weight shifting;Reaching for objects Extremity/Trunk Assessment RUE Assessment RUE Assessment: Within Functional Limits LUE Assessment LUE Assessment: Within Functional Limits  See FIM for current functional status  Mariah Milling 01/04/2014, 4:01 PM

## 2014-01-04 NOTE — Progress Notes (Signed)
Physical Therapy Session Note  Patient Details  Name: Vanessa Page MRN: [id number] Date of Birth: November 09, 1930  Today's Date: 01/04/2014 Time: 0800-0900 and 1300-1335 Time Calculation (min): 60 min and 35 min  Short Term Goals: Week 1:  PT Short Term Goal 1 (Week 1): = LTG secondary to short LOS  Skilled Therapeutic Interventions/Progress Updates:   AM Session: Pt received semi reclined in bed, husband present for family training. Family training/patient education for w/c mobility, w/c parts management, and therapist adjusted leg rest length on pt's new chair. W/c mobility 150 ft x 2 with supervision, gait training 30 ft x 2 using RW and husband providing supervision/cues for hand placement with sit<>stand transfer. Pt performed car transfer to Pilot SUV height with supervision/setup and leg lifter for LLE. Remainder of session focused on family training for w/c bumping up/down stairs with husband instructed in and withsafe return demonstration of bumping w/c up/down stairs with 1-person and 2-person assist. Therapist demonstrated appropriate and safe floor transfer in event of fall and discussed when pt should call for EMS vs when it would be safe for family to assist patient after a fall. Answered all patient and spouse questions regarding discharge and problem solving for son's home environment.   PM Session: Pt received sitting in w/c agreeable to therapy. Negotiated up/down 6" curb step x 2 using RW ascending backwards and descending backwards and min guard with min verbal cues for sequencing. Pt required to descend backwards secondary to personal RW setup with decreased space due to fold-up seat anteriorly limiting patient's ability to get close enough to front of RW to safely step down while maintaining precautions. Pt transferred sit<>supine on mat table using leg lifter/supervision and performed/reviewed HEP for proper technique. Pt with c/o 8-9/10 LLE pain during exercises, decreased to  5/10 at end of session. Pt returned to room and left sitting in w/c, all needs within reach.   Therapy Documentation Precautions:  Precautions Precautions: Fall Precaution Comments: TDWB, L 25% weight bearing Restrictions Weight Bearing Restrictions: Yes LLE Weight Bearing: Touchdown weight bearing LLE Partial Weight Bearing Percentage or Pounds: 25% Locomotion : Ambulation Ambulation/Gait Assistance: 5: Supervision Wheelchair Mobility Distance: 150   See FIM for current functional status  Therapy/Group: Individual Therapy  Laretta Alstrom 01/04/2014, 12:04 PM

## 2014-01-04 NOTE — Progress Notes (Signed)
Coumadin per pharmacy  Anticoagulation: 80 y.o F, Warfarin resumed from PTA for hx DVT/protein C deficiency. Goal range 1.5-2 (per MD d/t recurrent ocular bleeding). PTA dose was 2 mg daily EXCEPT for 1 mg on Mon/Fri. INR is subtherapeutic at 1.32 << 1.61< 1.86 <1.57  No bleeding, cbc improved 4/27, ecchymosis L thigh surgical incision s/p ORIF, no bleeding  Goal range 1.5-2 (per MD d/t recurrent ocular bleeding).  Plan Coumadin 3 mg po x 1 qAM INR

## 2014-01-04 NOTE — Progress Notes (Signed)
Recreational Therapy Session Note  Patient Details  Name: Vanessa Page MRN: [id number] Date of Birth: [birth year] Today's Date: 01/04/2014  Order received & chart reviewed.  Pt scheduled for discharge 01/05/14, therefore no formal TR eval/treatment implemented. Waldon Reining 01/04/2014, 10:36 AM

## 2014-01-04 NOTE — Progress Notes (Signed)
Physical Medicine and Rehabilitation Consult  Reason for Consult: left hip fracture  Referring Physician: Dr. Wendee Beavers.  HPI: Vanessa Page is a 80 y.o. female with history of HTN, DVT-chronic coumadin, Bronchiectasis; who tripped and fell while playing with her grand kids on 12/23/13. She developed immediate onset of left hip pain due to Left comminuted IT hip fracture. INR at admission 1.82 and coumadin held. On 12/24/13, patient underwent IM nailing of left hip by Dr. Ninfa Linden. CBC today with leucocytosis with WBC-20.6. Post op PWB and PT evaluations done today. CIR recommended by rehab team.  Systolic blood pressure today 87, poor oral intake of fluids  Patient lives in [place] in a [facility]  Review of Systems  Constitutional: Positive for malaise/fatigue. Negative for fever and chills.  Respiratory: Negative for shortness of breath.  Cardiovascular: Negative for chest pain.  Gastrointestinal: Negative for abdominal pain.  Musculoskeletal: Positive for falls and joint pain. Negative for back pain and neck pain.  Neurological: Negative for speech change.  Psychiatric/Behavioral: Negative for hallucinations and memory loss.   Past Medical History   Diagnosis  Date   .  Hypertension     History reviewed. No pertinent past surgical history.  No family history on file.  Social History: Lives in [place] and currently visiting family. She has no tobacco, alcohol, and drug history on file.  Allergies   Allergen  Reactions   .  Codeine     Medications Prior to Admission   Medication  Sig  Dispense  Refill   .  albuterol (PROVENTIL HFA;VENTOLIN HFA) 108 (90 BASE) MCG/ACT inhaler  Inhale 1 puff into the lungs every 6 (six) hours as needed for wheezing or shortness of breath.     Marland Kitchen  alendronate (FOSAMAX) 70 MG tablet  Take 70 mg by mouth once a week. Take with a full glass of water on an empty stomach.     Marland Kitchen  azelastine (ASTELIN) 137 MCG/SPRAY nasal spray  Place 2 sprays into both nostrils 2  (two) times daily as needed for allergies. Use in each nostril as directed     .  CALCIUM CARBONATE PO  Take 1 tablet by mouth daily.     Marland Kitchen  conjugated estrogens (PREMARIN) vaginal cream  Place 1 Applicatorful vaginally 3 (three) times a week.     .  fluticasone (FLONASE) 50 MCG/ACT nasal spray  Place into both nostrils daily.     Marland Kitchen  lisinopril (PRINIVIL,ZESTRIL) 5 MG tablet  Take 5 mg by mouth 2 (two) times daily.     Marland Kitchen  warfarin (COUMADIN) 2 MG tablet  Take 1-2 mg by mouth daily. Takes 1 mg(1/2 tablet) on Monday and Friday and 2 mg (1 tablet) the rest of the week.      Home:  Home Living  Family/patient expects to be discharged to:: Inpatient rehab  Living Arrangements: Spouse/significant other  Additional Comments: visiting from [place] and fell.  Functional History:  Prior Function  Level of Independence: Independent  Functional Status:  Mobility:  Bed Mobility  Overal bed mobility: Needs Assistance  Bed Mobility: Supine to Sit  Supine to sit: Mod assist;HOB elevated  General bed mobility comments: pt used sheet to slide LLE to edge, cues for technique, extra time for working through PAIN.  Transfers  Overall transfer level: Needs assistance  Equipment used: Rolling walker (2 wheeled)  Transfers: Sit to/from [facility]  Sit to Stand: Mod assist  Stand pivot transfers: Mod assist  General transfer comment: Cues  for PWB, and technique.  Ambulation/Gait  Ambulation/Gait assistance: +2 safety/equipment;Mod assist  Ambulation Distance (Feet): 5 Feet  Assistive device: Rolling walker (2 wheeled)  Gait Pattern/deviations: Step-to pattern  General Gait Details: cues for sequence and PWB.   ADL:   Cognition:  Cognition  Overall Cognitive Status: Within Functional Limits for tasks assessed  Orientation Level: Oriented X4  Cognition  Arousal/Alertness: Awake/alert  Behavior During Therapy: WFL for tasks assessed/performed  Overall Cognitive Status: Within  Functional Limits for tasks assessed  Blood pressure 92/53, pulse 76, temperature 98 F (36.7 C), temperature source Oral, resp. rate 18, height 5' (1.524 m), weight 55.339 kg (122 lb), SpO2 97.00%.  Physical Exam  Nursing note and vitals reviewed.  Constitutional: She is oriented to person, place, and time. She appears well-developed and well-nourished.  HENT:  Head: Normocephalic and atraumatic.  Eyes: Conjunctivae and EOM are normal. Pupils are equal, round, and reactive to light.  Neck: Normal range of motion.  Cardiovascular: Normal rate, regular rhythm and normal heart sounds.  Respiratory: Effort normal and breath sounds normal.  GI: Soft. Bowel sounds are normal. She exhibits no distension. There is no tenderness.  Musculoskeletal: Normal range of motion.  Neurological: She is alert and oriented to person, place, and time. A sensory deficit is present. She exhibits normal muscle tone. Gait abnormal.  Motor strength is 4/5 right deltoid 5/5 left deltoid 5/5 bilateral bicep triceps grip 5/5 right hip flexor knee extensor ankle dorsiflexor plantar flexor 5/5 left ankle dorsiflexor plantar flexor trace at the hip flexor and knee extensor secondary to pain  Psychiatric: She has a normal mood and affect.   Results for orders placed during the hospital encounter of 12/23/13 (from the past 24 hour(s))   CBC Status: Abnormal    Collection Time    12/25/13 4:48 AM   Result  Value  Ref Range    WBC  20.6 (*)  4.0 - 10.5 K/uL    RBC  3.69 (*)  3.87 - 5.11 MIL/uL    Hemoglobin  11.0 (*)  12.0 - 15.0 g/dL    HCT  32.7 (*)  36.0 - 46.0 %    MCV  88.6  78.0 - 100.0 fL    MCH  29.8  26.0 - 34.0 pg    MCHC  33.6  30.0 - 36.0 g/dL    RDW  14.2  11.5 - 15.5 %    Platelets  167  150 - 400 K/uL   BASIC METABOLIC PANEL Status: Abnormal    Collection Time    12/25/13 4:48 AM   Result  Value  Ref Range    Sodium  138  137 - 147 mEq/L    Potassium  4.4  3.7 - 5.3 mEq/L    Chloride  103  96 - 112  mEq/L    CO2  26  19 - 32 mEq/L    Glucose, Bld  168 (*)  70 - 99 mg/dL    BUN  14  6 - 23 mg/dL    Creatinine, Ser  0.62  0.50 - 1.10 mg/dL    Calcium  8.3 (*)  8.4 - 10.5 mg/dL    GFR calc non Af Amer  83 (*)  >90 mL/min    GFR calc Af Amer  >90  >90 mL/min   PROTIME-INR Status: Abnormal    Collection Time    12/25/13 4:48 AM   Result  Value  Ref Range    Prothrombin Time  23.1 (*)  11.6 - 15.2 seconds    INR  2.12 (*)  0.00 - 1.49    Dg Chest 1 View  12/23/2013 CLINICAL DATA: Pre-op respiratory exam for left hip fracture. EXAM: CHEST - 1 VIEW COMPARISON: None. FINDINGS: The heart size and mediastinal contours are within normal limits. No evidence of pulmonary infiltrate or pleural effusion. Thoracic dextroscoliosis noted. IMPRESSION: No acute findings. Electronically Signed By: Earle Gell M.D. On: 12/23/2013 17:06  Dg Hip Complete Left  12/23/2013 CLINICAL DATA: [place], pain EXAM: LEFT HIP - COMPLETE 2+ VIEW COMPARISON: None. FINDINGS: There is a comminuted intertrochanteric fracture of the left hip which extends into the proximal femur. Significant medial angulation. No hip dislocation. No pelvic fracture. Soft tissue swelling. Vascular calcification. IMPRESSION: Comminuted intertrochanteric fracture, left hip. Electronically Signed By: Rolla Flatten M.D. On: 12/23/2013 17:04  Dg Femur Left  12/24/2013 CLINICAL DATA: 80 year old female undergoing treatment of left proximal femur fracture. EXAM: LEFT FEMUR - 2 VIEW; DG C-ARM 1-60 MIN - NRPT [facility] COMPARISON: Preoperative films 12/23/2013. FLUOROSCOPY TIME: 1 min 47 seconds. FINDINGS: Six intraoperative fluoroscopic views of the left femur demonstrating left femur intra medullary rod placement with proximal interlocking dynamic hip screw and distal interlocking cortical screw. Hardware appears intact. Improved alignment of comminuted proximal femur fracture fragments. IMPRESSION: ORIF left femur with no adverse features. Electronically Signed By: Lars Pinks M.D. On: 12/24/2013 13:41  Dg C-arm 1-60 Min-no Report  12/24/2013 CLINICAL DATA: 80 year old female undergoing treatment of left proximal femur fracture. EXAM: LEFT FEMUR - 2 VIEW; DG C-ARM 1-60 MIN - NRPT [facility] COMPARISON: Preoperative films 12/23/2013. FLUOROSCOPY TIME: 1 min 47 seconds. FINDINGS: Six intraoperative fluoroscopic views of the left femur demonstrating left femur intra medullary rod placement with proximal interlocking dynamic hip screw and distal interlocking cortical screw. Hardware appears intact. Improved alignment of comminuted proximal femur fracture fragments. IMPRESSION: ORIF left femur with no adverse features. Electronically Signed By: Lars Pinks M.D. On: 12/24/2013 13:41   Assessment/Plan:  Diagnosis: Left intertrochanteric hip fracture status post IM rod postop day #1  1. Does the need for close, 24 hr/day medical supervision in concert with the patient's rehab needs make it unreasonable for this patient to be served in a less intensive setting? Yes 2. Co-Morbidities requiring supervision/potential complications: Pain control, hypotension 3. Due to bladder management, bowel management, safety, skin/wound care, disease management, medication administration, pain management and patient education, does the patient require 24 hr/day rehab nursing? Yes 4. Does the patient require coordinated care of a physician, rehab nurse, PT (1-2 hrs/day, 5 days/week) and OT (1-2 hrs/day, 5 days/week) to address physical and functional deficits in the context of the above medical diagnosis(es)? Yes Addressing deficits in the following areas: balance, endurance, locomotion, strength, transferring, bathing, dressing, feeding and toileting 5. Can the patient actively participate in an intensive therapy program of at least 3 hrs of therapy per day at least 5 days per week? Yes 6. The potential for patient to make measurable gains while on inpatient rehab is excellent 7. Anticipated functional  outcomes upon discharge from inpatient rehab are modified independent with PT, modified independent with OT, n/a with SLP. 8. Estimated rehab length of stay to reach the above functional goals is: 7-10 days 9. Does the patient have adequate social supports to accommodate these discharge functional goals? Yes 10. Anticipated D/C setting: Home 11. Anticipated post D/C treatments: [place] therapy 12. Overall Rehab/Functional Prognosis: excellent RECOMMENDATIONS:  This patient's condition is appropriate for continued rehabilitative care in the following setting: CIR  Patient has agreed to participate in recommended program. Yes  Note that insurance prior authorization may be required for reimbursement for recommended care.  Comment: Patient has local family but would like to get back to [place] as soon as possible.  12/25/2013  Revision History...      Date/Time User Action    12/25/2013 2:15 PM Charlett Blake, MD Sign    12/25/2013 10:38 AM Bary Leriche, PA-C Share   View Details Report    Routing History.Marland KitchenMarland Kitchen

## 2014-01-05 DIAGNOSIS — S72143A Displaced intertrochanteric fracture of unspecified femur, initial encounter for closed fracture: Secondary | ICD-10-CM

## 2014-01-05 DIAGNOSIS — D72829 Elevated white blood cell count, unspecified: Secondary | ICD-10-CM

## 2014-01-05 DIAGNOSIS — I10 Essential (primary) hypertension: Secondary | ICD-10-CM

## 2014-01-05 LAB — PROTIME-INR
INR: 1.63 — AB (ref 0.00–1.49)
PROTHROMBIN TIME: 18.9 s — AB (ref 11.6–15.2)

## 2014-01-05 MED ORDER — CYCLOBENZAPRINE HCL 5 MG PO TABS: 5.0000 mg | ORAL_TABLET | Freq: Three times a day (TID) | ORAL | Status: DC | PRN

## 2014-01-05 MED ORDER — HYDROCODONE-ACETAMINOPHEN 5-325 MG PO TABS: 1.0000 | ORAL_TABLET | Freq: Four times a day (QID) | ORAL | Status: DC | PRN

## 2014-01-05 MED ORDER — WARFARIN SODIUM 2 MG PO TABS: ORAL_TABLET | ORAL | Status: DC

## 2014-01-05 MED ORDER — DICLOFENAC SODIUM 1 % TD GEL: 2.0000 g | Freq: Four times a day (QID) | TRANSDERMAL | Status: DC

## 2014-01-05 MED ORDER — TRAMADOL HCL 50 MG PO TABS: 50.0000 mg | ORAL_TABLET | Freq: Four times a day (QID) | ORAL | Status: DC | PRN

## 2014-01-05 MED ORDER — SENNOSIDES-DOCUSATE SODIUM 8.6-50 MG PO TABS: 2.0000 | ORAL_TABLET | Freq: Two times a day (BID) | ORAL | Status: DC

## 2014-01-05 NOTE — Plan of Care (Signed)
Problem: RH Stairs Goal: LTG Patient will ambulate up and down stairs w/assist (PT) LTG: Patient will ambulate up and down # of stairs with assistance (PT)  Outcome: Adequate for Discharge Pt's family bumping w/c up/down stairs to enter home.

## 2014-01-05 NOTE — Progress Notes (Signed)
Pt was discharged at 1130a to home with her husband. Algis Liming, PA went over discharge instructions and medications that the patient will go home with. Pt requested to have a copy of her xray from this visit, so those were given to them to take with her. Pt was assessed for any questions or concerns and Algis Liming, PA attended to those.

## 2014-01-05 NOTE — Discharge Instructions (Signed)
Inpatient Rehab Discharge Instructions  Eneida Evers Discharge date and time: 01/05/14   Activities/Precautions/ Functional Status: Activity: activity as tolerated Diet: regular diet Wound Care: Wash with soap and water. Pat dry.   Functional status:  ___ No restrictions     ___ Walk up steps independently ___ 24/7 supervision/assistance   ___ Walk up steps with assistance _X__ Intermittent supervision/assistance  ___ Bathe/dress independently _X__ Walk with walker    _X__ Bathe/dress with assistance ___ Walk Independently    ___ Shower independently ___ Walk with assistance    ___ Shower with assistance _X__ No alcohol     ___ Return to work/school ________  COMMUNITY REFERRALS UPON DISCHARGE:   Home Health:   PT     OT     RN  Agency:  [facility] Phone:  [phone] Medical Equipment/Items Ordered:  18x18 wheelchair with basic cushion; hospital bed; 3-in-1  Agency/Supplier:  [employer]   Phone:  [phone]  Special Instructions: 1. Contact surgeon if you develop fever, chills, redness, swelling or drainage from wound.   My questions have been answered and I understand these instructions. I will adhere to these goals and the provided educational materials after my discharge from the hospital.  Patient/Caregiver Signature _______________________________ Date __________  Clinician Signature _______________________________________ Date __________  Please bring this form and your medication list with you to all your follow-up doctor's appointments.

## 2014-01-05 NOTE — Progress Notes (Signed)
Physical Therapy Discharge Summary  Patient Details  Name: Vanessa Page MRN: [id number] Date of Birth: Jul 30, 1931  Today's Date: 01/05/2014 Time: N/A; no billable services provided  Patient has met 9 of 10 long term goals due to improved activity tolerance, improved balance, increased strength, increased range of motion, decreased pain, ability to compensate for deficits and functional use of  left lower extremity.  Patient to discharge at an ambulatory level Supervision.   Patient's care partner is independent to provide the necessary physical assistance at discharge.  Reasons goals not met: Goal addressing negotiation of 3 steps not met; however, goal status is adequate for discharge, as pt's family will be bumping w/c up/down 3 steps to enter home.  Recommendation:  Patient will benefit from ongoing skilled PT services in home health setting to continue to advance safe functional mobility, address ongoing impairments in stability/independence with functional mobility, and minimize fall risk.  Equipment: w/c and cushion  Reasons for discharge: treatment goals met and discharge from hospital  Patient/family agrees with progress made and goals achieved: Yes  PT Discharge Precautions/Restrictions Precautions Precautions: Fall Restrictions Weight Bearing Restrictions: Yes LLE Weight Bearing: Touchdown weight bearing LLE Partial Weight Bearing Percentage or Pounds: 25% Cognition Orientation Level: Oriented X4 Sensation Sensation Light Touch: Appears Intact Stereognosis: Appears Intact Hot/Cold: Appears Intact Proprioception: Appears Intact Coordination Gross Motor Movements are Fluid and Coordinated: Yes Fine Motor Movements are Fluid and Coordinated: Yes Coordination and Movement Description: Limited by pain and weakness LLE Motor  Motor Motor: Within Functional Limits Motor - Discharge Observations: L hip/knee weakness, pain.  Mobility Bed Mobility Bed Mobility:  Supine to Sit;Sit to Supine Supine to Sit: 6: Modified independent (Device/Increase time);HOB flat Supine to Sit Details (indicate cue type and reason): using leg lifter Sit to Supine: 6: Modified independent (Device/Increase time);HOB flat Sit to Supine - Details (indicate cue type and reason): using leg lifter Transfers Transfers: Yes Sit to Stand: 5: Supervision;With upper extremity assist;With armrests;From chair/3-in-1 Stand Pivot Transfers: 6: Modified independent (Device/Increase time);With armrests Locomotion  Ambulation Ambulation: Yes Ambulation/Gait Assistance: 5: Supervision Ambulation Distance (Feet): 50 Feet Assistive device: Rolling walker Gait Gait: Yes Gait Pattern: Impaired Gait Pattern: Step-to pattern;Decreased step length - left;Decreased stance time - left;Antalgic Stairs / Additional Locomotion Stairs: Yes  Trunk/Postural Assessment  Cervical Assessment Cervical Assessment: Within Functional Limits Thoracic Assessment Thoracic Assessment: Within Functional Limits Lumbar Assessment Lumbar Assessment: Within Functional Limits Postural Control Postural Control: Within Functional Limits  Balance Balance Balance Assessed: Yes Static Sitting Balance Static Sitting - Balance Support: No upper extremity supported;Feet supported Static Sitting - Level of Assistance: 7: Independent Static Sitting - Comment/# of Minutes: >30 min Dynamic Sitting Balance Dynamic Sitting - Balance Support: No upper extremity supported;Feet supported;During functional activity Dynamic Sitting - Level of Assistance: 7: Independent Static Standing Balance Static Standing - Balance Support: Right upper extremity supported;Left upper extremity supported;During functional activity Static Standing - Level of Assistance: 6: Modified independent (Device/Increase time) Static Standing - Comment/# of Minutes: 2-3 min Dynamic Standing Balance Dynamic Standing - Balance Support: During  functional activity;Bilateral upper extremity supported Dynamic Standing - Level of Assistance: 6: Modified independent (Device/Increase time) Dynamic Standing - Balance Activities: Forward lean/weight shifting;Reaching for objects Extremity Assessment  RUE Assessment RUE Assessment: Within Functional Limits LUE Assessment LUE Assessment: Within Functional Limits RLE Assessment RLE Assessment: Within Functional Limits LLE Assessment LLE Assessment: Exceptions to [facility] LLE Strength LLE Overall Strength: Deficits;Due to pain LLE Overall Strength Comments: hip flexion 3-/5, knee flexion/extension 3/5,  ankle DF 4/5  See FIM for current functional status  Benjie Karvonen A Amayia Ciano 01/05/2014, 8:59 PM

## 2014-01-05 NOTE — Progress Notes (Addendum)
80 y.o. female with history of HTN, DVT-chronic coumadin, Bronchiectasis; who tripped and fell while playing with her grand kids on 12/23/13. She developed immediate onset of left hip pain due to Left comminuted IT hip fracture.  Post op TDWB up to 25% for 4 weeks   Subjective/Complaints: Took about 3 vicodins yesterday, took one this am Wants to have ortho remove staples Review of Systems - Negative except constipation, no BM since admit Objective: Vital Signs: Blood pressure 122/70, pulse 74, temperature 97.8 F (36.6 C), temperature source Oral, resp. rate 18, height 4\' 11"  (1.499 m), weight 57.425 kg (126 lb 9.6 oz), SpO2 99.00%. Dg Knee 1-2 Views Left  01/03/2014   CLINICAL DATA:  left knee pain, hx fall 4/18  EXAM: LEFT KNEE - 1-2 VIEW  COMPARISON:  DG FEMUR*L* dated 12/24/2013; DG HIP COMPLETE*L* dated 12/23/2013  FINDINGS: Patient is status post intra medullary rod and cannulated screw fixation of a comminuted proximal femoral fracture. The distal intra medullary rod and cannulated screw is visualized and appear intact without evidence of loosening or failure. The bones are osteopenic. No acute fracture or dislocation within the visualized osseous structures. Atherosclerotic calcifications are appreciated.  IMPRESSION: No evidence of acute osseous abnormalities. Visualized hardware appears intact.   Electronically Signed   By: Margaree Mackintosh M.D.   On: 01/03/2014 12:21   Results for orders placed during the hospital encounter of 12/26/13 (from the past 72 hour(s))  PROTIME-INR     Status: Abnormal   Collection Time    01/03/14  4:50 AM      Result Value Ref Range   Prothrombin Time 18.7 (*) 11.6 - 15.2 seconds   INR 1.61 (*) 0.00 - 1.49  PROTIME-INR     Status: Abnormal   Collection Time    01/04/14 10:30 AM      Result Value Ref Range   Prothrombin Time 16.1 (*) 11.6 - 15.2 seconds   INR 1.32  0.00 - 1.49     HEENT: normal Cardio: RRR and no murmur Resp: CTA B/L and  unlabored GI: BS positive and non distended Extremity:  Pulses positive and No Edema Skin:   Intact and Wound C/D/I Neuro: Alert/Oriented and Abnormal Motor 2-/5 Left hip and KE, 4/5 ankle, 5/5 RLE, 5/5 in BUE Musc/Skel:  Normal and Extremity mildly tender Left hip, ecchymosis Left mid thigh incision extending to popliteal area  Gen NAD  Assessment/Plan: 1. Functional deficits secondary to Left comminuted IT hip fx which require 3+ hours per day of interdisciplinary therapy in a comprehensive inpatient rehab setting. Stable for D/C today F/u PCP when pt returns to [place] F/u ortho next wk for staple removal See D/C summary See D/C instructions FIM: FIM - Bathing Bathing Steps Patient Completed: Chest;Right Arm;Left Arm;Abdomen;Front perineal area;Buttocks;Right upper leg;Left upper leg;Right lower leg (including foot);Left lower leg (including foot) Bathing: 6: Assistive device (Comment)  FIM - Upper Body Dressing/Undressing Upper body dressing/undressing steps patient completed: Thread/unthread right bra strap;Thread/unthread left bra strap;Hook/unhook bra;Thread/unthread right sleeve of pullover shirt/dresss;Thread/unthread left sleeve of pullover shirt/dress;Put head through opening of pull over shirt/dress;Pull shirt over trunk Upper body dressing/undressing: 7: Complete Independence: No helper FIM - Lower Body Dressing/Undressing Lower body dressing/undressing steps patient completed: Thread/unthread right underwear leg;Thread/unthread left underwear leg;Pull underwear up/down;Thread/unthread right pants leg;Thread/unthread left pants leg;Pull pants up/down;Fasten/unfasten pants;Don/Doff right sock;Don/Doff left sock;Don/Doff right shoe;Don/Doff left shoe;Fasten/unfasten right shoe;Fasten/unfasten left shoe Lower body dressing/undressing: 6: Assistive device (Comment)  FIM - Toileting Toileting steps completed by patient: Adjust  clothing prior to toileting;Adjust clothing after  toileting;Performs perineal hygiene Toileting Assistive Devices: Grab bar or rail for support Toileting: 6: Assistive device: No helper  FIM - Radio producer Devices: Nurse, learning disability Transfers: 6-Assistive device: No helper;6-To toilet/ BSC;6-From toilet/BSC  FIM - Control and instrumentation engineer Devices: Walker (leg lifter) Bed/Chair Transfer: 6: Assistive device: no helper;6: Supine > Sit: No assist;6: Sit > Supine: No assist;6: Bed > Chair or W/C: No assist;6: Chair or W/C > Bed: No assist  FIM - Locomotion: Wheelchair Distance: 150 Locomotion: Wheelchair: 5: Travels 150 ft or more: maneuvers on rugs and over door sills with supervision, cueing or coaxing FIM - Locomotion: Ambulation Locomotion: Ambulation Assistive Devices: Administrator Ambulation/Gait Assistance: 5: Supervision Locomotion: Ambulation: 1: Travels less than 50 ft with supervision/safety issues  Comprehension Comprehension Mode: Auditory Comprehension: 7-Follows complex conversation/direction: With no assist  Expression Expression Mode: Verbal Expression: 7-Expresses complex ideas: With no assist  Social Interaction Social Interaction: 7-Interacts appropriately with others - No medications needed.  Problem Solving Problem Solving: 7-Solves complex problems: Recognizes & self-corrects  Memory Memory: 7-Complete Independence: No helper  Medical Problem List and Plan:  Left comminuted intertrochanteric hip fracture status post IM rod,per Ortho staple removal ~14 days post op 1. Protein C deficiency/ H/o DVT /Anticoagulation: Pharmaceutical: Coumadin--INR goal 1.50 - 2.0 due to history of bleeding (eyes)  2. Pain Management: prn oxycodone. Reports some dizziness due to robaxin--changed to cyclobenzaprine, knee pain Left tendinitis 3. Mood: Has positive outlook and motivated to get better. LCSW to follow for evaluation and input.  4. Neuropsych:  This patient is capable of making decisions on his own behalf.  5. Hypotension: Hold lisinopril. Monitor orthostatic BP/pulse. Push po fluids.     LOS (Days) [place] EVALUATION WAS PERFORMED  Charlett Blake 01/05/2014, 7:05 AM

## 2014-01-05 NOTE — Discharge Summary (Signed)
Physician Discharge Summary  Patient ID: Vanessa Page MRN: [id number] DOB/AGE: 78-Apr-1932 78 y.o.  Admit date: 12/26/2013 Discharge date: 01/05/2014  Discharge Diagnoses:  Principal Problem:   Hip fracture requiring operative repair Active Problems:   History of DVT of lower extremity   Hypotension, unspecified   Knee pain, left   Discharged Condition: Improved.   Significant Diagnostic Studies:  Dg Femur Left  12/24/2013   CLINICAL DATA:  80 year old female undergoing treatment of left proximal femur fracture.  EXAM: LEFT FEMUR - 2 VIEW; DG C-ARM 1-60 MIN - NRPT [facility]  COMPARISON:  Preoperative films 12/23/2013.  FLUOROSCOPY TIME:  1 min 47 seconds.  FINDINGS: Six intraoperative fluoroscopic views of the left femur demonstrating left femur intra medullary rod placement with proximal interlocking dynamic hip screw and distal interlocking cortical screw. Hardware appears intact. Improved alignment of comminuted proximal femur fracture fragments.  IMPRESSION: ORIF left femur with no adverse features.   Electronically Signed   By: Lars Pinks M.D.   On: 12/24/2013 13:41   Dg Knee 1-2 Views Left  01/03/2014   CLINICAL DATA:  left knee pain, hx fall 4/18  EXAM: LEFT KNEE - 1-2 VIEW  COMPARISON:  DG FEMUR*L* dated 12/24/2013; DG HIP COMPLETE*L* dated 12/23/2013  FINDINGS: Patient is status post intra medullary rod and cannulated screw fixation of a comminuted proximal femoral fracture. The distal intra medullary rod and cannulated screw is visualized and appear intact without evidence of loosening or failure. The bones are osteopenic. No acute fracture or dislocation within the visualized osseous structures. Atherosclerotic calcifications are appreciated.  IMPRESSION: No evidence of acute osseous abnormalities. Visualized hardware appears intact.   Electronically Signed   By: Margaree Mackintosh M.D.   On: 01/03/2014 12:21    Labs:  Basic Metabolic Panel:    Component Value Date/Time   NA 138  12/27/2013 0500   K 3.9 12/27/2013 0500   CL 99 12/27/2013 0500   CO2 27 12/27/2013 0500   GLUCOSE 107* 12/27/2013 0500   BUN 15 12/27/2013 0500   CREATININE 0.59 12/27/2013 0500   CALCIUM 8.5 12/27/2013 0500   GFRNONAA 84* 12/27/2013 0500   GFRAA >90 12/27/2013 0500      CBC:    Component Value Date/Time   WBC 14.6* 01/01/2014 0732   RBC 4.05 01/01/2014 0732   HGB 12.0 01/01/2014 0732   HCT 36.2 01/01/2014 0732   PLT 353 01/01/2014 0732   MCV 89.4 01/01/2014 0732   MCH 29.6 01/01/2014 0732   MCHC 33.1 01/01/2014 0732   RDW 14.6 01/01/2014 0732   LYMPHSABS 2.8 12/27/2013 0500   MONOABS 1.6* 12/27/2013 0500   EOSABS 0.2 12/27/2013 0500   BASOSABS 0.0 12/27/2013 0500    Filed Vitals:   01/05/14 0643  BP: 122/70  Pulse: 74  Temp: 97.8 F (36.6 C)  Resp: 18    CBG: No results found for this basename: GLUCAP,  in the last 168 hours  Brief HPI:   Vanessa Page is a 80 y.o. female with history of HTN, DVT-chronic coumadin, Bronchiectasis; who tripped and fell while playing with her grand kids on 12/23/13. She developed immediate onset of left hip pain due to Left comminuted IT hip fracture. INR at admission 1.82 and coumadin held. On 12/24/13, patient underwent IM nailing of left hip by Dr. Ninfa Linden. CBC today with leucocytosis with WBC-20.6. Has had hypotension as well as poor po intake. Lisinopril held today. Post op TDWB up to 25% for 4 weeks and coumadin resumed. Post  op therapy evaluations done and CIR recommended for follow up.    Hospital Course: Vanessa Page was admitted to rehab 12/26/2013 for inpatient therapies to consist of PT, ST and OT at least three hours five days a week. Past admission physiatrist, therapy team and rehab RN have worked together to provide customized collaborative inpatient rehab. Blood pressures were monitored on bid basis and have been well controlled of medications. Lisinopril was discontinued and patient to follow up with PMD prior to resuming medication.   She was started on laxative to help with narcotic induced constipation.  UA/UCS was ordered due to leucocytosis and was negative. Her hip wounds have healed well without signs or symptoms of infections. Staples remain in place and she is to follow up with Dr. Berenice Primas next week for staple removal. ABLA is stable. She has had complaints of left knee pain with increase in activity and xrays done revealed intact hardware and no acute fracture or dislocation.  Voltaren gel has been effective in pain  Management.  Pharmacy has been assisting with coumadin dosing with INR goal 1.5- 2.0 range. INR is therapeutic at discharge and [facility] to monitor and adjust dosing past discharge. Patient has made good progress and has met and surpassed some goals. She is able to maintain WB precautions without difficulty and is independent at wheelchair level. [facility] to continue to provide HHPT, HHOT for further therapies as well as HHRN for protime draws.    Rehab course: During patient's stay in rehab weekly team conferences were held to monitor patient's progress, set goals and discuss barriers to discharge. Patient has had improvement in activity tolerance, balance, postural control, as well as ability to compensate for deficits. She is able to complete bathing and dressing tasks independently. She has had improvement in functional use LLE. She is independent for bed mobility and transfers. She requires supervision to ambulate 50 feet with RW. Husband had been present for multiple therapy sessions and has been educated on assistance needed with all aspects of mobility.     Disposition: 01-Home or Self Care  Diet: Regular.   Special Instructions: 1. Contact surgeon if you develop fever, chills, redness, swelling or drainage from wound.     Medication List    STOP taking these medications       conjugated estrogens vaginal cream  Commonly known as:  PREMARIN     oxyCODONE-acetaminophen 5-325 MG  per tablet  Commonly known as:  ROXICET      TAKE these medications       albuterol 108 (90 BASE) MCG/ACT inhaler  Commonly known as:  PROVENTIL HFA;VENTOLIN HFA  Inhale 1 puff into the lungs every 6 (six) hours as needed for wheezing or shortness of breath.     alendronate 70 MG tablet  Commonly known as:  FOSAMAX  Take 70 mg by mouth once a week. Take with a full glass of water on an empty stomach.     azelastine 0.1 % nasal spray  Commonly known as:  ASTELIN  Place 2 sprays into both nostrils 2 (two) times daily as needed for allergies. Use in each nostril as directed     CALCIUM CARBONATE PO  Take 1 tablet by mouth daily.     cyclobenzaprine 5 MG tablet--Rx #30 pills  Commonly known as:  FLEXERIL  Take 1 tablet (5 mg total) by mouth 3 (three) times daily as needed for muscle spasms.     diclofenac sodium 1 % Gel  Commonly known as:  VOLTAREN  Apply 2 g topically 4 (four) times daily. Apply to left knee     fluticasone 50 MCG/ACT nasal spray  Commonly known as:  FLONASE  Place into both nostrils daily.     HYDROcodone-acetaminophen 5-325 MG per tablet Rx #45 pills  Commonly known as:  NORCO/VICODIN  Take 1 tablet by mouth every 6 (six) hours as needed for severe pain.     senna-docusate 8.6-50 MG per tablet  Commonly known as:  Senokot-S  Take 2 tablets by mouth 2 (two) times daily.     traMADol 50 MG tablet--Rx #30 pills  Commonly known as:  ULTRAM  Take 1 tablet (50 mg total) by mouth every 6 (six) hours as needed for moderate pain.     warfarin 2 MG tablet  Commonly known as:  COUMADIN  Take 1-2 mg by mouth daily. Takes 1 mg(1/2 tablet) on Friday and 2 mg (1 tablet) the rest of the week.       Follow-up Information   Follow up with Mcarthur Rossetti, MD On 01/08/2014. (@ 10:30 AM with Benita Stabile, PA)    Specialty:  Orthopedic Surgery   Contact information:   [place] [address] [phone]       Call Charlett Blake, MD.  (As needed)    Specialty:  Physical Medicine and Rehabilitation   Contact information:   [place] [address] [phone]       Signed: Bary Leriche 01/08/2014, 12:13 PM

## 2014-01-08 DIAGNOSIS — I959 Hypotension, unspecified: Secondary | ICD-10-CM | POA: Diagnosis present

## 2014-01-08 DIAGNOSIS — M25562 Pain in left knee: Secondary | ICD-10-CM | POA: Diagnosis not present

## 2014-01-08 NOTE — Progress Notes (Signed)
Social Work Discharge Note  The overall goal for the admission was met for:   Discharge location: Yes - home with husband to son's home initially, then their home in [place]  Length of Stay: Yes - 10 days  Discharge activity level: Yes - modified independent  Home/community participation: Yes  Services provided included: MD, RD, PT, OT, RN, TR, Pharmacy and SW  Financial Services: Medicare and Private Insurance: [employer]  Follow-up services arranged: Home Health: PT, OT, RN, DME: hospital bed, 18x18 wheelchair, 3-in-1 commode and Patient/Family has no preference for HH/DME agencies  Comments (or additional information):  Patient/Family verbalized understanding of follow-up arrangements: Yes  Individual responsible for coordination of the follow-up plan: pt with assistance from her husband.  Pt will also have outpatient therapies when she returns to [place] at her [facility].  Order faxed to [facility]ile [facility]) to Ernestina Columbia, SW.  Confirmed correct DME delivered: Silvestre Mesi Olena Willy 01/08/2014    Silvestre Mesi Kenzlie Disch

## 2014-01-13 ENCOUNTER — Emergency Department (HOSPITAL_COMMUNITY): Payer: Medicare Other

## 2014-01-13 ENCOUNTER — Emergency Department (HOSPITAL_COMMUNITY)
Admission: EM | Admit: 2014-01-13 | Discharge: 2014-01-13 | Disposition: A | Discharge status: Home / Self Care | Payer: Medicare Other | Attending: Emergency Medicine | Admitting: Emergency Medicine

## 2014-01-13 ENCOUNTER — Encounter (HOSPITAL_COMMUNITY): Payer: Self-pay | Admitting: Emergency Medicine

## 2014-01-13 DIAGNOSIS — Z9889 Other specified postprocedural states: Secondary | ICD-10-CM | POA: Insufficient documentation

## 2014-01-13 DIAGNOSIS — I10 Essential (primary) hypertension: Secondary | ICD-10-CM | POA: Insufficient documentation

## 2014-01-13 DIAGNOSIS — Z8709 Personal history of other diseases of the respiratory system: Secondary | ICD-10-CM | POA: Insufficient documentation

## 2014-01-13 DIAGNOSIS — Z7901 Long term (current) use of anticoagulants: Secondary | ICD-10-CM | POA: Insufficient documentation

## 2014-01-13 DIAGNOSIS — IMO0002 Reserved for concepts with insufficient information to code with codable children: Secondary | ICD-10-CM | POA: Insufficient documentation

## 2014-01-13 DIAGNOSIS — Z862 Personal history of diseases of the blood and blood-forming organs and certain disorders involving the immune mechanism: Secondary | ICD-10-CM | POA: Insufficient documentation

## 2014-01-13 DIAGNOSIS — K5900 Constipation, unspecified: Secondary | ICD-10-CM

## 2014-01-13 DIAGNOSIS — Z792 Long term (current) use of antibiotics: Secondary | ICD-10-CM | POA: Insufficient documentation

## 2014-01-13 DIAGNOSIS — Z8781 Personal history of (healed) traumatic fracture: Secondary | ICD-10-CM | POA: Insufficient documentation

## 2014-01-13 DIAGNOSIS — Z86718 Personal history of other venous thrombosis and embolism: Secondary | ICD-10-CM | POA: Insufficient documentation

## 2014-01-13 DIAGNOSIS — Z79899 Other long term (current) drug therapy: Secondary | ICD-10-CM | POA: Insufficient documentation

## 2014-01-13 DIAGNOSIS — Z8739 Personal history of other diseases of the musculoskeletal system and connective tissue: Secondary | ICD-10-CM | POA: Insufficient documentation

## 2014-01-13 MED ORDER — POLYETHYLENE GLYCOL 3350 17 G PO PACK: 17.0000 g | PACK | Freq: Every day | ORAL | Status: DC

## 2014-01-13 NOTE — ED Notes (Signed)
Patient had a large BM with multiple consistency  From hard black to soft brown.  Patient states that  She feels better and denies any pain.

## 2014-01-13 NOTE — ED Notes (Signed)
Bed: YN82 Expected date:  Expected time:  Means of arrival:  Comments: EMS Constipation

## 2014-01-13 NOTE — ED Notes (Signed)
Per EMS, patient broke left femur 3 weeks ago, has been on tramadol and Vicodin since then. Patient reports constipation, with BM every 2-3 days. Patient states she had a BM @1  day ago, that it was "compact and difficult to pass". Patient reports feeling the same tonight, only worse. Patient c/o pain at rectum.

## 2014-01-13 NOTE — ED Provider Notes (Signed)
CSN: [id number]     Arrival date & time 01/13/14  0600 History   First MD Initiated Contact with Patient 01/13/14 [phone]     Chief Complaint  Patient presents with  . Constipation     (Consider location/radiation/quality/duration/timing/severity/associated sxs/prior Treatment) Patient is a 80 y.o. female presenting with constipation. The history is provided by the patient and the spouse.  Constipation Severity:  Moderate Time since last bowel movement: had one just now. Timing:  Intermittent Progression:  Resolved Chronicity:  New Context: narcotics   Stool description:  Hard and small Unusual stool frequency:  Every other day Relieved by:  Defecation Worsened by:  Nothing tried Ineffective treatments: senokot. Associated symptoms: abdominal pain   Associated symptoms: no back pain, no diarrhea, no dysuria, no fever, no nausea and no vomiting   Abdominal pain:    Pain location: lower abdomen.   Quality:  Cramping   Severity:  Moderate   Onset quality:  Sudden   Duration: minutes.   Timing:  Intermittent   Progression:  Resolved   Chronicity:  New Risk factors: change in medication and recent surgery     Past Medical History  Diagnosis Date  . Hypertension   . 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 DVT (deep venous thrombosis)   . Bursitis of right shoulder   . Bronchiectasis    Past Surgical History  Procedure Laterality Date  . Intramedullary (im) nail intertrochanteric Left 12/24/2013    Procedure: INTRAMEDULLARY (IM) NAIL INTERTROCHANTRIC;  Surgeon: Mcarthur Rossetti, MD;  Location: [facility] ORS;  Service: Orthopedics;  Laterality: Left;   History reviewed. No pertinent family history. History  Substance Use Topics  . Smoking status: Never Smoker   . Smokeless tobacco: Not on file  . Alcohol Use: Yes     Comment: occasional wine    OB History   Grav Para Term Preterm Abortions TAB SAB Ect Mult Living                  Review of Systems  Constitutional: Negative for fever and fatigue.  HENT: Negative for congestion and drooling.   Eyes: Negative for pain.  Respiratory: Negative for cough and shortness of breath.   Cardiovascular: Negative for chest pain.  Gastrointestinal: Positive for abdominal pain and constipation. Negative for nausea, vomiting and diarrhea.  Genitourinary: Negative for dysuria and hematuria.  Musculoskeletal: Negative for back pain, gait problem and neck pain.  Skin: Negative for color change.  Neurological: Negative for dizziness and headaches.  Hematological: Negative for adenopathy.  Psychiatric/Behavioral: Negative for behavioral problems.  All other systems reviewed and are negative.     Allergies  Codeine and Other  Home Medications   Prior to Admission medications   Medication Sig Start Date End Date Taking? Authorizing Provider  alendronate (FOSAMAX) 70 MG tablet Take 70 mg by mouth once a week. Take with a full glass of water on an empty stomach.   Yes Historical Provider, MD  benzonatate (TESSALON) 100 MG capsule Take 100 mg by mouth 3 (three) times daily as needed for cough.   Yes Historical Provider, MD  CALCIUM CARBONATE PO Take 1 tablet by mouth daily.   Yes Historical Provider, MD  cefdinir (OMNICEF) 300 MG capsule Take 300 mg by mouth 2 (two) times daily.   Yes Historical Provider, MD  cyclobenzaprine (FLEXERIL) 5 MG tablet Take 1 tablet (5 mg total) by mouth 3 (three) times daily as needed for muscle spasms.  01/05/14  Yes Ivan Anchors Love, PA-C  fluticasone (FLONASE) 50 MCG/ACT nasal spray Place into both nostrils daily.   Yes Historical Provider, MD  guaiFENesin (MUCINEX) 600 MG 12 hr tablet Take 600 mg by mouth 2 (two) times daily as needed for cough.   Yes Historical Provider, MD  HYDROcodone-acetaminophen (NORCO/VICODIN) 5-325 MG per tablet Take 1 tablet by mouth every 6 (six) hours as needed for severe pain. 01/05/14  Yes Ivan Anchors Love, PA-C  lisinopril  (PRINIVIL,ZESTRIL) 5 MG tablet Take 5 mg by mouth 2 (two) times daily.   Yes Historical Provider, MD  senna-docusate (SENOKOT-S) 8.6-50 MG per tablet Take 2 tablets by mouth 2 (two) times daily. 01/05/14  Yes Ivan Anchors Love, PA-C  traMADol (ULTRAM) 50 MG tablet Take 1 tablet (50 mg total) by mouth every 6 (six) hours as needed for moderate pain. 01/05/14  Yes Ivan Anchors Love, PA-C  valACYclovir (VALTREX) 500 MG tablet Take 500 mg by mouth 2 (two) times daily as needed. For fever blisters   Yes Historical Provider, MD  warfarin (COUMADIN) 2 MG tablet Take 1-2 mg by mouth as directed. Take 1 tablet (2mg ) by mouth every day of the week except Friday take 0.5 tablet (1mg )   Yes Historical Provider, MD  albuterol (PROVENTIL HFA;VENTOLIN HFA) 108 (90 BASE) MCG/ACT inhaler Inhale 1 puff into the lungs every 6 (six) hours as needed for wheezing or shortness of breath.    Historical Provider, MD  azelastine (ASTELIN) 137 MCG/SPRAY nasal spray Place 2 sprays into both nostrils 2 (two) times daily as needed for allergies. Use in each nostril as directed    Historical Provider, MD  diclofenac sodium (VOLTAREN) 1 % GEL Apply 2 g topically 4 (four) times daily. Apply to left knee 01/05/14   Ivan Anchors Love, PA-C   BP 125/59  Pulse 75  Temp(Src) 97.9 F (36.6 C) (Oral)  Resp 20  SpO2 99% Physical Exam  Nursing note and vitals reviewed. Constitutional: She is oriented to person, place, and time. She appears well-developed and well-nourished.  HENT:  Head: Normocephalic and atraumatic.  Mouth/Throat: Oropharynx is clear and moist. No oropharyngeal exudate.  Eyes: Conjunctivae and EOM are normal. Pupils are equal, round, and reactive to light.  Neck: Normal range of motion. Neck supple.  Cardiovascular: Normal rate, regular rhythm, normal heart sounds and intact distal pulses.  Exam reveals no gallop and no friction rub.   No murmur heard. Pulmonary/Chest: Effort normal and breath sounds normal. No respiratory distress.  She has no wheezes.  Abdominal: Soft. Bowel sounds are normal. There is no tenderness. There is no rebound and no guarding.  Musculoskeletal: Normal range of motion. She exhibits no edema and no tenderness.  Neurological: She is alert and oriented to person, place, and time.  Skin: Skin is warm and dry.  Psychiatric: She has a normal mood and affect. Her behavior is normal.    ED Course  Procedures (including critical care time) Labs Review Labs Reviewed - No data to display  Imaging Review Dg Chest 1 View  01/13/2014   CLINICAL DATA:  Constipation for 2 days, no abdominal pain  EXAM: CHEST - 1 VIEW  COMPARISON:  DG CHEST 1 VIEW dated 12/23/2013  FINDINGS: Stable scoliosis. Heart size mildly enlarged. Vascular pattern normal. Hyperinflation suggests COPD. Mild diffuse interstitial change with a chronic appearance. Bilateral nipple shadows incidentally noted.  IMPRESSION: No acute abnormalities   Electronically Signed   By: Skipper Cliche M.D.   On: 01/13/2014  08:34   Dg Abd 1 View  01/13/2014   CLINICAL DATA:  constipation  EXAM: ABDOMEN - 1 VIEW  COMPARISON:  None.  FINDINGS: Small bowel decompressed. Moderate colonic fecal material, nondilated. Patchy aortic calcifications. Mild lumbar levoscoliosis. Fixation hardware in the left femur partially visualized.  IMPRESSION: Nonobstructive bowel gas pattern with moderate colonic fecal material.   Electronically Signed   By: Arne Cleveland M.D.   On: 01/13/2014 08:31     EKG Interpretation None      MDM   Final diagnoses:  Constipation    7:10 AM 80 y.o. female w hx of HTN,, factor def, DVT, s/p recent left hip fx on coumadin who presents with constipation. She had a left hip fracture repair approximately 3 weeks ago and has been on Senokot once daily since that time. She has had hard stools but has had bowel movements approximately every other day. She denies seeing any blood in her stool. She had some abdominal cramping at midnight last  night and again at 5 AM this morning. She has had a small bowel movement here and is feeling much better on exam. She currently denies any pain. She denies any fevers, vomiting, headache, or other associated symptoms. She has been on narcotic medications since her hip fracture. She is afebrile and vital signs are unremarkable here. Will get screening plain film and plan on adding MiraLAX to her bowel regimen. Do not think INR needs to be checked - pt denies blood in stool, HA, etc.   9:15 AM: Imaging consistent with constipation. The patient's abdomen remained soft and benign. She remains asymptomatic. Will have her take Senokot twice a day and add MiraLAX daily to her regimen.  I have discussed the diagnosis/risks/treatment options with the patient and family and believe the pt to be eligible for discharge home to follow-up with her pcp next week. We also discussed returning to the ED immediately if new or worsening sx occur. We discussed the sx which are most concerning (e.g.,return of abd pain, new sx such as fever, vomiting, bloody stools) that necessitate immediate return. Medications administered to the patient during their visit and any new prescriptions provided to the patient are listed below.  Medications given during this visit Medications - No data to display  New Prescriptions   POLYETHYLENE GLYCOL (MIRALAX / GLYCOLAX) PACKET    Take 17 g by mouth daily.     Blanchard Kelch, MD 01/13/14 [phone]

## 2014-01-17 ENCOUNTER — Telehealth: Payer: Self-pay

## 2014-01-17 NOTE — Telephone Encounter (Signed)
Patient husband called requesting tramadol 50mg  refill.

## 2014-01-18 ENCOUNTER — Telehealth: Payer: Self-pay

## 2014-01-18 NOTE — Telephone Encounter (Signed)
Dr Zollie Beckers to address

## 2014-01-18 NOTE — Telephone Encounter (Signed)
Attempted to contact patient. Left a voicemail informing patient to call Dr. Ninfa Linden for a refill on Tramadol per Dr. Letta Pate

## 2014-01-18 NOTE — Telephone Encounter (Signed)
Patient's husband called requesting a refill on Tramadol. Please advise.

## 2014-01-18 NOTE — Telephone Encounter (Signed)
Dr Zollie Beckers, Ortho to address

## 2016-11-01 ENCOUNTER — Inpatient Hospital Stay (HOSPITAL_BASED_OUTPATIENT_CLINIC_OR_DEPARTMENT_OTHER)
Admission: EM | Admit: 2016-11-01 | Discharge: 2016-11-03 | DRG: 690 | Disposition: A | Discharge status: Home / Self Care | Payer: Medicare Other | Attending: Internal Medicine | Admitting: Internal Medicine

## 2016-11-01 ENCOUNTER — Encounter (HOSPITAL_BASED_OUTPATIENT_CLINIC_OR_DEPARTMENT_OTHER): Payer: Self-pay | Admitting: Internal Medicine

## 2016-11-01 ENCOUNTER — Observation Stay (HOSPITAL_COMMUNITY): Payer: Medicare Other

## 2016-11-01 ENCOUNTER — Emergency Department (HOSPITAL_BASED_OUTPATIENT_CLINIC_OR_DEPARTMENT_OTHER): Payer: Medicare Other

## 2016-11-01 DIAGNOSIS — I872 Venous insufficiency (chronic) (peripheral): Secondary | ICD-10-CM | POA: Diagnosis present

## 2016-11-01 DIAGNOSIS — Z86718 Personal history of other venous thrombosis and embolism: Secondary | ICD-10-CM

## 2016-11-01 DIAGNOSIS — I080 Rheumatic disorders of both mitral and aortic valves: Secondary | ICD-10-CM | POA: Diagnosis present

## 2016-11-01 DIAGNOSIS — I83009 Varicose veins of unspecified lower extremity with ulcer of unspecified site: Secondary | ICD-10-CM | POA: Diagnosis present

## 2016-11-01 DIAGNOSIS — R7881 Bacteremia: Secondary | ICD-10-CM

## 2016-11-01 DIAGNOSIS — T8069XA Other serum reaction due to other serum, initial encounter: Secondary | ICD-10-CM | POA: Diagnosis present

## 2016-11-01 DIAGNOSIS — Z809 Family history of malignant neoplasm, unspecified: Secondary | ICD-10-CM | POA: Diagnosis not present

## 2016-11-01 DIAGNOSIS — Z7901 Long term (current) use of anticoagulants: Secondary | ICD-10-CM

## 2016-11-01 DIAGNOSIS — N390 Urinary tract infection, site not specified: Secondary | ICD-10-CM | POA: Diagnosis not present

## 2016-11-01 DIAGNOSIS — L97909 Non-pressure chronic ulcer of unspecified part of unspecified lower leg with unspecified severity: Secondary | ICD-10-CM | POA: Diagnosis present

## 2016-11-01 DIAGNOSIS — R531 Weakness: Secondary | ICD-10-CM

## 2016-11-01 DIAGNOSIS — E871 Hypo-osmolality and hyponatremia: Secondary | ICD-10-CM

## 2016-11-01 DIAGNOSIS — E890 Postprocedural hypothyroidism: Secondary | ICD-10-CM | POA: Diagnosis present

## 2016-11-01 DIAGNOSIS — Z9889 Other specified postprocedural states: Secondary | ICD-10-CM | POA: Diagnosis not present

## 2016-11-01 DIAGNOSIS — Z79899 Other long term (current) drug therapy: Secondary | ICD-10-CM | POA: Diagnosis not present

## 2016-11-01 DIAGNOSIS — M791 Myalgia, unspecified site: Secondary | ICD-10-CM | POA: Diagnosis present

## 2016-11-01 DIAGNOSIS — R32 Unspecified urinary incontinence: Secondary | ICD-10-CM | POA: Diagnosis present

## 2016-11-01 DIAGNOSIS — N3000 Acute cystitis without hematuria: Secondary | ICD-10-CM | POA: Diagnosis not present

## 2016-11-01 DIAGNOSIS — D72829 Elevated white blood cell count, unspecified: Secondary | ICD-10-CM

## 2016-11-01 DIAGNOSIS — Z886 Allergy status to analgesic agent status: Secondary | ICD-10-CM | POA: Diagnosis not present

## 2016-11-01 HISTORY — DX: Nonrheumatic mitral (valve) insufficiency: I34.0

## 2016-11-01 HISTORY — DX: Nonrheumatic aortic (valve) insufficiency: I35.1

## 2016-11-01 LAB — COMPREHENSIVE METABOLIC PANEL
ALT: 43 U/L (ref 14–54)
ANION GAP: 9 (ref 5–15)
AST: 53 U/L — AB (ref 15–41)
Albumin: 2.5 g/dL — ABNORMAL LOW (ref 3.5–5.0)
Alkaline Phosphatase: 96 U/L (ref 38–126)
BUN: 12 mg/dL (ref 6–20)
CHLORIDE: 98 mmol/L — AB (ref 101–111)
CO2: 25 mmol/L (ref 22–32)
Calcium: 8.1 mg/dL — ABNORMAL LOW (ref 8.9–10.3)
Creatinine, Ser: 0.7 mg/dL (ref 0.44–1.00)
GFR calc Af Amer: >60 mL/min (ref >60)
Glucose, Bld: 118 mg/dL — ABNORMAL HIGH (ref 65–99)
POTASSIUM: 3.8 mmol/L (ref 3.5–5.1)
Sodium: 132 mmol/L — ABNORMAL LOW (ref 135–145)
Total Bilirubin: 0.9 mg/dL (ref 0.3–1.2)
Total Protein: 6.2 g/dL — ABNORMAL LOW (ref 6.5–8.1)

## 2016-11-01 LAB — URINALYSIS, MICROSCOPIC (REFLEX)

## 2016-11-01 LAB — URINALYSIS, ROUTINE W REFLEX MICROSCOPIC
Glucose, UA: NEGATIVE mg/dL
Ketones, ur: 15 mg/dL — AB
NITRITE: NEGATIVE
PROTEIN: NEGATIVE mg/dL
Specific Gravity, Urine: 1.022 (ref 1.005–1.030)
pH: 6 (ref 5.0–8.0)

## 2016-11-01 LAB — CBC WITH DIFFERENTIAL/PLATELET
Basophils Absolute: 0.1 10*3/uL (ref 0.0–0.1)
Basophils Relative: 0 %
Eosinophils Absolute: 0.3 10*3/uL (ref 0.0–0.7)
Eosinophils Relative: 1 %
HCT: 40.1 % (ref 36.0–46.0)
HEMOGLOBIN: 13.7 g/dL (ref 12.0–15.0)
Lymphocytes Relative: 7 %
Lymphs Abs: 1.6 10*3/uL (ref 0.7–4.0)
MCH: 28.5 pg (ref 26.0–34.0)
MCHC: 34.2 g/dL (ref 30.0–36.0)
MCV: 83.4 fL (ref 78.0–100.0)
Monocytes Absolute: 1.7 10*3/uL — ABNORMAL HIGH (ref 0.1–1.0)
Monocytes Relative: 8 %
NEUTROS ABS: 18.7 10*3/uL — AB (ref 1.7–7.7)
NEUTROS PCT: 84 %
PLATELETS: 484 10*3/uL — AB (ref 150–400)
RBC: 4.81 MIL/uL (ref 3.87–5.11)
RDW: 14.2 % (ref 11.5–15.5)
WBC: 22.3 10*3/uL — AB (ref 4.0–10.5)

## 2016-11-01 LAB — PROTIME-INR
INR: 2.42
PROTHROMBIN TIME: 26.8 s — AB (ref 11.4–15.2)

## 2016-11-01 LAB — I-STAT CG4 LACTIC ACID, ED: Lactic Acid, Venous: 2.36 mmol/L (ref 0.5–1.9)

## 2016-11-01 LAB — CK: Total CK: 24 U/L — ABNORMAL LOW (ref 38–234)

## 2016-11-01 LAB — INFLUENZA PANEL BY PCR (TYPE A & B)
Influenza A By PCR: NEGATIVE
Influenza B By PCR: NEGATIVE

## 2016-11-01 LAB — TROPONIN I: Troponin I: <0.03 ng/mL (ref <0.03)

## 2016-11-01 LAB — LIPASE, BLOOD: Lipase: 20 U/L (ref 11–51)

## 2016-11-01 MED ORDER — VANCOMYCIN HCL IN DEXTROSE 1-5 GM/200ML-% IV SOLN
1000.0000 mg | Freq: Once | INTRAVENOUS | Status: AC
  Administered 2016-11-01: 1000 mg via INTRAVENOUS
  Filled 2016-11-01: qty 200

## 2016-11-01 MED ORDER — VITAMIN D (ERGOCALCIFEROL) 1.25 MG (50000 UNIT) PO CAPS: 50000.0000 [IU] | ORAL_CAPSULE | ORAL | Status: DC

## 2016-11-01 MED ORDER — CYCLOBENZAPRINE HCL 10 MG PO TABS: 5.0000 mg | ORAL_TABLET | Freq: Three times a day (TID) | ORAL | Status: DC | PRN

## 2016-11-01 MED ORDER — SODIUM CHLORIDE 0.9 % IV BOLUS (SEPSIS)
30.0000 mL/kg | Freq: Once | INTRAVENOUS | Status: AC
  Administered 2016-11-01: 1686 mL via INTRAVENOUS

## 2016-11-01 MED ORDER — VANCOMYCIN HCL IN DEXTROSE 750-5 MG/150ML-% IV SOLN
750.0000 mg | INTRAVENOUS | Status: DC
  Filled 2016-11-01: qty 150

## 2016-11-01 MED ORDER — DEXTROSE 5 % IV SOLN
1.0000 g | INTRAVENOUS | Status: DC
  Administered 2016-11-02 – 2016-11-03 (×2): 1 g via INTRAVENOUS
  Filled 2016-11-01 (×4): qty 10

## 2016-11-01 MED ORDER — DICLOFENAC SODIUM 1 % TD GEL
2.0000 g | Freq: Four times a day (QID) | TRANSDERMAL | Status: DC
  Filled 2016-11-01: qty 100

## 2016-11-01 MED ORDER — AZELASTINE HCL 0.1 % NA SOLN: 2.0000 | Freq: Two times a day (BID) | NASAL | Status: DC | PRN

## 2016-11-01 MED ORDER — SODIUM CHLORIDE 0.9 % IV BOLUS (SEPSIS)
500.0000 mL | Freq: Once | INTRAVENOUS | Status: AC
  Administered 2016-11-01: 500 mL via INTRAVENOUS

## 2016-11-01 MED ORDER — ALBUTEROL SULFATE HFA 108 (90 BASE) MCG/ACT IN AERS: 1.0000 | INHALATION_SPRAY | Freq: Four times a day (QID) | RESPIRATORY_TRACT | Status: DC | PRN

## 2016-11-01 MED ORDER — ACETAMINOPHEN 325 MG PO TABS
650.0000 mg | ORAL_TABLET | Freq: Four times a day (QID) | ORAL | Status: DC | PRN
  Administered 2016-11-01: 650 mg via ORAL
  Filled 2016-11-01: qty 2

## 2016-11-01 MED ORDER — WARFARIN SODIUM 2 MG PO TABS
2.0000 mg | ORAL_TABLET | Freq: Once | ORAL | Status: DC
  Filled 2016-11-01: qty 1

## 2016-11-01 MED ORDER — WARFARIN - PHARMACIST DOSING INPATIENT
Freq: Every day | Status: DC
  Administered 2016-11-01: 19:00:00

## 2016-11-01 MED ORDER — ACETAMINOPHEN 650 MG RE SUPP: 650.0000 mg | Freq: Four times a day (QID) | RECTAL | Status: DC | PRN

## 2016-11-01 MED ORDER — TRAMADOL HCL 50 MG PO TABS: 50.0000 mg | ORAL_TABLET | Freq: Four times a day (QID) | ORAL | Status: DC | PRN

## 2016-11-01 MED ORDER — DEXTROSE 5 % IV SOLN
1.0000 g | Freq: Once | INTRAVENOUS | Status: AC
  Administered 2016-11-01: 1 g via INTRAVENOUS
  Filled 2016-11-01: qty 10

## 2016-11-01 MED ORDER — SODIUM CHLORIDE 0.9 % IV SOLN
Freq: Once | INTRAVENOUS | Status: AC
  Administered 2016-11-01: 09:00:00 via INTRAVENOUS

## 2016-11-01 MED ORDER — ALBUTEROL SULFATE (2.5 MG/3ML) 0.083% IN NEBU
2.5000 mg | INHALATION_SOLUTION | Freq: Four times a day (QID) | RESPIRATORY_TRACT | Status: DC | PRN
Start: 2016-11-01 — End: 2016-11-03

## 2016-11-01 MED ORDER — SENNOSIDES-DOCUSATE SODIUM 8.6-50 MG PO TABS
2.0000 | ORAL_TABLET | Freq: Two times a day (BID) | ORAL | Status: DC
  Administered 2016-11-02 – 2016-11-03 (×3): 2 via ORAL
  Filled 2016-11-01 (×3): qty 2

## 2016-11-01 MED ORDER — ESTROGENS, CONJUGATED 0.625 MG/GM VA CREA
1.0000 | TOPICAL_CREAM | VAGINAL | Status: DC
  Filled 2016-11-01: qty 30

## 2016-11-01 MED ORDER — FLUTICASONE PROPIONATE 50 MCG/ACT NA SUSP
2.0000 | Freq: Every day | NASAL | Status: DC
  Administered 2016-11-03: 2 via NASAL
  Filled 2016-11-01: qty 16

## 2016-11-01 MED ORDER — SODIUM CHLORIDE 0.9 % IV SOLN
INTRAVENOUS | Status: AC
  Administered 2016-11-01: 18:00:00 via INTRAVENOUS

## 2016-11-01 MED ORDER — POLYETHYLENE GLYCOL 3350 17 G PO PACK
17.0000 g | PACK | Freq: Every day | ORAL | Status: DC
  Administered 2016-11-02 – 2016-11-03 (×2): 17 g via ORAL
  Filled 2016-11-01 (×2): qty 1

## 2016-11-01 NOTE — ED Notes (Signed)
Accidentally exported same EKG from earlier.

## 2016-11-01 NOTE — ED Notes (Signed)
Patient denies pain and is resting comfortably.  

## 2016-11-01 NOTE — Consult Note (Signed)
Admission H&P    Chief Complaint: Generalized weakness.  HPI: Vanessa Page is an 83 y.o. female with a history of mitral regurgitation, factor C deficiency, deep vein thrombosis on anticoagulation, and aortic regurgitation, with complaint of progressive generalized weakness over the past 4-5 days with increasing difficulty with being able to stand and walk as well as sit up in bed without assistance. She was found to have urinary tract infection as well as elevated WBC count of 22,000. She was afebrile on admission. She was started on Bactrim about 12 days ago and began to notice weakness and myalgias after Bactrim was started, but is Significantly worse over the past 5 days. She is been started on Rocephin and vancomycin. Blood sugar and electrolytes were unremarkable except for slight hyponatremia. Urine blood cultures pending. CK was low at 24. Influenza panel is pending. MRI of the brain showed no acute findings.  Past Medical History:  Diagnosis Date  . Aortic regurgitation 12/24/2013   moderate  . Bronchiectasis ([place])   . Bursitis of right shoulder   . DVT (deep venous thrombosis) ([place])   . 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 Mitral regurgitation 12/24/2013   mild    Past Surgical History:  Procedure Laterality Date  . INTRAMEDULLARY (IM) NAIL INTERTROCHANTERIC Left 12/24/2013   Procedure: INTRAMEDULLARY (IM) NAIL INTERTROCHANTRIC;  Surgeon: Mcarthur Rossetti, MD;  Location: [facility];  Service: Orthopedics;  Laterality: Left;  . THYROIDECTOMY, PARTIAL    . TONSILLECTOMY      Family History  Problem Relation Age of Onset  . Prostate cancer Father    Social History:  reports that she has never smoked. She does not have any smokeless tobacco history on file. She reports that she does not drink alcohol or use drugs.  Allergies:  Allergies  Allergen Reactions  . Codeine Other (See Comments)    Unknown reaction  . Other Other  (See Comments)    Patient has an intolerance to Augmentin,Zithromax,Bioxin,Avalox, & Erythromycin. She can not take them unless they are needed for Bronchiectasis    Medications Prior to Admission  Medication Sig Dispense Refill  . albuterol (PROVENTIL HFA;VENTOLIN HFA) 108 (90 BASE) MCG/ACT inhaler Inhale 1 puff into the lungs every 6 (six) hours as needed for wheezing or shortness of breath.    Marland Kitchen azelastine (ASTELIN) 137 MCG/SPRAY nasal spray Place 2 sprays into both nostrils 2 (two) times daily as needed for allergies. Use in each nostril as directed    . calcium acetate (PHOSLO) 667 MG capsule Take 667 mg by mouth daily.    . cefdinir (OMNICEF) 300 MG capsule Take 300 mg by mouth 2 (two) times daily.    Marland Kitchen conjugated estrogens (PREMARIN) vaginal cream Place 1 Applicatorful vaginally 2 (two) times a week.    . denosumab (PROLIA) 60 MG/ML SOLN injection Inject 60 mg into the skin every 6 (six) months. Administer in upper arm, thigh, or abdomen    . fluticasone (FLONASE) 50 MCG/ACT nasal spray Place into both nostrils daily.    . Vitamin D, Ergocalciferol, (DRISDOL) 50000 units CAPS capsule Take 50,000 Units by mouth every 30 (thirty) days.    Marland Kitchen warfarin (COUMADIN) 2 MG tablet Take 1-2 mg by mouth as directed. Take 1 tablet (11m) by mouth every day of the week except Friday take 0.5 tablet (127m    . alendronate (FOSAMAX) 70 MG tablet Take 70 mg by mouth once a week. Take with a full glass  of water on an empty stomach.    . benzonatate (TESSALON) 100 MG capsule Take 100 mg by mouth 3 (three) times daily as needed for cough.    Marland Kitchen CALCIUM CARBONATE PO Take 1 tablet by mouth daily.    . cyclobenzaprine (FLEXERIL) 5 MG tablet Take 1 tablet (5 mg total) by mouth 3 (three) times daily as needed for muscle spasms. 30 tablet 0  . diclofenac sodium (VOLTAREN) 1 % GEL Apply 2 g topically 4 (four) times daily. Apply to left knee 3 Tube 1  . guaiFENesin (MUCINEX) 600 MG 12 hr tablet Take 600 mg by mouth 2  (two) times daily as needed for cough.    Marland Kitchen HYDROcodone-acetaminophen (NORCO/VICODIN) 5-325 MG per tablet Take 1 tablet by mouth every 6 (six) hours as needed for severe pain. 45 tablet 0  . lisinopril (PRINIVIL,ZESTRIL) 5 MG tablet Take 5 mg by mouth 2 (two) times daily.    . polyethylene glycol (MIRALAX / GLYCOLAX) packet Take 17 g by mouth daily. 14 each 0  . senna-docusate (SENOKOT-S) 8.6-50 MG per tablet Take 2 tablets by mouth 2 (two) times daily.    . traMADol (ULTRAM) 50 MG tablet Take 1 tablet (50 mg total) by mouth every 6 (six) hours as needed for moderate pain. 30 tablet 0  . valACYclovir (VALTREX) 500 MG tablet Take 500 mg by mouth 2 (two) times daily as needed. For fever blisters      ROS: History obtained from chart review and the patient  General ROS: negative for - chills, fatigue, fever, night sweats, weight gain or weight loss Psychological ROS: negative for - behavioral disorder, hallucinations, memory difficulties, mood swings or suicidal ideation Ophthalmic ROS: negative for - blurry vision, double vision, eye pain or loss of vision ENT ROS: negative for - epistaxis, nasal discharge, oral lesions, sore throat, tinnitus or vertigo Allergy and Immunology ROS: negative for - hives or itchy/watery eyes Hematological and Lymphatic ROS: negative for - bleeding problems, bruising or swollen lymph nodes Endocrine ROS: negative for - galactorrhea, hair pattern changes, polydipsia/polyuria or temperature intolerance Respiratory ROS: negative for - cough, hemoptysis, shortness of breath or wheezing Cardiovascular ROS: negative for - chest pain, dyspnea on exertion, edema or irregular heartbeat Gastrointestinal ROS: negative for - abdominal pain, diarrhea, hematemesis, nausea/vomiting or stool incontinence Genito-Urinary ROS: Urinary frequency Musculoskeletal ROS: As noted in present illness Neurological ROS: as noted in HPI Dermatological ROS: negative for rash and skin lesion  changes  Physical Examination: Blood pressure 124/63, pulse 86, temperature 98.3 F (36.8 C), temperature source Oral, resp. rate 18, height 5' (1.524 m), weight 56.2 kg (124 lb), SpO2 97 %.  HEENT-  Normocephalic, no lesions, without obvious abnormality.  Normal external eye and conjunctiva.  Normal TM's bilaterally.  Normal auditory canals and external ears. Normal external nose, mucus membranes and septum.  Normal pharynx. Neck supple with no masses, nodes, nodules or enlargement. Cardiovascular - regular rate and rhythm, S1, S2 normal, no murmur, click, rub or gallop Lungs - chest clear, no wheezing, rales, normal symmetric air entry Abdomen - soft, non-tender; bowel sounds normal; no masses,  no organomegaly Extremities - no joint deformities, effusion, or inflammation and no edema  Neurologic Examination: Mental Status: Alert, oriented, thought content appropriate.  Speech fluent without evidence of aphasia. Able to follow commands without difficulty. Cranial Nerves: II-Visual fields were normal. III/IV/VI-Pupils were equal and reacted normally to light. Extraocular movements were full and conjugate.    V/VII-no facial numbness and no  facial weakness. VIII-normal. X-normal speech and symmetrical palatal movement. Motor: Mild weakness proximally and distally of left upper extremity is normal strength of right upper extremity. Patient also had moderate discomfort with manual muscle testing of left upper extremity there was moderate weakness of hip flexors bilaterally. Movement also appeared to be limited due to pain on movement. Quadriceps strength was normal bilaterally. Dorsiflexors and plantar flexors of the feet were also normal. Sensory: Normal throughout. Deep Tendon Reflexes: 1+ and symmetric, including ankle reflexes. Plantars: Flexor bilaterally Cerebellar: Normal finger-to-nose testing except for minimal intentional tremor bilaterally.   Results for orders placed or  performed during the hospital encounter of 11/01/16 (from the past 48 hour(s))  Comprehensive metabolic panel     Status: Abnormal   Collection Time: 11/01/16  7:40 AM  Result Value Ref Range   Sodium 132 (L) 135 - 145 mmol/L   Potassium 3.8 3.5 - 5.1 mmol/L   Chloride 98 (L) 101 - 111 mmol/L   CO2 25 22 - 32 mmol/L   Glucose, Bld 118 (H) 65 - 99 mg/dL   BUN 12 6 - 20 mg/dL   Creatinine, Ser 0.70 0.44 - 1.00 mg/dL   Calcium 8.1 (L) 8.9 - 10.3 mg/dL   Total Protein 6.2 (L) 6.5 - 8.1 g/dL   Albumin 2.5 (L) 3.5 - 5.0 g/dL   AST 53 (H) 15 - 41 U/L   ALT 43 14 - 54 U/L   Alkaline Phosphatase 96 38 - 126 U/L   Total Bilirubin 0.9 0.3 - 1.2 mg/dL   GFR calc non Af Amer >60 >60 mL/min   GFR calc Af Amer >60 >60 mL/min    Comment: (NOTE) The eGFR has been calculated using the CKD EPI equation. This calculation has not been validated in all clinical situations. eGFR's persistently <60 mL/min signify possible Chronic Kidney Disease.    Anion gap 9 5 - 15  Lipase, blood     Status: None   Collection Time: 11/01/16  7:40 AM  Result Value Ref Range   Lipase 20 11 - 51 U/L  Troponin I     Status: None   Collection Time: 11/01/16  7:40 AM  Result Value Ref Range   Troponin I <0.03 <0.03 ng/mL  CBC with Differential     Status: Abnormal   Collection Time: 11/01/16  7:40 AM  Result Value Ref Range   WBC 22.3 (H) 4.0 - 10.5 K/uL   RBC 4.81 3.87 - 5.11 MIL/uL   Hemoglobin 13.7 12.0 - 15.0 g/dL   HCT 40.1 36.0 - 46.0 %   MCV 83.4 78.0 - 100.0 fL   MCH 28.5 26.0 - 34.0 pg   MCHC 34.2 30.0 - 36.0 g/dL   RDW 14.2 11.5 - 15.5 %   Platelets 484 (H) 150 - 400 K/uL   Neutrophils Relative % 84 %   Neutro Abs 18.7 (H) 1.7 - 7.7 K/uL   Lymphocytes Relative 7 %   Lymphs Abs 1.6 0.7 - 4.0 K/uL   Monocytes Relative 8 %   Monocytes Absolute 1.7 (H) 0.1 - 1.0 K/uL   Eosinophils Relative 1 %   Eosinophils Absolute 0.3 0.0 - 0.7 K/uL   Basophils Relative 0 %   Basophils Absolute 0.1 0.0 - 0.1 K/uL    WBC Morphology TOXIC GRANULATION   CK     Status: Abnormal   Collection Time: 11/01/16  7:40 AM  Result Value Ref Range   Total CK 24 (L) 38 - 234 U/L  Urinalysis, Routine w reflex microscopic     Status: Abnormal   Collection Time: 11/01/16  7:44 AM  Result Value Ref Range   Color, Urine AMBER (A) YELLOW    Comment: BIOCHEMICALS MAY BE AFFECTED BY COLOR   APPearance CLOUDY (A) CLEAR   Specific Gravity, Urine 1.022 1.005 - 1.030   pH 6.0 5.0 - 8.0   Glucose, UA NEGATIVE NEGATIVE mg/dL   Hgb urine dipstick SMALL (A) NEGATIVE   Bilirubin Urine SMALL (A) NEGATIVE   Ketones, ur 15 (A) NEGATIVE mg/dL   Protein, ur NEGATIVE NEGATIVE mg/dL   Nitrite NEGATIVE NEGATIVE   Leukocytes, UA SMALL (A) NEGATIVE  Urinalysis, Microscopic (reflex)     Status: Abnormal   Collection Time: 11/01/16  7:44 AM  Result Value Ref Range   RBC / HPF 0-5 0 - 5 RBC/hpf   WBC, UA 6-30 0 - 5 WBC/hpf   Bacteria, UA MANY (A) NONE SEEN   Squamous Epithelial / LPF 6-30 (A) NONE SEEN   Mucous PRESENT   Influenza panel by PCR (type A & B)     Status: None   Collection Time: 11/01/16 10:27 AM  Result Value Ref Range   Influenza A By PCR NEGATIVE NEGATIVE   Influenza B By PCR NEGATIVE NEGATIVE    Comment: (NOTE) The Xpert Xpress Flu assay is intended as an aid in the diagnosis of  influenza and should not be used as a sole basis for treatment.  This  assay is FDA approved for nasopharyngeal swab specimens only. Nasal  washings and aspirates are unacceptable for Xpert Xpress Flu testing. Performed at [facility], [place] [address] [place], [place] [address]   Protime-INR     Status: Abnormal   Collection Time: 11/01/16 11:15 AM  Result Value Ref Range   Prothrombin Time 26.8 (H) 11.4 - 15.2 seconds   INR 2.42   I-Stat CG4 Lactic Acid, ED     Status: Abnormal   Collection Time: 11/01/16 11:28 AM  Result Value Ref Range   Lactic Acid, Venous 2.36 (HH) 0.5 - 1.9 mmol/L   Comment NOTIFIED PHYSICIAN     Dg Chest 2 View  Result Date: 11/01/2016 CLINICAL DATA:  Generalized body aches. EXAM: CHEST  2 VIEW COMPARISON:  Chest x-ray 01/09/2014 FINDINGS: The heart size is upper limits of normal. Atherosclerotic changes are present at the aortic arch. Changes COPD are again seen. There is no edema or effusion. No focal airspace disease is present. IMPRESSION: 1. No acute cardiopulmonary disease. 2. Aortic atherosclerosis. 3. Changes of COPD. Electronically Signed   By: San Morelle M.D.   On: 11/01/2016 09:07   Mr Brain Wo Contrast  Result Date: 11/01/2016 CLINICAL DATA:  Weakness EXAM: MRI HEAD WITHOUT CONTRAST TECHNIQUE: Multiplanar, multiecho pulse sequences of the brain and surrounding structures were obtained without intravenous contrast. COMPARISON:  None. FINDINGS: Brain: Moderate atrophy. Ventricular enlargement consistent with atrophy. Negative for acute infarct. Hyperintensity in the cerebral white matter bilaterally. Hyperintensity in the left thalamus. These areas are most likely due to chronic ischemia. Negative for hemorrhage or mass. No shift of the midline structures. Vascular: Normal arterial flow voids. Skull and upper cervical spine: Negative Sinuses/Orbits: Paranasal sinuses clear. Bilateral mastoid effusion. Normal orbit. Other: None IMPRESSION: Atrophy and moderate chronic ischemic change.  No acute abnormality Bilateral mastoid sinus effusion. Electronically Signed   By: Franchot Gallo M.D.   On: 11/01/2016 18:48    Assessment/Plan 83 year old lady with complaint of progressive generalized weakness  and myalgias in the setting of acute urinary tract infection. Influenza panel is pending. Patient has no indication of acute myositis with CK slightly low, and GBS is unlikely with intact distal lower extremity reflexes. Patient has begun to feel stronger on current treatment regimen and IV fluids.  Recommendations: 1. No change in current management with IV fluids and antibiotic  intervention 2. ESR 3. Physical therapy consult 4. No further neurodiagnostic studies indicated, for now  We will continue to follow this patient with you.  C.R. Nicole Kindred, [place] [facility] [phone]  11/01/2016, 8:44 PM

## 2016-11-01 NOTE — ED Notes (Addendum)
Report called to inpatient RN, [place].

## 2016-11-01 NOTE — Progress Notes (Signed)
Pharmacy Antibiotic Note Vanessa Page is a 83 y.o. female admitted on 11/01/2016 with sepsis.  Pharmacy has been consulted for vancomycin dosing.  Plan: 1. Vancomycin 750  IV every 24 hours.  Goal trough 15-20 mcg/mL. 2. Continue Ceftriaxone 1 gram IV every 24 hours  3. Follow up culture data   Height: 5' (152.4 cm) Weight: 124 lb (56.2 kg) IBW/kg (Calculated) : 45.5  Temp (24hrs), Avg:98.2 F (36.8 C), Min:97.8 F (36.6 C), Max:98.7 F (37.1 C)   Recent Labs Lab 11/01/16 0740 11/01/16 1128  WBC 22.3*  --   CREATININE 0.70  --   LATICACIDVEN  --  2.36*    Estimated Creatinine Clearance: 41.9 mL/min (by C-G formula based on SCr of 0.7 mg/dL).    Allergies  Allergen Reactions  . Codeine Other (See Comments)    Unknown reaction  . Other Other (See Comments)    Patient has an intolerance to Augmentin,Zithromax,Bioxin,Avalox, & Erythromycin. She can not take them unless they are needed for Bronchiectasis    Antimicrobials this admission: 2/25 ceftriaxone >>  2/25 vancomycin >>   Microbiology results: 2/25 BCx: px 2/25 UCx: px   Thank you for allowing pharmacy to be a part of this patient's care.  Vincenza Hews, PharmD, BCPS 11/01/2016, 4:01 PM

## 2016-11-01 NOTE — H&P (Signed)
TRH H&P   Patient Demographics:    Vanessa Page, is a 83 y.o. female  MRN: [id number]   DOB - [birth year]  Admit Date - 11/01/2016  Outpatient Primary MD for the patient is Pcp Not In [place]  Referring MD/NP/PA:  Fredonia Highland  Outpatient Specialists:   Patient coming from: hotel  Chief Complaint  Patient presents with  . Generalized Body Aches      HPI:    Vanessa Page  is a 83 y.o. female, w bronchiectasis, DVT (right), factor C def, apparently presents w/ weakness since Friday.  Weakness is generalized.  Pt denies back pain.  Pt couldn't get up out of chair. Pt denies fever, chills, cp, palp, sob.   Pt states that she has incontinence of urine.  Pt notes that she was on Bactrim recently and attributes her symptoms of weakness, and myalgia to the antibiotic.  Pt denies any viral symptoms. Pt was weaker on Saturday so she was brought in today to [facility] for evaluation.   In ED, wbc 22, and pt noted to have uti,  Though specimen may have been poor according to ED. Pt will be admitted for weakness, and leukocytosis,  uti.     Review of systems:    In addition to the HPI above, No Fever-chills, No Headache, No changes with Vision or hearing, No problems swallowing food or Liquids, No Chest pain, Cough or Shortness of Breath, No Abdominal pain, No Nausea or Vommitting, Bowel movements are regular, No Blood in stool or Urine, No dysuria, + slight frequency of urination.  No new skin rashes or bruises, No new joints pains-aches,  No new weakness, tingling, numbness in any extremity, No recent weight gain or loss, No polyuria, polydypsia or polyphagia, No significant Mental Stressors.  A full 10 point Review of Systems was done, except as stated above, all other Review of Systems were negative.   With Past History of the following :      Past Medical History:  Diagnosis Date  . Aortic regurgitation 12/24/2013   moderate  . Bronchiectasis ([place])   . Bursitis of right shoulder   . DVT (deep venous thrombosis) ([place])   . 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 Mitral regurgitation 12/24/2013   mild      Past Surgical History:  Procedure Laterality Date  . INTRAMEDULLARY (IM) NAIL INTERTROCHANTERIC Left 12/24/2013   Procedure: INTRAMEDULLARY (IM) NAIL INTERTROCHANTRIC;  Surgeon: Mcarthur Rossetti, MD;  Location: [facility];  Service: Orthopedics;  Laterality: Left;  . THYROIDECTOMY, PARTIAL    . TONSILLECTOMY        Social History:     Social History  Substance Use Topics  . Smoking status: Never Smoker  . Smokeless tobacco: Not on file  . Alcohol use No  Comment: occasional wine      Lives - at home  Mobility - unable to walk currently but typically able to do so   Family History :     Family History  Problem Relation Age of Onset  . Prostate cancer Father       Home Medications:   Prior to Admission medications   Medication Sig Start Date End Date Taking? Authorizing Provider  albuterol (PROVENTIL HFA;VENTOLIN HFA) 108 (90 BASE) MCG/ACT inhaler Inhale 1 puff into the lungs every 6 (six) hours as needed for wheezing or shortness of breath.   Yes Historical Provider, MD  azelastine (ASTELIN) 137 MCG/SPRAY nasal spray Place 2 sprays into both nostrils 2 (two) times daily as needed for allergies. Use in each nostril as directed   Yes Historical Provider, MD  calcium acetate (PHOSLO) 667 MG capsule Take 667 mg by mouth daily.   Yes Historical Provider, MD  cefdinir (OMNICEF) 300 MG capsule Take 300 mg by mouth 2 (two) times daily.   Yes Historical Provider, MD  conjugated estrogens (PREMARIN) vaginal cream Place 1 Applicatorful vaginally 2 (two) times a week.   Yes Historical Provider, MD  denosumab (PROLIA) 60 MG/ML SOLN injection Inject 60 mg into the  skin every 6 (six) months. Administer in upper arm, thigh, or abdomen   Yes Historical Provider, MD  fluticasone (FLONASE) 50 MCG/ACT nasal spray Place into both nostrils daily.   Yes Historical Provider, MD  Vitamin D, Ergocalciferol, (DRISDOL) 50000 units CAPS capsule Take 50,000 Units by mouth every 30 (thirty) days.   Yes Historical Provider, MD  warfarin (COUMADIN) 2 MG tablet Take 1-2 mg by mouth as directed. Take 1 tablet (66m) by mouth every day of the week except Friday take 0.5 tablet (179m   Yes Historical Provider, MD  alendronate (FOSAMAX) 70 MG tablet Take 70 mg by mouth once a week. Take with a full glass of water on an empty stomach.    Historical Provider, MD  benzonatate (TESSALON) 100 MG capsule Take 100 mg by mouth 3 (three) times daily as needed for cough.    Historical Provider, MD  CALCIUM CARBONATE PO Take 1 tablet by mouth daily.    Historical Provider, MD  cyclobenzaprine (FLEXERIL) 5 MG tablet Take 1 tablet (5 mg total) by mouth 3 (three) times daily as needed for muscle spasms. 01/05/14   PaBary LerichePA-C  diclofenac sodium (VOLTAREN) 1 % GEL Apply 2 g topically 4 (four) times daily. Apply to left knee 01/05/14   PaIvan Anchorsove, PA-C  guaiFENesin (MUCINEX) 600 MG 12 hr tablet Take 600 mg by mouth 2 (two) times daily as needed for cough.    Historical Provider, MD  HYDROcodone-acetaminophen (NORCO/VICODIN) 5-325 MG per tablet Take 1 tablet by mouth every 6 (six) hours as needed for severe pain. 01/05/14   PaIvan Anchorsove, PA-C  lisinopril (PRINIVIL,ZESTRIL) 5 MG tablet Take 5 mg by mouth 2 (two) times daily.    Historical Provider, MD  polyethylene glycol (MIRALAX / GLYCOLAX) packet Take 17 g by mouth daily. 01/13/14   FoPamella PertMD  senna-docusate (SENOKOT-S) 8.6-50 MG per tablet Take 2 tablets by mouth 2 (two) times daily. 01/05/14   PaBary LerichePA-C  traMADol (ULTRAM) 50 MG tablet Take 1 tablet (50 mg total) by mouth every 6 (six) hours as needed for moderate pain. 01/05/14    PaBary LerichePA-C  valACYclovir (VALTREX) 500 MG tablet Take 500 mg by mouth 2 (two) times  daily as needed. For fever blisters    Historical Provider, MD     Allergies:     Allergies  Allergen Reactions  . Codeine Other (See Comments)    Unknown reaction  . Other Other (See Comments)    Patient has an intolerance to Augmentin,Zithromax,Bioxin,Avalox, & Erythromycin. She can not take them unless they are needed for Bronchiectasis     Physical Exam:   Vitals  Blood pressure (!) 110/56, pulse 75, temperature 97.5 F (36.4 C), temperature source Oral, resp. rate 18, height 5' (1.524 m), weight 56.2 kg (124 lb), SpO2 98 %.   1. General lying in bed in NAD,    2. Normal affect and insight, Not Suicidal or Homicidal, Awake Alert, Oriented X 3.  3. No F.N deficits, ALL C.Nerves Intact, Strength 5-/5 in the bilateral lower extremities,  5/5 in the bilateral upper extremities, Sensation intact all 4 extremities, Plantars down going.  4. Ears and Eyes appear Normal, Conjunctivae clear, PERRLA. Moist Oral Mucosa.  5. Supple Neck, No JVD, No cervical lymphadenopathy appriciated, No Carotid Bruits.  6. Symmetrical Chest wall movement, Good air movement bilaterally, CTAB.  7. RRR, No Gallops, Rubs or Murmurs, No Parasternal Heave.  8. Positive Bowel Sounds, Abdomen Soft, No tenderness, No organomegaly appriciated,No rebound -guarding or rigidity.  9.  No Cyanosis, Normal Skin Turgor, No Skin Rash or Bruise.  10. Good muscle tone,  joints appear normal , no effusions, Normal ROM.  11. No Palpable Lymph Nodes in Neck or Axillae     Data Review:    CBC  Recent Labs Lab 11/01/16 0740  WBC 22.3*  HGB 13.7  HCT 40.1  PLT 484*  MCV 83.4  MCH 28.5  MCHC 34.2  RDW 14.2  LYMPHSABS 1.6  MONOABS 1.7*  EOSABS 0.3  BASOSABS 0.1   ------------------------------------------------------------------------------------------------------------------  Chemistries   Recent  Labs Lab 11/01/16 0740  NA 132*  K 3.8  CL 98*  CO2 25  GLUCOSE 118*  BUN 12  CREATININE 0.70  CALCIUM 8.1*  AST 53*  ALT 43  ALKPHOS 96  BILITOT 0.9   ------------------------------------------------------------------------------------------------------------------ estimated creatinine clearance is 41.9 mL/min (by C-G formula based on SCr of 0.7 mg/dL). ------------------------------------------------------------------------------------------------------------------ No results for input(s): TSH, T4TOTAL, T3FREE, THYROIDAB in the last 72 hours.  Invalid input(s): FREET3  Coagulation profile  Recent Labs Lab 11/01/16 1115  INR 2.42   ------------------------------------------------------------------------------------------------------------------- No results for input(s): DDIMER in the last 72 hours. -------------------------------------------------------------------------------------------------------------------  Cardiac Enzymes  Recent Labs Lab 11/01/16 0740  TROPONINI <0.03   ------------------------------------------------------------------------------------------------------------------ No results found for: BNP   ---------------------------------------------------------------------------------------------------------------  Urinalysis    Component Value Date/Time   COLORURINE AMBER (A) 11/01/2016 0744   APPEARANCEUR CLOUDY (A) 11/01/2016 0744   LABSPEC 1.022 11/01/2016 0744   PHURINE 6.0 11/01/2016 0744   GLUCOSEU NEGATIVE 11/01/2016 0744   HGBUR SMALL (A) 11/01/2016 0744   BILIRUBINUR SMALL (A) 11/01/2016 0744   KETONESUR 15 (A) 11/01/2016 0744   PROTEINUR NEGATIVE 11/01/2016 0744   UROBILINOGEN 1.0 12/26/2013 2232   NITRITE NEGATIVE 11/01/2016 0744   LEUKOCYTESUR SMALL (A) 11/01/2016 0744    ----------------------------------------------------------------------------------------------------------------   Imaging Results:    Dg Chest 2  View  Result Date: 11/01/2016 CLINICAL DATA:  Generalized body aches. EXAM: CHEST  2 VIEW COMPARISON:  Chest x-ray 01/09/2014 FINDINGS: The heart size is upper limits of normal. Atherosclerotic changes are present at the aortic arch. Changes COPD are again seen. There is no edema or effusion. No focal airspace disease  is present. IMPRESSION: 1. No acute cardiopulmonary disease. 2. Aortic atherosclerosis. 3. Changes of COPD. Electronically Signed   By: San Morelle M.D.   On: 11/01/2016 09:07   Mr Brain Wo Contrast  Result Date: 11/01/2016 CLINICAL DATA:  Weakness EXAM: MRI HEAD WITHOUT CONTRAST TECHNIQUE: Multiplanar, multiecho pulse sequences of the brain and surrounding structures were obtained without intravenous contrast. COMPARISON:  None. FINDINGS: Brain: Moderate atrophy. Ventricular enlargement consistent with atrophy. Negative for acute infarct. Hyperintensity in the cerebral white matter bilaterally. Hyperintensity in the left thalamus. These areas are most likely due to chronic ischemia. Negative for hemorrhage or mass. No shift of the midline structures. Vascular: Normal arterial flow voids. Skull and upper cervical spine: Negative Sinuses/Orbits: Paranasal sinuses clear. Bilateral mastoid effusion. Normal orbit. Other: None IMPRESSION: Atrophy and moderate chronic ischemic change.  No acute abnormality Bilateral mastoid sinus effusion. Electronically Signed   By: Franchot Gallo M.D.   On: 11/01/2016 18:48       Assessment & Plan:    Active Problems:   History of DVT of lower extremity   Myalgia   Leukocytosis   UTI (urinary tract infection)   Hyponatremia   Weakness    1. Uti Await urine culture Blood culture x2 Rocephin 20m iv qday  2. Leukocytosis secondary to #1 Cbc in am  3. Hyponatremia Check tsh, cortisol, serum osm, urine osm, urine sodium Hydrate gently with ns iv Check cmp in am  4. DVT  Coumadin pharmacy to dose  5. Myalgia Check tsh, cpk, esr,  ana  Weakness particularly in the bilateral lower ext Check acetylcholine ab  MRI brain  Consider nct/emg if not improving r/o GBS Neurology consultation  DVT Prophylaxis coumadin    AM Labs Ordered, also please review Full Orders  Family Communication: Admission, patients condition and plan of care including tests being ordered have been discussed with the patient  who indicate understanding and agree with the plan and Code Status.  Code Status FULL CODE  Likely DC to  home  Condition GUARDED    Consults called:  none  Admission status: observation  Time spent in minutes : 45 minutes   JJani GravelM.D on 11/01/2016 at 7:04 PM  Between 7am to 7pm - Pager - [phone] After 7pm go to [email or web address] - password [facility] - Office  [phone]

## 2016-11-01 NOTE — ED Provider Notes (Signed)
[place] DEPT [facility] Provider Note   CSN: [id number] Arrival date & time: 11/01/16  0700     History   Chief Complaint Chief Complaint  Patient presents with  . Generalized Body Aches    HPI Vanessa Page is a 83 y.o. female.  HPI Patient has been taking Bactrim for a week for a venous stasis ulcer. She finished antibiotics 4 days ago. She reports that the ulcer has healed very well. She is not having any pain or problems associated with it. She reports however she has been very fatigued over the course of this week. She states that she has had much gastric discomfort although not vomiting or diarrhea. She reports her appetite has been very poor and it has been difficult to eat much more than very small amounts. She states she has been so fatigued and has some much muscle pain that she has stayed in bed for much of the week. She is visiting from out of town. She denies sick contact that she is aware of. Past Medical History:  Diagnosis Date  . Bronchiectasis   . Bursitis of right shoulder   . DVT (deep venous thrombosis)   . 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 Hypertension     Patient Active Problem List   Diagnosis Date Noted  . Myalgia 11/01/2016  . Hypotension, unspecified 01/08/2014  . Knee pain, left 01/08/2014  . Hip fracture requiring operative repair ([place]) 12/26/2013  . Leukocytosis, unspecified 12/25/2013  . Intertrochanteric fracture of left hip ([place]) 12/23/2013  . Hip fracture ([place]) 12/23/2013  . Chronic anticoagulation 12/23/2013  . History of DVT of lower extremity 12/23/2013  . Bronchiectasis ([place]) 12/23/2013  . Essential hypertension 12/23/2013    Past Surgical History:  Procedure Laterality Date  . INTRAMEDULLARY (IM) NAIL INTERTROCHANTERIC Left 12/24/2013   Procedure: INTRAMEDULLARY (IM) NAIL INTERTROCHANTRIC;  Surgeon: Mcarthur Rossetti, MD;  Location: [facility];  Service: Orthopedics;  Laterality: Left;      OB History    No data available       Home Medications    Prior to Admission medications   Medication Sig Start Date End Date Taking? Authorizing Provider  albuterol (PROVENTIL HFA;VENTOLIN HFA) 108 (90 BASE) MCG/ACT inhaler Inhale 1 puff into the lungs every 6 (six) hours as needed for wheezing or shortness of breath.   Yes Historical Provider, MD  azelastine (ASTELIN) 137 MCG/SPRAY nasal spray Place 2 sprays into both nostrils 2 (two) times daily as needed for allergies. Use in each nostril as directed   Yes Historical Provider, MD  calcium acetate (PHOSLO) 667 MG capsule Take 667 mg by mouth daily.   Yes Historical Provider, MD  cefdinir (OMNICEF) 300 MG capsule Take 300 mg by mouth 2 (two) times daily.   Yes Historical Provider, MD  conjugated estrogens (PREMARIN) vaginal cream Place 1 Applicatorful vaginally 2 (two) times a week.   Yes Historical Provider, MD  denosumab (PROLIA) 60 MG/ML SOLN injection Inject 60 mg into the skin every 6 (six) months. Administer in upper arm, thigh, or abdomen   Yes Historical Provider, MD  fluticasone (FLONASE) 50 MCG/ACT nasal spray Place into both nostrils daily.   Yes Historical Provider, MD  Vitamin D, Ergocalciferol, (DRISDOL) 50000 units CAPS capsule Take 50,000 Units by mouth every 30 (thirty) days.   Yes Historical Provider, MD  warfarin (COUMADIN) 2 MG tablet Take 1-2 mg by mouth as directed. Take 1 tablet (2mg ) by mouth every day of  the week except Friday take 0.5 tablet (1mg )   Yes Historical Provider, MD  alendronate (FOSAMAX) 70 MG tablet Take 70 mg by mouth once a week. Take with a full glass of water on an empty stomach.    Historical Provider, MD  benzonatate (TESSALON) 100 MG capsule Take 100 mg by mouth 3 (three) times daily as needed for cough.    Historical Provider, MD  CALCIUM CARBONATE PO Take 1 tablet by mouth daily.    Historical Provider, MD  cyclobenzaprine (FLEXERIL) 5 MG tablet Take 1 tablet (5 mg total) by mouth 3  (three) times daily as needed for muscle spasms. 01/05/14   Bary Leriche, PA-C  diclofenac sodium (VOLTAREN) 1 % GEL Apply 2 g topically 4 (four) times daily. Apply to left knee 01/05/14   Ivan Anchors Love, PA-C  guaiFENesin (MUCINEX) 600 MG 12 hr tablet Take 600 mg by mouth 2 (two) times daily as needed for cough.    Historical Provider, MD  HYDROcodone-acetaminophen (NORCO/VICODIN) 5-325 MG per tablet Take 1 tablet by mouth every 6 (six) hours as needed for severe pain. 01/05/14   Ivan Anchors Love, PA-C  lisinopril (PRINIVIL,ZESTRIL) 5 MG tablet Take 5 mg by mouth 2 (two) times daily.    Historical Provider, MD  polyethylene glycol (MIRALAX / GLYCOLAX) packet Take 17 g by mouth daily. 01/13/14   Pamella Pert, MD  senna-docusate (SENOKOT-S) 8.6-50 MG per tablet Take 2 tablets by mouth 2 (two) times daily. 01/05/14   Bary Leriche, PA-C  traMADol (ULTRAM) 50 MG tablet Take 1 tablet (50 mg total) by mouth every 6 (six) hours as needed for moderate pain. 01/05/14   Bary Leriche, PA-C  valACYclovir (VALTREX) 500 MG tablet Take 500 mg by mouth 2 (two) times daily as needed. For fever blisters    Historical Provider, MD    Family History No family history on file.  Social History Social History  Substance Use Topics  . Smoking status: Never Smoker  . Smokeless tobacco: Not on file  . Alcohol use Yes     Comment: occasional wine      Allergies   Codeine and Other   Review of Systems Review of Systems 10 Systems reviewed and are negative for acute change except as noted in the HPI.   Physical Exam Updated Vital Signs BP 117/61 (BP Location: Right Arm)   Pulse 80   Temp 98.7 F (37.1 C) (Oral)   Resp 20   Ht 5' (1.524 m)   Wt 124 lb (56.2 kg)   SpO2 96%   BMI 24.22 kg/m   Physical Exam  Constitutional: She is oriented to person, place, and time. She appears well-developed and well-nourished. No distress.  HENT:  Head: Normocephalic and atraumatic.  Nose: Nose normal.  Mouth/Throat:  Oropharynx is clear and moist.  Eyes: Conjunctivae and EOM are normal.  Neck: Neck supple.  Cardiovascular: Normal rate and regular rhythm.   No murmur heard. Pulmonary/Chest: Effort normal and breath sounds normal. No respiratory distress.  Abdominal: Soft. There is no tenderness.  Musculoskeletal: She exhibits no edema.  Patient has a dry black eschar on the medial lower right leg. At this is approximately 5 mm. Small amount of surrounding scaling dry skin. No associated erythema and no associated swelling. Remainder of the lower extremity is do not have peripheral edema. Tabs are soft. Patient does have varicosities but none of them are inflamed or erythematous.  Neurological: She is alert and oriented to person,  place, and time. No cranial nerve deficit. She exhibits normal muscle tone. Coordination normal.  Skin: Skin is warm and dry.  Psychiatric: She has a normal mood and affect.  Nursing note and vitals reviewed.    ED Treatments / Results  Labs (all labs ordered are listed, but only abnormal results are displayed) Labs Reviewed  COMPREHENSIVE METABOLIC PANEL - Abnormal; Notable for the following:       Result Value   Sodium 132 (*)    Chloride 98 (*)    Glucose, Bld 118 (*)    Calcium 8.1 (*)    Total Protein 6.2 (*)    Albumin 2.5 (*)    AST 53 (*)    All other components within normal limits  CBC WITH DIFFERENTIAL/PLATELET - Abnormal; Notable for the following:    WBC 22.3 (*)    Platelets 484 (*)    Neutro Abs 18.7 (*)    Monocytes Absolute 1.7 (*)    All other components within normal limits  URINALYSIS, ROUTINE W REFLEX MICROSCOPIC - Abnormal; Notable for the following:    Color, Urine AMBER (*)    APPearance CLOUDY (*)    Hgb urine dipstick SMALL (*)    Bilirubin Urine SMALL (*)    Ketones, ur 15 (*)    Leukocytes, UA SMALL (*)    All other components within normal limits  URINALYSIS, MICROSCOPIC (REFLEX) - Abnormal; Notable for the following:    Bacteria,  UA MANY (*)    Squamous Epithelial / LPF 6-30 (*)    All other components within normal limits  PROTIME-INR - Abnormal; Notable for the following:    Prothrombin Time 26.8 (*)    All other components within normal limits  CK - Abnormal; Notable for the following:    Total CK 24 (*)    All other components within normal limits  I-STAT CG4 LACTIC ACID, ED - Abnormal; Notable for the following:    Lactic Acid, Venous 2.36 (*)    All other components within normal limits  URINE CULTURE  CULTURE, BLOOD (ROUTINE X 2)  CULTURE, BLOOD (ROUTINE X 2)  LIPASE, BLOOD  TROPONIN I  INFLUENZA PANEL BY PCR (TYPE A & B)    EKG  EKG Interpretation  Date/Time:  Sunday November 01 2016 07:19:06 EST Ventricular Rate:  88 PR Interval:    QRS Duration: 82 QT Interval:  377 QTC Calculation: 441 R Axis:   -50 Text Interpretation:  Sinus rhythm Multiform ventricular premature complexes Left anterior fascicular block Low voltage, precordial leads Abnormal R-wave progression, early transition Minimal ST elevation, lateral leads Baseline wander in lead(s) I III aVL V5 agree. no sig change fromprevious Confirmed by Johnney Killian, MD, Jeannie Done [phone]) on 11/01/2016 12:10:43 PM       Radiology Dg Chest 2 View  Result Date: 11/01/2016 CLINICAL DATA:  Generalized body aches. EXAM: CHEST  2 VIEW COMPARISON:  Chest x-ray 01/09/2014 FINDINGS: The heart size is upper limits of normal. Atherosclerotic changes are present at the aortic arch. Changes COPD are again seen. There is no edema or effusion. No focal airspace disease is present. IMPRESSION: 1. No acute cardiopulmonary disease. 2. Aortic atherosclerosis. 3. Changes of COPD. Electronically Signed   By: San Morelle M.D.   On: 11/01/2016 09:07    Procedures Procedures (including critical care time)  Medications Ordered in ED Medications  sodium chloride 0.9 % bolus 1,686 mL (not administered)  cefTRIAXone (ROCEPHIN) 1 g in dextrose 5 % 50 mL IVPB (1 g  Intravenous New Bag/Given 11/01/16 1210)  vancomycin (VANCOCIN) IVPB 1000 mg/200 mL premix (not administered)  0.9 %  sodium chloride infusion ( Intravenous New Bag/Given 11/01/16 0847)  sodium chloride 0.9 % bolus 500 mL (500 mLs Intravenous New Bag/Given 11/01/16 1030)     Initial Impression / Assessment and Plan / ED Course  I have reviewed the triage vital signs and the nursing notes.  Pertinent labs & imaging results that were available during my care of the patient were reviewed by me and considered in my medical decision making (see chart for details).     Consult: Hospitalist for admission Dr. Maudie Mercury  Final Clinical Impressions(s) / ED Diagnoses   Final diagnoses:  Myalgia  Leukocytosis, unspecified type  Bacteremia  Patient is 4 days status post completing a course of Bactrim for a venous stasis ulcer. It appears well-healed and does not appear likely to be infected at this time. Patient however has developed myalgia and weakness which she is still attributing to Bactrim. At this time, she has significant leukocytosis, elevated lactic acidosis concerning for possible bacteremia. Urine does show some white cells but also has squamous epithelial cells and is not a definitive source without any associated symptoms. Culture will be obtained. Blood culture also be obtained in the event there is some possible bacterial seeding from her prior infection. Clinically she is alert and appropriate with stable vital signs. Patient will be admitted for ongoing antibiotic therapy and diagnostic workup is needed.  New Prescriptions New Prescriptions   No medications on file     Charlesetta Shanks, MD 11/01/16 1212

## 2016-11-01 NOTE — ED Notes (Signed)
ED Provider at bedside. 

## 2016-11-01 NOTE — Progress Notes (Addendum)
ANTICOAGULATION CONSULT NOTE - Initial Consult  Pharmacy Consult for coumadin  Indication:  hx of VTE and protein C deficiency  Allergies  Allergen Reactions  . Codeine Other (See Comments)    Unknown reaction  . Other Other (See Comments)    Patient has an intolerance to Augmentin,Zithromax,Bioxin,Avalox, & Erythromycin. She can not take them unless they are needed for Bronchiectasis    Patient Measurements: Height: 5' (152.4 cm) Weight: 124 lb (56.2 kg) IBW/kg (Calculated) : 45.5   Vital Signs: Temp: 97.5 F (36.4 C) (02/25 1600) Temp Source: Oral (02/25 1600) BP: 110/56 (02/25 1600) Pulse Rate: 75 (02/25 1600)  Labs:  Recent Labs  11/01/16 0740 11/01/16 1115  HGB 13.7  --   HCT 40.1  --   PLT 484*  --   LABPROT  --  26.8*  INR  --  2.42  CREATININE 0.70  --   CKTOTAL 24*  --   TROPONINI <0.03  --     Estimated Creatinine Clearance: 41.9 mL/min (by C-G formula based on SCr of 0.7 mg/dL).   Medical History: Past Medical History:  Diagnosis Date  . Aortic regurgitation 12/24/2013   moderate  . Bronchiectasis ([place])   . Bursitis of right shoulder   . DVT (deep venous thrombosis) ([place])   . 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 Mitral regurgitation 12/24/2013   mild    Assessment: 83 yo female with history  of VTE and Protein C deficiency on coumadin PTA. Pharmacy to assist with dosing.  INR 2.4 with last dose taken on 2/25 am per review with pt. CBC stable.   Prior to arrival dose:  1 mg on Friday and 2 mg AOD's   Goal of Therapy:  INR 2-3 Monitor platelets by anticoagulation protocol: Yes   Plan:  1. No coumadin this evening as took 2/25 dose earlier today  2. Daily INR  Vincenza Hews, PharmD, BCPS 11/01/2016, 6:45 PM

## 2016-11-01 NOTE — ED Notes (Signed)
Pt taken to and from RR via [facility] 

## 2016-11-01 NOTE — ED Triage Notes (Signed)
Pt presents to ED via EMS with complaints of generalized body aches . Pt states she was taking antibiotics for leg ulcer and has been feeling bad ever since she started taking antibiotics but finished them 4 days ago.

## 2016-11-02 DIAGNOSIS — M791 Myalgia: Secondary | ICD-10-CM

## 2016-11-02 LAB — CBC
HCT: 35.7 % — ABNORMAL LOW (ref 36.0–46.0)
HEMOGLOBIN: 11.7 g/dL — AB (ref 12.0–15.0)
MCH: 27.9 pg (ref 26.0–34.0)
MCHC: 32.8 g/dL (ref 30.0–36.0)
MCV: 85 fL (ref 78.0–100.0)
PLATELETS: 395 10*3/uL (ref 150–400)
RBC: 4.2 MIL/uL (ref 3.87–5.11)
RDW: 14.4 % (ref 11.5–15.5)
WBC: 19.9 10*3/uL — AB (ref 4.0–10.5)

## 2016-11-02 LAB — COMPREHENSIVE METABOLIC PANEL
ALT: 52 U/L (ref 14–54)
ANION GAP: 5 (ref 5–15)
AST: 68 U/L — ABNORMAL HIGH (ref 15–41)
Albumin: 1.8 g/dL — ABNORMAL LOW (ref 3.5–5.0)
Alkaline Phosphatase: 82 U/L (ref 38–126)
BUN: 7 mg/dL (ref 6–20)
CO2: 25 mmol/L (ref 22–32)
CREATININE: 0.6 mg/dL (ref 0.44–1.00)
Calcium: 7.3 mg/dL — ABNORMAL LOW (ref 8.9–10.3)
Chloride: 106 mmol/L (ref 101–111)
Glucose, Bld: 115 mg/dL — ABNORMAL HIGH (ref 65–99)
Potassium: 3.4 mmol/L — ABNORMAL LOW (ref 3.5–5.1)
SODIUM: 136 mmol/L (ref 135–145)
Total Bilirubin: 0.6 mg/dL (ref 0.3–1.2)
Total Protein: 4.8 g/dL — ABNORMAL LOW (ref 6.5–8.1)

## 2016-11-02 LAB — CK TOTAL AND CKMB (NOT AT ARMC)
CK TOTAL: 31 U/L — AB (ref 38–234)
CK, MB: 2.9 ng/mL (ref 0.5–5.0)
RELATIVE INDEX: INVALID (ref 0.0–2.5)

## 2016-11-02 LAB — PROTIME-INR
INR: 3.45
PROTHROMBIN TIME: 35.5 s — AB (ref 11.4–15.2)

## 2016-11-02 LAB — TSH: TSH: 1.25 u[IU]/mL (ref 0.350–4.500)

## 2016-11-02 LAB — URINE CULTURE

## 2016-11-02 LAB — LACTIC ACID, PLASMA: Lactic Acid, Venous: 1.4 mmol/L (ref 0.5–1.9)

## 2016-11-02 LAB — SEDIMENTATION RATE: Sed Rate: 42 mm/hr — ABNORMAL HIGH (ref 0–22)

## 2016-11-02 NOTE — Evaluation (Signed)
Physical Therapy Evaluation Patient Details Name: Vanessa Page MRN: [id number] DOB: 1931/09/05 Today's Date: 11/02/2016   History of Present Illness  Mykeia Wingerter is an 83 y.o. female with a history of mitral regurgitation, factor C deficiency, deep vein thrombosis on anticoagulation, and aortic regurgitation, with complaint of progressive generalized weakness over the past 4-5 days with increasing difficulty with being able to stand and walk as well as sit up in bed without assistance. She was found to have urinary tract infection as well as elevated WBC count of 22,000.   Clinical Impression  Pt admitted with above diagnosis. Pt currently with functional limitations due to the deficits listed below (see PT Problem List). Pt able to ambulate with RW with min assist.  Pt should progress well. Just a little bit of unsteady gait.  Will have husband at home to A.  Pt will benefit from skilled PT to increase their independence and safety with mobility to allow discharge to the venue listed below.      Follow Up Recommendations Home health PT;Supervision/Assistance - 24 hour    Equipment Recommendations  Rolling walker with 5" wheels    Recommendations for Other Services       Precautions / Restrictions Precautions Precautions: Fall Restrictions Weight Bearing Restrictions: No      Mobility  Bed Mobility Overal bed mobility: Needs Assistance Bed Mobility: Supine to Sit     Supine to sit: Min assist     General bed mobility comments: assist for moving LEs and elevation of trunk  Transfers Overall transfer level: Needs assistance Equipment used: Rolling walker (2 wheeled) Transfers: Sit to/from Stand Sit to Stand: Min assist;+2 safety/equipment         General transfer comment: Needed cues for hand placement.  Needed cues for sequencing Rw and steps.  Ambulated to bathroom and pt used toilet.  Min assist with cues using grab bars.    Ambulation/Gait Ambulation/Gait  assistance: Min assist;+2 safety/equipment Ambulation Distance (Feet): 40 Feet (20 feet x 2. ) Assistive device: Rolling walker (2 wheeled) Gait Pattern/deviations: Step-through pattern;Decreased stride length;Wide base of support;Trunk flexed;Drifts right/left   Gait velocity interpretation: Below normal speed for age/gender General Gait Details: Pt was able to ambulate with cues and min assist as pt unsteady at times.  Does best with RW  Stairs            Wheelchair Mobility    Modified Rankin (Stroke Patients Only)       Balance Overall balance assessment: Needs assistance;History of Falls Sitting-balance support: No upper extremity supported;Feet supported Sitting balance-Leahy Scale: Fair     Standing balance support: Bilateral upper extremity supported;During functional activity Standing balance-Leahy Scale: Poor Standing balance comment: relies on UEs.  Washed hands at sink with 1 LOB backwards at sink needing min assist to steady.                             Pertinent Vitals/Pain Pain Assessment: 0-10 Pain Score: 8  Pain Location: right LE with movement Pain Descriptors / Indicators: Aching;Grimacing;Guarding Pain Intervention(s): Limited activity within patient's tolerance;Monitored during session;Repositioned  VSS  Home Living Family/patient expects to be discharged to:: Private residence Living Arrangements: Spouse/significant other Available Help at Discharge: Available 24 hours/day Type of Home: Independent living facility Home Access: Level entry     Home Layout: One level Home Equipment: Cane - single point;Bedside commode;Grab bars - toilet;Hand held shower head      Prior  Function Level of Independence: Independent;Needs assistance   Gait / Transfers Assistance Needed: LAst two weeks, furniture walker and cane as well as husband  ADL's / Homemaking Assistance Needed: assist last two weeks        Hand Dominance         Extremity/Trunk Assessment   Upper Extremity Assessment Upper Extremity Assessment: Defer to OT evaluation    Lower Extremity Assessment Lower Extremity Assessment: RLE deficits/detail;LLE deficits/detail RLE Deficits / Details: grossly 3/5 LLE Deficits / Details: grossly 3/5    Cervical / Trunk Assessment Cervical / Trunk Assessment: Kyphotic  Communication   Communication: No difficulties  Cognition Arousal/Alertness: Awake/alert Behavior During Therapy: WFL for tasks assessed/performed Overall Cognitive Status: Within Functional Limits for tasks assessed                      General Comments      Exercises General Exercises - Lower Extremity Ankle Circles/Pumps: AROM;Both;10 reps;Seated Long Arc Quad: AROM;Both;10 reps;Seated   Assessment/Plan    PT Assessment Patient needs continued PT services  PT Problem List Decreased strength;Decreased activity tolerance;Decreased balance;Decreased mobility;Decreased knowledge of use of DME;Decreased safety awareness;Decreased knowledge of precautions;Pain       PT Treatment Interventions DME instruction;Gait training;Functional mobility training;Therapeutic activities;Therapeutic exercise;Balance training;Patient/family education    PT Goals (Current goals can be found in the Care Plan section)  Acute Rehab PT Goals Patient Stated Goal: to go home PT Goal Formulation: With patient Time For Goal Achievement: 11/16/16 Potential to Achieve Goals: Good    Frequency Min 3X/week   Barriers to discharge        Co-evaluation               End of Session Equipment Utilized During Treatment: Gait belt Activity Tolerance: Patient limited by fatigue Patient left: in chair;with call bell/phone within reach;with family/visitor present Nurse Communication: Mobility status PT Visit Diagnosis: Unsteadiness on feet (R26.81);History of falling (Z91.81);Muscle weakness (generalized) (M62.81);Pain Pain - Right/Left:  Right Pain - part of body: Hip         Time: [id number] PT Time Calculation (min) (ACUTE ONLY): 25 min   Charges:   PT Evaluation $PT Eval Moderate Complexity: 1 Procedure PT Treatments $Gait Training: 8-22 mins   PT G Codes:         Denice Paradise 15-Nov-2016, 3:54 PM [facility] [phone] (pager)

## 2016-11-02 NOTE — Consult Note (Signed)
[place] Nurse wound consult note Reason for Consult: RLE wound, present on admission  Patient followed by primary care MD for this. Reported history of venous dx. Has orders for compression hose that she has been wearing for a few weeks (8-49mmHg). She reports other compression hose in the past but was not compliant with them. Wound type: full thickness ulceration, right medial malleolar  Pressure Injury POA: No Measurement:0.3cm x 0.3xm 0.1cm  Wound [id number], moist Drainage (amount, consistency, odor) scant Periwound: firm around wound, unknown if this is chronic or not, no significant erythema  Dressing procedure/placement/frequency: continue soft silicone foam dressing. Patient to follow up with primary care MD upon her return to her home in [place].  Discussed POC with patient and bedside nurse.  Re consult if needed, will not follow at this time. Thanks  Beckem Tomberlin [employer], RN,CWOCN, CNS [phone])

## 2016-11-02 NOTE — Progress Notes (Addendum)
ANTICOAGULATION CONSULT NOTE - FOLLOW UP  Pharmacy Consult:  Coumadin  Indication:  History of DVT and Protein C deficiency  Allergies  Allergen Reactions  . Codeine Other (See Comments)    Unknown reaction  . Other Other (See Comments)    Patient has an intolerance to Augmentin,Zithromax,Bioxin,Avalox, & Erythromycin. She can not take them unless they are needed for Bronchiectasis    Patient Measurements: Height: 5' (152.4 cm) Weight: 127 lb (57.6 kg) IBW/kg (Calculated) : 45.5  Vital Signs: Temp: 98 F (36.7 C) (02/26 0349) Temp Source: Oral (02/26 0349) BP: 119/55 (02/26 0349) Pulse Rate: 72 (02/26 0349)  Labs:  Recent Labs  11/01/16 0740 11/01/16 1115 11/02/16 0237 11/02/16 1227  HGB 13.7  --  11.7*  --   HCT 40.1  --  35.7*  --   PLT 484*  --  395  --   LABPROT  --  26.8*  --  35.5*  INR  --  2.42  --  3.45  CREATININE 0.70  --  0.60  --   CKTOTAL 24*  --  31*  --   CKMB  --   --  2.9  --   TROPONINI <0.03  --   --   --     Estimated Creatinine Clearance: 42.3 mL/min (by C-G formula based on SCr of 0.6 mg/dL).    Assessment: 49 YOF with history of DVT and Protein C deficiency to continue on Coumadin from PTA.  Patient's INR increase significantly to supra-therapeutic level today.  No bleeding reported.  Her PO intake is reduced and her AST is elevated.  Prior to arrival dose:  1 mg on Friday and 2 mg AOD's    Goal of Therapy:  INR 2-3    Plan:  - Hold Coumadin today - Daily PT / INR    Ahtziry Saathoff D. Mina Marble, PharmD, BCPS Pager:  [phone] 11/02/2016, 1:44 PM

## 2016-11-02 NOTE — Discharge Instructions (Signed)
Information on my medicine - Coumadin   (Warfarin)  This medication education was reviewed with me or my healthcare representative as part of my discharge preparation.  The pharmacist that spoke with me during my hospital stay was:  Saundra Shelling, [facility]  Why was Coumadin prescribed for you? Coumadin was prescribed for you because you have a blood clot or a medical condition that can cause an increased risk of forming blood clots. Blood clots can cause serious health problems by blocking the flow of blood to the heart, lung, or brain. Coumadin can prevent harmful blood clots from forming. As a reminder your indication for Coumadin is:   Blood Clotting Disorder Protein C deficiency  What test will check on my response to Coumadin? While on Coumadin (warfarin) you will need to have an INR test regularly to ensure that your dose is keeping you in the desired range. The INR (international normalized ratio) number is calculated from the result of the laboratory test called prothrombin time (PT).  If an INR APPOINTMENT HAS NOT ALREADY BEEN MADE FOR YOU please schedule an appointment to have this lab work done by your health care provider within 7 days. Your INR goal is usually a number between:  2 to 3 or your provider may give you a more narrow range like 2-2.5.  Ask your health care provider during an office visit what your goal INR is.  What  do you need to  know  About  COUMADIN? Take Coumadin (warfarin) exactly as prescribed by your healthcare provider about the same time each day.  DO NOT stop taking without talking to the doctor who prescribed the medication.  Stopping without other blood clot prevention medication to take the place of Coumadin may increase your risk of developing a new clot or stroke.  Get refills before you run out.  What do you do if you miss a dose? If you miss a dose, take it as soon as you remember on the same day then continue your regularly scheduled regimen the next day.   Do not take two doses of Coumadin at the same time.  Important Safety Information A possible side effect of Coumadin (Warfarin) is an increased risk of bleeding. You should call your healthcare provider right away if you experience any of the following: ? Bleeding from an injury or your nose that does not stop. ? Unusual colored urine (red or dark brown) or unusual colored stools (red or black). ? Unusual bruising for unknown reasons. ? A serious fall or if you hit your head (even if there is no bleeding).  Some foods or medicines interact with Coumadin (warfarin) and might alter your response to warfarin. To help avoid this: ? Eat a balanced diet, maintaining a consistent amount of Vitamin K. ? Notify your provider about major diet changes you plan to make. ? Avoid alcohol or limit your intake to 1 drink for women and 2 drinks for men per day. (1 drink is 5 oz. wine, 12 oz. beer, or 1.5 oz. liquor.)  Make sure that ANY health care provider who prescribes medication for you knows that you are taking Coumadin (warfarin).  Also make sure the healthcare provider who is monitoring your Coumadin knows when you have started a new medication including herbals and non-prescription products.  Coumadin (Warfarin)  Major Drug Interactions  Increased Warfarin Effect Decreased Warfarin Effect  Alcohol (large quantities) Antibiotics (esp. Septra/Bactrim, Flagyl, Cipro) Amiodarone (Cordarone) Aspirin (ASA) Cimetidine (Tagamet) Megestrol (Megace) NSAIDs (ibuprofen,  naproxen, etc.) °Piroxicam (Feldene) °Propafenone (Rythmol SR) °Propranolol (Inderal) °Isoniazid (INH) °Posaconazole (Noxafil) Barbiturates (Phenobarbital) °Carbamazepine (Tegretol) °Chlordiazepoxide (Librium) °Cholestyramine (Questran) °Griseofulvin °Oral Contraceptives °Rifampin °Sucralfate (Carafate) °Vitamin K  ° °Coumadin® (Warfarin) Major Herbal Interactions  °Increased Warfarin Effect Decreased Warfarin Effect  °Garlic °Ginseng °Ginkgo  biloba Coenzyme Q10 °Green tea °St. John’s wort   ° °Coumadin® (Warfarin) FOOD Interactions  °Eat a consistent number of servings per week of foods HIGH in Vitamin K °(1 serving = ½ cup)  °Collards (cooked, or boiled & drained) °Kale (cooked, or boiled & drained) °Mustard greens (cooked, or boiled & drained) °Parsley *serving size only = ¼ cup °Spinach (cooked, or boiled & drained) °Swiss chard (cooked, or boiled & drained) °Turnip greens (cooked, or boiled & drained)  °Eat a consistent number of servings per week of foods MEDIUM-HIGH in Vitamin K °(1 serving = 1 cup)  °Asparagus (cooked, or boiled & drained) °Broccoli (cooked, boiled & drained, or raw & chopped) °Brussel sprouts (cooked, or boiled & drained) *serving size only = ½ cup °Lettuce, raw (green leaf, endive, romaine) °Spinach, raw °Turnip greens, raw & chopped  ° °These websites have more information on Coumadin (warfarin):  www.coumadin.com; °www.ahrq.gov/consumer/coumadin.htm; ° ° ° °

## 2016-11-02 NOTE — Progress Notes (Signed)
[facility] Progress Note  Patient: Vanessa Page [id number]   PCP: Pcp Not In System DOB: [birth year]   DOA: 11/01/2016   DOS: 11/02/2016   Date of Service: the patient was seen and examined on 11/02/2016   Subjective: Feeling better this morning continues to have weakness. No nausea no vomiting. No diarrhea.  Brief hospital course: Pt. with PMH of bronchiectasis, DVT, chronic venous insufficiency; admitted on 11/01/2016, with complaint of generalized fatigue and myalgia after taking Bactrim, was found to have UTI as well as serum sickness. Currently further plan is continue IV ceftriaxone.  Assessment and Plan: 1. Myalgia. The symptoms started after the patient was on Bactrim, progressively worsened over the week. She did not have any associated rash. Extensive evaluation so far appears negative. Patient is actually feeling better. This appears possible serum sickness like presentation in the setting of Bactrim use. Patient should get better on its own. I do not suspect that the patient requires any steroids at present. Neurology was consulted, they do not suspect the patient has any ongoing GPS or myasthenia-like symptoms. PTOT recommends home health.  2. UTI. Patient had leukocytosis in the ER currently getting better. Urine did show WBC. Currently culture is not growing any significant amount of bacteria. Patient is clinically getting better on IV ceftriaxone. Finish course of oral Keflex for total 7 days if the blood cultures are negative.  3. History of DVT. Coumadin per pharmacy. INR therapeutic.  Bowel regimen: last BM 11/02/2016 Diet: Cardiac diet DVT Prophylaxis: on therapeutic anticoagulation.  Advance goals of care discussion: Full code  Family Communication: family was present at bedside, at the time of interview. The pt provided permission to discuss medical plan with the family. Opportunity was given to ask question and all questions were answered  satisfactorily.   Disposition:  Discharge to home. Expected discharge date: 11/03/2016,   Consultants: Neurology Procedures: None  Antibiotics: Anti-infectives    Start     Dose/Rate Route Frequency Ordered Stop   11/02/16 1200  cefTRIAXone (ROCEPHIN) 1 g in dextrose 5 % 50 mL IVPB     1 g 100 mL/hr over 30 Minutes Intravenous Every 24 hours 11/01/16 1544     11/02/16 1100  vancomycin (VANCOCIN) IVPB 750 mg/150 ml premix  Status:  Discontinued     750 mg 150 mL/hr over 60 Minutes Intravenous Every 24 hours 11/01/16 1554 11/02/16 0744   11/01/16 1145  cefTRIAXone (ROCEPHIN) 1 g in dextrose 5 % 50 mL IVPB     1 g 100 mL/hr over 30 Minutes Intravenous  Once 11/01/16 1138 11/01/16 1240   11/01/16 1145  vancomycin (VANCOCIN) IVPB 1000 mg/200 mL premix     1,000 mg 200 mL/hr over 60 Minutes Intravenous  Once 11/01/16 1138 11/01/16 1406        Objective: Physical Exam: Vitals:   11/01/16 2012 11/01/16 2300 11/02/16 0242 11/02/16 0349  BP: 124/63   (!) 119/55  Pulse: 86   72  Resp: 18 16  16   Temp: 98.3 F (36.8 C)   98 F (36.7 C)  TempSrc: Oral   Oral  SpO2: 97%   100%  Weight:   57.6 kg (127 lb)   Height:        Intake/Output Summary (Last 24 hours) at 11/02/16 1730 Last data filed at 11/02/16 1600  Gross per 24 hour  Intake          2566.25 ml  Output  950 ml  Net          1616.25 ml   Filed Weights   11/01/16 0704 11/02/16 0242  Weight: 56.2 kg (124 lb) 57.6 kg (127 lb)    General: Alert, Awake and Oriented to Time, Place and Person. Appear in mild distress, affect appropriate Eyes: PERRL, Conjunctiva normal ENT: Oral Mucosa clear moist. Neck: no JVD, no Abnormal Mass Or lumps Cardiovascular: S1 and S2 Present, no Murmur, Respiratory: Bilateral Air entry equal and Decreased, no use of accessory muscle, Clear to Auscultation, no Crackles, no wheezes Abdomen: Bowel Sound present, Soft and no tenderness Skin: no redness, no Rash, no  induration Extremities: no Pedal edema, no calf tenderness Neurologic: Grossly no focal neuro deficit. Bilaterally Equal motor strength  Data Reviewed: CBC:  Recent Labs Lab 11/01/16 0740 11/02/16 0237  WBC 22.3* 19.9*  NEUTROABS 18.7*  --   HGB 13.7 11.7*  HCT 40.1 35.7*  MCV 83.4 85.0  PLT 484* [id number]   Basic Metabolic Panel:  Recent Labs Lab 11/01/16 0740 11/02/16 0237  NA 132* 136  K 3.8 3.4*  CL 98* 106  CO2 25 25  GLUCOSE 118* 115*  BUN 12 7  CREATININE 0.70 0.60  CALCIUM 8.1* 7.3*    Liver Function Tests:  Recent Labs Lab 11/01/16 0740 11/02/16 0237  AST 53* 68*  ALT 43 52  ALKPHOS 96 82  BILITOT 0.9 0.6  PROT 6.2* 4.8*  ALBUMIN 2.5* 1.8*    Recent Labs Lab 11/01/16 0740  LIPASE 20   No results for input(s): AMMONIA in the last 168 hours. Coagulation Profile:  Recent Labs Lab 11/01/16 1115 11/02/16 1227  INR 2.42 3.45   Cardiac Enzymes:  Recent Labs Lab 11/01/16 0740 11/02/16 0237  CKTOTAL 24* 31*  CKMB  --  2.9  TROPONINI <0.03  --    BNP (last 3 results) No results for input(s): PROBNP in the last 8760 hours.  CBG: No results for input(s): GLUCAP in the last 168 hours.  Studies: Mr Brain Wo Contrast  Result Date: 11/01/2016 CLINICAL DATA:  Weakness EXAM: MRI HEAD WITHOUT CONTRAST TECHNIQUE: Multiplanar, multiecho pulse sequences of the brain and surrounding structures were obtained without intravenous contrast. COMPARISON:  None. FINDINGS: Brain: Moderate atrophy. Ventricular enlargement consistent with atrophy. Negative for acute infarct. Hyperintensity in the cerebral white matter bilaterally. Hyperintensity in the left thalamus. These areas are most likely due to chronic ischemia. Negative for hemorrhage or mass. No shift of the midline structures. Vascular: Normal arterial flow voids. Skull and upper cervical spine: Negative Sinuses/Orbits: Paranasal sinuses clear. Bilateral mastoid effusion. Normal orbit. Other: None  IMPRESSION: Atrophy and moderate chronic ischemic change.  No acute abnormality Bilateral mastoid sinus effusion. Electronically Signed   By: Franchot Gallo M.D.   On: 11/01/2016 18:48     Scheduled Meds: . cefTRIAXone (ROCEPHIN)  IV  1 g Intravenous Q24H  . conjugated estrogens  1 Applicatorful Vaginal Once per day on Mon Thu  . diclofenac sodium  2 g Topical QID  . fluticasone  2 spray Each Nare Daily  . polyethylene glycol  17 g Oral Daily  . senna-docusate  2 tablet Oral BID  . Warfarin - Pharmacist Dosing Inpatient   Does not apply q1800   Continuous Infusions: PRN Meds: acetaminophen **OR** acetaminophen, albuterol, azelastine, traMADol  Time spent: 30 minutes  Author: Berle Mull, MD [facility] Pager: [phone] 11/02/2016 5:30 PM  If 7PM-7AM, please contact night-coverage at [email or web address], password [email or web address]

## 2016-11-02 NOTE — Progress Notes (Signed)
Subjective: Patient is feeling much improved ut still weak and will need PT  Exam: Vitals:   11/01/16 2300 11/02/16 0349  BP:  (!) 119/55  Pulse:  72  Resp: 16 16  Temp:  98 F (36.7 C)        Gen: In bed, NAD MS: alert and oriented with many questions [id number] nerves II through XII grossly intact Motor: 4/5 strength throughout with some discomfort with proximal upper extremities and proximal hip flexors. Patient is continuously dorsiflexing and plantar flexing her feet to get stronger. Sensory: Within normal limits   Pertinent Labs/Diagnostics: None    Impression: This is an 83 year old female with generalized weakness in the setting of urinary tract infection. Influenza panel was negative. As noted acute myositis is unlikely due to low CK and GBS is unlikely secondary to lower extremity reflexes. Patient is feeling much better with antibiotics and fluids. Only recommendation at this time is to continue with physical therapy. Neurology will sign off thank you very much  Etta Quill PA-C [facility] [phone]     11/02/2016, 11:09 AM

## 2016-11-03 LAB — BASIC METABOLIC PANEL
ANION GAP: 11 (ref 5–15)
BUN: 7 mg/dL (ref 6–20)
CALCIUM: 7.7 mg/dL — AB (ref 8.9–10.3)
CHLORIDE: 98 mmol/L — AB (ref 101–111)
CO2: 25 mmol/L (ref 22–32)
Creatinine, Ser: 0.6 mg/dL (ref 0.44–1.00)
GFR calc Af Amer: >60 mL/min (ref >60)
GFR calc non Af Amer: >60 mL/min (ref >60)
GLUCOSE: 112 mg/dL — AB (ref 65–99)
Potassium: 3.1 mmol/L — ABNORMAL LOW (ref 3.5–5.1)
Sodium: 134 mmol/L — ABNORMAL LOW (ref 135–145)

## 2016-11-03 LAB — CBC
HEMATOCRIT: 37.4 % (ref 36.0–46.0)
HEMOGLOBIN: 12.4 g/dL (ref 12.0–15.0)
MCH: 27.9 pg (ref 26.0–34.0)
MCHC: 33.2 g/dL (ref 30.0–36.0)
MCV: 84.2 fL (ref 78.0–100.0)
Platelets: 478 10*3/uL — ABNORMAL HIGH (ref 150–400)
RBC: 4.44 MIL/uL (ref 3.87–5.11)
RDW: 14.4 % (ref 11.5–15.5)
WBC: 19.1 10*3/uL — AB (ref 4.0–10.5)

## 2016-11-03 LAB — ANTINUCLEAR ANTIBODIES, IFA: ANA Ab, IFA: NEGATIVE

## 2016-11-03 LAB — PROTIME-INR
INR: 2.31
Prothrombin Time: 25.8 seconds — ABNORMAL HIGH (ref 11.4–15.2)

## 2016-11-03 MED ORDER — SACCHAROMYCES BOULARDII 250 MG PO CAPS: 250.0000 mg | ORAL_CAPSULE | Freq: Two times a day (BID) | ORAL | 0 refills | Status: DC

## 2016-11-03 MED ORDER — ENSURE ENLIVE PO LIQD: 237.0000 mL | Freq: Two times a day (BID) | ORAL | Status: DC

## 2016-11-03 MED ORDER — POLYETHYLENE GLYCOL 3350 17 G PO PACK: 17.0000 g | PACK | Freq: Every day | ORAL | 0 refills | Status: AC | PRN

## 2016-11-03 MED ORDER — WARFARIN SODIUM 2 MG PO TABS
2.0000 mg | ORAL_TABLET | Freq: Once | ORAL | Status: DC
Start: 2016-11-03 — End: 2016-11-03

## 2016-11-03 MED ORDER — CEPHALEXIN 500 MG PO CAPS: 500.0000 mg | ORAL_CAPSULE | Freq: Two times a day (BID) | ORAL | 0 refills | Status: AC

## 2016-11-03 MED ORDER — POTASSIUM CHLORIDE CRYS ER 20 MEQ PO TBCR
40.0000 meq | EXTENDED_RELEASE_TABLET | ORAL | Status: AC
  Administered 2016-11-03: 40 meq via ORAL
  Filled 2016-11-03: qty 2

## 2016-11-03 MED ORDER — CEPHALEXIN 500 MG PO CAPS
500.0000 mg | ORAL_CAPSULE | Freq: Two times a day (BID) | ORAL | Status: DC
  Administered 2016-11-03: 500 mg via ORAL
  Filled 2016-11-03: qty 1

## 2016-11-03 MED ORDER — WARFARIN SODIUM 1 MG PO TABS
1.0000 mg | ORAL_TABLET | Freq: Once | ORAL | Status: DC
  Filled 2016-11-03: qty 1

## 2016-11-03 MED ORDER — ENSURE ENLIVE PO LIQD: 237.0000 mL | Freq: Two times a day (BID) | ORAL | 12 refills | Status: DC

## 2016-11-03 NOTE — Care Management (Signed)
Patient's prescriptions were electronically sent to her [facility] pharmacy in [place]. Case manager contacted her pharmacy there: [phone] and asked how we could have them sent to a [facility] in [place]. CM contacted the [facility] on [address] and spoke with pharmacist, she was able to access their system and will fill prescriptions for patient. Case manager provided Mr. Heward with directions to the [facility] at [address]. Ricki Miller, RN BSN Case Manager

## 2016-11-03 NOTE — Care Management Note (Addendum)
Case Management Note  Patient Details  Name: Daleen Lohrke MRN: [id number] Date of Birth: 08-29-1931  Subjective/Objective:    83 yr old female admitted with nausea and vomiting, generalized weakness.                Action/Plan:  Case manager spoke with patient and her husband concerning need for Home Health physical therapy. Husband states they plan to return home to [place] as soon as patient is able to travel, they would appreciate [facility] for a week or so. Choice was offered for [place]. Referral was called to Stevie Kern, [place] Liaison. Patient states they will be staying with their son at [place]. [place] [facility]. Husband's cell is [phone]. Patient has a rolling walker and will have family support at discharge. CM will also fax Home Health orders to the independent living facility in [place] so they will be able to continue [facility] therapy. Patient will also need HHRN for INR monitoring. Patient is followed by [facility] in [place]. Fax: [phone] [id number]. Patient's cardiologist is Dr. Kevan Ny II. Case manager provided this information to Stevie Kern, [place] Liaison.   Case manager will fax home health orders to patient's retirement community rehab contact: Ernestina Columbia  Fax:  [phone]  Ph. [phone] so they may provide HHPT when patient returns to [place]. Case manager received all contact information for Dr and for [place] from patient and her husband.   Expected Discharge Date:   11/03/16               Expected Discharge Plan:  [place]  In-House Referral:  NA  Discharge planning Services  CM Consult  Post Acute Care Choice:  Home Health Choice offered to:  Patient, Spouse  DME Arranged:  3in1 DME Agency:  [facility] Arranged:  PT [place] Agency:  [place]  Status of Service:  Completed, signed off  If discussed at [place] of  Stay Meetings, dates discussed:    Additional Comments:  Ninfa Meeker, RN 11/03/2016, 11:29 AM

## 2016-11-03 NOTE — Discharge Summary (Signed)
[facility] Discharge Summary   Patient: Vanessa Page [id number]   PCP: Pcp Not In System DOB: 25-Nov-1930   Date of admission: 11/01/2016   Date of discharge:  11/03/2016    Discharge Diagnoses:  Active Problems:   History of DVT of lower extremity   Myalgia   Leukocytosis   UTI (urinary tract infection)   Hyponatremia   Weakness   Admitted From: home Disposition:  Home with home health, will also need continued home PT at the community that pt lives in [place].  Recommendations for Outpatient Follow-up:  1. Follow up with PCP in 1 -2 week   Follow-up Information    [facility] Follow up.   Why:  Someone from [facility] will contact you to arrange start date and time for therapy. Contact information: [address] [place] [address] [phone]        PCP. Schedule an appointment as soon as possible for a visit in 2 week(s).          Diet recommendation: cardiac diet  Activity: The patient is advised to gradually reintroduce usual activities.  Discharge Condition: good  Code Status: full code  History of present illness: As per the H and P dictated on admission, " Vanessa Page  is a 83 y.o. female, w bronchiectasis, DVT (right), factor C def, apparently presents w/ weakness since Friday.  Weakness is generalized.  Pt denies back pain.  Pt couldn't get up out of chair. Pt denies fever, chills, cp, palp, sob.   Pt states that she has incontinence of urine.  Pt notes that she was on Bactrim recently and attributes her symptoms of weakness, and myalgia to the antibiotic.  Pt denies any viral symptoms. Pt was weaker on Saturday so she was brought in today to [place] for evaluation. "  Hospital Course:   Summary of her active problems in the hospital is as following. 1. Myalgia. The symptoms started after the patient was on Bactrim, progressively worsened over the week. She did not have any associated  rash. Extensive evaluation so far appears negative. Patient is actually feeling better. This appears possible serum sickness like presentation in the setting of Bactrim use. Myalgia should get better on its own. I do not suspect that the patient requires any steroids at present. Neurology was consulted, they do not suspect the patient has any ongoing GBS or myasthenia-like symptoms. PTOT recommends home health. Arranged by case manager, pt will need continued therapy at the community center that they belong in [place]   2. UTI. Patient had leukocytosis in the ER currently getting better. Urine did show WBC. Currently culture is not growing any significant amount of bacteria. Patient is clinically getting better on IV ceftriaxone. Finish course of oral Keflex  3. History of DVT. INR therapeutic. Continue home regimen.  All other chronic medical condition were stable during the hospitalization.  Patient was seen by physical therapy, who recommended home health, which was arranged by Education officer, museum and case Freight forwarder. On the day of the discharge the patient's vitals were stable, and no other acute medical condition were reported by patient. the patient was felt safe to be discharge at home with home health.  Procedures and Results:  none   Consultations:  Neurology  DISCHARGE MEDICATION: Current Discharge Medication List    START taking these medications   Details  cephALEXin (KEFLEX) 500 MG capsule Take 1 capsule (500 mg total) by mouth 2 (two) times daily.  Qty: 12 capsule, Refills: 0    feeding supplement, ENSURE ENLIVE, (ENSURE ENLIVE) LIQD Take 237 mLs by mouth 2 (two) times daily between meals. Qty: 237 mL, Refills: 12    saccharomyces boulardii (FLORASTOR) 250 MG capsule Take 1 capsule (250 mg total) by mouth 2 (two) times daily. Qty: 12 capsule, Refills: 0      CONTINUE these medications which have CHANGED   Details  polyethylene glycol (MIRALAX / GLYCOLAX)  packet Take 17 g by mouth daily as needed. Qty: 14 each, Refills: 0      CONTINUE these medications which have NOT CHANGED   Details  azelastine (ASTELIN) 137 MCG/SPRAY nasal spray Place 2 sprays into both nostrils 2 (two) times daily as needed for allergies. Use in each nostril as directed    conjugated estrogens (PREMARIN) vaginal cream Place 1 Applicatorful vaginally 2 (two) times a week.    denosumab (PROLIA) 60 MG/ML SOLN injection Inject 60 mg into the skin every 6 (six) months. Administer in upper arm, thigh, or abdomen    fluticasone (FLONASE) 50 MCG/ACT nasal spray Place into both nostrils daily.    Vitamin D, Ergocalciferol, (DRISDOL) 50000 units CAPS capsule Take 50,000 Units by mouth every 30 (thirty) days.    warfarin (COUMADIN) 1 MG tablet Take 1-2 mg by mouth daily at 6 PM. 1mg  daily except 2mg  on MWF      STOP taking these medications     diclofenac sodium (VOLTAREN) 1 % GEL      senna-docusate (SENOKOT-S) 8.6-50 MG per tablet      traMADol (ULTRAM) 50 MG tablet        Allergies  Allergen Reactions  . Codeine Other (See Comments)    Unknown reaction  . Other Other (See Comments)    Patient has an intolerance to Augmentin,Zithromax,Bioxin,Avalox, & Erythromycin. She can not take them unless they are needed for Bronchiectasis   Discharge Instructions    Diet - low sodium heart healthy    Complete by:  As directed    Discharge instructions    Complete by:  As directed    It is important that you read following instructions as well as go over your medication list with RN to help you understand your care after this hospitalization.  Discharge Instructions: Please follow-up with PCP in one week  Please request your primary care physician to go over all Hospital Tests and Procedure/Radiological results at the follow up,  Please get all Hospital records sent to your PCP by signing hospital release before you go home.   Do not take more than prescribed Pain,  Sleep and Anxiety Medications. You were cared for by a hospitalist during your hospital stay. If you have any questions about your discharge medications or the care you received while you were in the hospital after you are discharged, you can call the unit and ask to speak with the hospitalist on call if the hospitalist that took care of you is not available.  Once you are discharged, your primary care physician will handle any further medical issues. Please note that NO REFILLS for any discharge medications will be authorized once you are discharged, as it is imperative that you return to your primary care physician (or establish a relationship with a primary care physician if you do not have one) for your aftercare needs so that they can reassess your need for medications and monitor your lab values. You Must read complete instructions/literature along with all the possible adverse reactions/side effects for  all the Medicines you take and that have been prescribed to you. Take any new Medicines after you have completely understood and accept all the possible adverse reactions/side effects. Wear Seat belts while driving. If you have smoked or chewed Tobacco in the last 2 yrs please stop smoking and/or stop any Recreational drug use.   Increase activity slowly    Complete by:  As directed      Discharge Exam: Filed Weights   11/01/16 0704 11/02/16 0242  Weight: 56.2 kg (124 lb) 57.6 kg (127 lb)   Vitals:   11/02/16 2020 11/03/16 0506  BP: 120/67 131/62  Pulse: 76 81  Resp: 16 16  Temp: 98.1 F (36.7 C) 98.1 F (36.7 C)   General: Appear in mild distress, no Rash; Oral Mucosa moist. Cardiovascular: S1 and S2 Present, no Murmur, no JVD Respiratory: Bilateral Air entry present and Clear to Auscultation, no Crackles, no wheezes Abdomen: Bowel Sound present, Soft and no tenderness Extremities: no Pedal edema, no calf tenderness Neurology: Grossly no focal neuro deficit.  The results of  significant diagnostics from this hospitalization (including imaging, microbiology, ancillary and laboratory) are listed below for reference.    Significant Diagnostic Studies: Dg Chest 2 View  Result Date: 11/01/2016 CLINICAL DATA:  Generalized body aches. EXAM: CHEST  2 VIEW COMPARISON:  Chest x-ray 01/09/2014 FINDINGS: The heart size is upper limits of normal. Atherosclerotic changes are present at the aortic arch. Changes COPD are again seen. There is no edema or effusion. No focal airspace disease is present. IMPRESSION: 1. No acute cardiopulmonary disease. 2. Aortic atherosclerosis. 3. Changes of COPD. Electronically Signed   By: San Morelle M.D.   On: 11/01/2016 09:07   Mr Brain Wo Contrast  Result Date: 11/01/2016 CLINICAL DATA:  Weakness EXAM: MRI HEAD WITHOUT CONTRAST TECHNIQUE: Multiplanar, multiecho pulse sequences of the brain and surrounding structures were obtained without intravenous contrast. COMPARISON:  None. FINDINGS: Brain: Moderate atrophy. Ventricular enlargement consistent with atrophy. Negative for acute infarct. Hyperintensity in the cerebral white matter bilaterally. Hyperintensity in the left thalamus. These areas are most likely due to chronic ischemia. Negative for hemorrhage or mass. No shift of the midline structures. Vascular: Normal arterial flow voids. Skull and upper cervical spine: Negative Sinuses/Orbits: Paranasal sinuses clear. Bilateral mastoid effusion. Normal orbit. Other: None IMPRESSION: Atrophy and moderate chronic ischemic change.  No acute abnormality Bilateral mastoid sinus effusion. Electronically Signed   By: Franchot Gallo M.D.   On: 11/01/2016 18:48    Microbiology: Recent Results (from the past 240 hour(s))  Urine culture     Status: Abnormal   Collection Time: 11/01/16  7:44 AM  Result Value Ref Range Status   Specimen Description URINE, RANDOM  Final   Special Requests NONE  Final   Culture (A)  Final    <10,000 COLONIES/mL  INSIGNIFICANT GROWTH Performed at [facility], [address], [place], [place] [address]    Report Status 11/02/2016 FINAL  Final  Culture, blood (routine x 2)     Status: None (Preliminary result)   Collection Time: 11/01/16 11:00 AM  Result Value Ref Range Status   Specimen Description BLOOD RIGHT WRIST  Final   Special Requests BOTTLES DRAWN AEROBIC AND ANAEROBIC 4CC EACH  Final   Culture   Final    NO GROWTH 2 DAYS Performed at [facility], [place] [address] [place], [place] [address]    Report Status PENDING  Incomplete  Culture, blood (routine x 2)  Status: None (Preliminary result)   Collection Time: 11/01/16 11:15 AM  Result Value Ref Range Status   Specimen Description BLOOD LEFT WRIST  Final   Special Requests BOTTLES DRAWN AEROBIC AND ANAEROBIC 5CC EACH  Final   Culture   Final    NO GROWTH 2 DAYS Performed at [facility], [place] [address] [place], [place] [address]    Report Status PENDING  Incomplete     Labs: CBC:  Recent Labs Lab 11/01/16 0740 11/02/16 0237 11/03/16 0700  WBC 22.3* 19.9* 19.1*  NEUTROABS 18.7*  --   --   HGB 13.7 11.7* 12.4  HCT 40.1 35.7* 37.4  MCV 83.4 85.0 84.2  PLT 484* 395 123456*   Basic Metabolic Panel:  Recent Labs Lab 11/01/16 0740 11/02/16 0237 11/03/16 0700  NA 132* 136 134*  K 3.8 3.4* 3.1*  CL 98* 106 98*  CO2 25 25 25   GLUCOSE 118* 115* 112*  BUN 12 7 7   CREATININE 0.70 0.60 0.60  CALCIUM 8.1* 7.3* 7.7*   Liver Function Tests:  Recent Labs Lab 11/01/16 0740 11/02/16 0237  AST 53* 68*  ALT 43 52  ALKPHOS 96 82  BILITOT 0.9 0.6  PROT 6.2* 4.8*  ALBUMIN 2.5* 1.8*    Recent Labs Lab 11/01/16 0740  LIPASE 20   No results for input(s): AMMONIA in the last 168 hours. Cardiac Enzymes:  Recent Labs Lab 11/01/16 0740 11/02/16 0237  CKTOTAL 24* 31*  CKMB  --  2.9  TROPONINI <0.03  --    BNP (last 3 results) No results for input(s): BNP in the last 8760 hours. CBG: No results  for input(s): GLUCAP in the last 168 hours. Time spent: 30 minutes  Signed:  Berle Mull  [facility]  11/03/2016  , 12:08 PM

## 2016-11-03 NOTE — Progress Notes (Addendum)
ANTICOAGULATION CONSULT NOTE - FOLLOW UP  Pharmacy Consult:  Coumadin  Indication:  History of DVT and Protein C deficiency  Allergies  Allergen Reactions  . Codeine Other (See Comments)    Unknown reaction  . Other Other (See Comments)    Patient has an intolerance to Augmentin,Zithromax,Bioxin,Avalox, & Erythromycin. She can not take them unless they are needed for Bronchiectasis    Patient Measurements: Height: 5' (152.4 cm) Weight: 127 lb (57.6 kg) IBW/kg (Calculated) : 45.5  Vital Signs: Temp: 98.1 F (36.7 C) (02/27 0506) Temp Source: Oral (02/27 0506) BP: 131/62 (02/27 0506) Pulse Rate: 81 (02/27 0506)  Labs:  Recent Labs  11/01/16 0740 11/01/16 1115 11/02/16 0237 11/02/16 1227 11/03/16 0700  HGB 13.7  --  11.7*  --  12.4  HCT 40.1  --  35.7*  --  37.4  PLT 484*  --  395  --  478*  LABPROT  --  26.8*  --  35.5* 25.8*  INR  --  2.42  --  3.45 2.31  CREATININE 0.70  --  0.60  --  0.60  CKTOTAL 24*  --  31*  --   --   CKMB  --   --  2.9  --   --   TROPONINI <0.03  --   --   --   --     Estimated Creatinine Clearance: 42.3 mL/min (by C-G formula based on SCr of 0.6 mg/dL).    Assessment: 47 YOF with history of DVT and Protein C deficiency to continue on Coumadin from PTA.  Patient's INR decreased to therapeutic level today.  No bleeding reported.  Home dose (updated by husband):  1mg  daily except 2mg  on TTS   Goal of Therapy:  INR goal 1.5-2 per patient/husband    Plan:  - Coumadin 1mg  PO today - Daily PT / INR - F/U KCL repletion, abx LOT    Eliah Marquard D. Mina Marble, PharmD, BCPS Pager:  [phone] 11/03/2016, 9:15 AM

## 2016-11-03 NOTE — Progress Notes (Signed)
Physical Therapy Treatment Patient Details Name: Vanessa Page MRN: [id number] DOB: 1931/07/26 Today's Date: 11/03/2016    History of Present Illness Vanessa Page is an 83 y.o. female with a history of mitral regurgitation, factor C deficiency, deep vein thrombosis on anticoagulation, and aortic regurgitation, with complaint of progressive generalized weakness over the past 4-5 days with increasing difficulty with being able to stand and walk as well as sit up in bed without assistance. She was found to have urinary tract infection as well as elevated WBC count of 22,000.     PT Comments    Pt performed increased gait and reviewed steps in prep for d/c to her family's home in [place].  Pt reports she will receive HHPT before returning to [place]. Informed nursing that patient has numerous questions about HHPT, prescriptions and d/c.  Pt with d/c orders and nurse reviewing them post treatment.     Follow Up Recommendations  Home health PT;Supervision/Assistance - 24 hour     Equipment Recommendations  Rolling walker with 5" wheels    Recommendations for Other Services       Precautions / Restrictions Precautions Precautions: Fall Restrictions Weight Bearing Restrictions: No    Mobility  Bed Mobility               General bed mobility comments: pt standing in bathroom on arrival with assist from husband.    Transfers Overall transfer level: Needs assistance Equipment used: Rolling walker (2 wheeled) Transfers: Sit to/from Stand Sit to Stand: Supervision         General transfer comment: Cues for hand placement to push from seated surface.    Ambulation/Gait Ambulation/Gait assistance: Min guard Ambulation Distance (Feet): 100 Feet (x2 and 50 ft x2.  Required lengthy rest breaks inbetween trials.  ) Assistive device: Rolling walker (2 wheeled) Gait Pattern/deviations: Step-through pattern;Trunk flexed;Decreased stride length   Gait velocity  interpretation: Below normal speed for age/gender General Gait Details: Pt steady with RW but fatigues quickly and required x3 seated rest breaks.  Cues for upper trunk control and negotiating obstacles in halls.     Stairs Stairs: Yes   Stair Management: No rails Number of Stairs: 3 General stair comments: Pt held to therapists arms for support.  Cues for sequencing and min assist to maintain safety.    Wheelchair Mobility    Modified Rankin (Stroke Patients Only)       Balance Overall balance assessment: Needs assistance;History of Falls Sitting-balance support: No upper extremity supported;Feet supported Sitting balance-Leahy Scale: Good       Standing balance-Leahy Scale: Fair                      Cognition Arousal/Alertness: Awake/alert Behavior During Therapy: WFL for tasks assessed/performed Overall Cognitive Status: Within Functional Limits for tasks assessed                      Exercises      General Comments        Pertinent Vitals/Pain Pain Assessment: 0-10 Pain Score: 3  Pain Location: right LE with movement Pain Descriptors / Indicators: Tightness Pain Intervention(s): Monitored during session;Repositioned    Home Living                      Prior Function            PT Goals (current goals can now be found in the care plan section) Acute Rehab PT Goals  Patient Stated Goal: to go home Potential to Achieve Goals: Good Progress towards PT goals: Progressing toward goals    Frequency    Min 3X/week      PT Plan Current plan remains appropriate    Co-evaluation             End of Session Equipment Utilized During Treatment: Gait belt Activity Tolerance: Patient limited by fatigue Patient left: in chair;with call bell/phone within reach;with family/visitor present Nurse Communication: Mobility status PT Visit Diagnosis: Unsteadiness on feet (R26.81);History of falling (Z91.81);Muscle weakness  (generalized) (M62.81);Pain Pain - Right/Left: Right Pain - part of body: Hip     Time: [id number] PT Time Calculation (min) (ACUTE ONLY): 43 min  Charges:  $Gait Training: 23-37 mins $Therapeutic Activity: 8-22 mins                    G Codes:       Cristela Blue 2016-11-22, 3:28 PM Governor Rooks, PTA pager [phone]

## 2016-11-06 LAB — CULTURE, BLOOD (ROUTINE X 2)
CULTURE: NO GROWTH
Culture: NO GROWTH

## 2016-11-09 LAB — ACETYLCHOLINE RECEPTOR AB, ALL
ACETYLCHOL BLOCK AB: 21 % (ref 0–25)
Acety choline binding ab: <0.03 nmol/L (ref 0.00–0.24)

## 2016-11-16 ENCOUNTER — Emergency Department (HOSPITAL_COMMUNITY): Payer: Medicare Other

## 2016-11-16 ENCOUNTER — Encounter (HOSPITAL_COMMUNITY): Payer: Self-pay

## 2016-11-16 ENCOUNTER — Inpatient Hospital Stay (HOSPITAL_COMMUNITY)
Admission: EM | Admit: 2016-11-16 | Discharge: 2016-11-21 | DRG: 546 | Disposition: A | Discharge status: Home / Self Care | Payer: Medicare Other | Attending: Internal Medicine | Admitting: Internal Medicine

## 2016-11-16 DIAGNOSIS — R609 Edema, unspecified: Secondary | ICD-10-CM | POA: Diagnosis not present

## 2016-11-16 DIAGNOSIS — Z993 Dependence on wheelchair: Secondary | ICD-10-CM

## 2016-11-16 DIAGNOSIS — J479 Bronchiectasis, uncomplicated: Secondary | ICD-10-CM | POA: Diagnosis present

## 2016-11-16 DIAGNOSIS — E871 Hypo-osmolality and hyponatremia: Secondary | ICD-10-CM | POA: Diagnosis present

## 2016-11-16 DIAGNOSIS — Z9889 Other specified postprocedural states: Secondary | ICD-10-CM | POA: Diagnosis not present

## 2016-11-16 DIAGNOSIS — I10 Essential (primary) hypertension: Secondary | ICD-10-CM

## 2016-11-16 DIAGNOSIS — R197 Diarrhea, unspecified: Secondary | ICD-10-CM | POA: Diagnosis present

## 2016-11-16 DIAGNOSIS — R700 Elevated erythrocyte sedimentation rate: Secondary | ICD-10-CM | POA: Diagnosis present

## 2016-11-16 DIAGNOSIS — Z809 Family history of malignant neoplasm, unspecified: Secondary | ICD-10-CM

## 2016-11-16 DIAGNOSIS — R600 Localized edema: Secondary | ICD-10-CM | POA: Diagnosis present

## 2016-11-16 DIAGNOSIS — I700 Atherosclerosis of aorta: Secondary | ICD-10-CM | POA: Diagnosis present

## 2016-11-16 DIAGNOSIS — K5900 Constipation, unspecified: Secondary | ICD-10-CM

## 2016-11-16 DIAGNOSIS — B001 Herpesviral vesicular dermatitis: Secondary | ICD-10-CM | POA: Diagnosis present

## 2016-11-16 DIAGNOSIS — M353 Polymyalgia rheumatica: Secondary | ICD-10-CM | POA: Diagnosis present

## 2016-11-16 DIAGNOSIS — Z86718 Personal history of other venous thrombosis and embolism: Secondary | ICD-10-CM | POA: Diagnosis not present

## 2016-11-16 DIAGNOSIS — R740 Nonspecific elevation of levels of transaminase and lactic acid dehydrogenase [LDH]: Secondary | ICD-10-CM | POA: Diagnosis present

## 2016-11-16 DIAGNOSIS — E8809 Other disorders of plasma-protein metabolism, not elsewhere classified: Secondary | ICD-10-CM | POA: Diagnosis present

## 2016-11-16 DIAGNOSIS — Z79899 Other long term (current) drug therapy: Secondary | ICD-10-CM

## 2016-11-16 DIAGNOSIS — Z886 Allergy status to analgesic agent status: Secondary | ICD-10-CM | POA: Diagnosis not present

## 2016-11-16 DIAGNOSIS — R000 Tachycardia, unspecified: Secondary | ICD-10-CM | POA: Diagnosis present

## 2016-11-16 DIAGNOSIS — D72829 Elevated white blood cell count, unspecified: Secondary | ICD-10-CM | POA: Diagnosis not present

## 2016-11-16 DIAGNOSIS — M791 Myalgia: Secondary | ICD-10-CM | POA: Diagnosis present

## 2016-11-16 DIAGNOSIS — Z8042 Family history of malignant neoplasm of prostate: Secondary | ICD-10-CM | POA: Diagnosis not present

## 2016-11-16 DIAGNOSIS — Z96698 Presence of other orthopedic joint implants: Secondary | ICD-10-CM | POA: Diagnosis not present

## 2016-11-16 DIAGNOSIS — K0510 Chronic gingivitis, plaque induced: Secondary | ICD-10-CM | POA: Diagnosis present

## 2016-11-16 DIAGNOSIS — M7989 Other specified soft tissue disorders: Secondary | ICD-10-CM | POA: Diagnosis not present

## 2016-11-16 DIAGNOSIS — Z7951 Long term (current) use of inhaled steroids: Secondary | ICD-10-CM

## 2016-11-16 DIAGNOSIS — E890 Postprocedural hypothyroidism: Secondary | ICD-10-CM | POA: Diagnosis present

## 2016-11-16 DIAGNOSIS — Z7901 Long term (current) use of anticoagulants: Secondary | ICD-10-CM

## 2016-11-16 DIAGNOSIS — A419 Sepsis, unspecified organism: Secondary | ICD-10-CM

## 2016-11-16 DIAGNOSIS — R6510 Systemic inflammatory response syndrome (SIRS) of non-infectious origin without acute organ dysfunction: Secondary | ICD-10-CM | POA: Diagnosis not present

## 2016-11-16 DIAGNOSIS — R011 Cardiac murmur, unspecified: Secondary | ICD-10-CM | POA: Diagnosis present

## 2016-11-16 DIAGNOSIS — Z66 Do not resuscitate: Secondary | ICD-10-CM | POA: Diagnosis present

## 2016-11-16 DIAGNOSIS — G4700 Insomnia, unspecified: Secondary | ICD-10-CM | POA: Diagnosis present

## 2016-11-16 DIAGNOSIS — Z881 Allergy status to other antibiotic agents status: Secondary | ICD-10-CM | POA: Diagnosis not present

## 2016-11-16 DIAGNOSIS — D473 Essential (hemorrhagic) thrombocythemia: Secondary | ICD-10-CM | POA: Diagnosis not present

## 2016-11-16 DIAGNOSIS — Z885 Allergy status to narcotic agent status: Secondary | ICD-10-CM | POA: Diagnosis not present

## 2016-11-16 LAB — CBC WITH DIFFERENTIAL/PLATELET
Basophils Absolute: 0 10*3/uL (ref 0.0–0.1)
Basophils Relative: 0 %
EOS PCT: 1 %
Eosinophils Absolute: 0.3 10*3/uL (ref 0.0–0.7)
HEMATOCRIT: 37 % (ref 36.0–46.0)
HEMOGLOBIN: 12.5 g/dL (ref 12.0–15.0)
LYMPHS ABS: 2.1 10*3/uL (ref 0.7–4.0)
Lymphocytes Relative: 7 %
MCH: 27.7 pg (ref 26.0–34.0)
MCHC: 33.8 g/dL (ref 30.0–36.0)
MCV: 82 fL (ref 78.0–100.0)
MONOS PCT: 7 %
Monocytes Absolute: 2.1 10*3/uL — ABNORMAL HIGH (ref 0.1–1.0)
NEUTROS ABS: 25 10*3/uL — AB (ref 1.7–7.7)
Neutrophils Relative %: 85 %
Platelets: 515 10*3/uL — ABNORMAL HIGH (ref 150–400)
RBC: 4.51 MIL/uL (ref 3.87–5.11)
RDW: 15.1 % (ref 11.5–15.5)
WBC: 29.5 10*3/uL — AB (ref 4.0–10.5)

## 2016-11-16 LAB — URINALYSIS, ROUTINE W REFLEX MICROSCOPIC
BACTERIA UA: NONE SEEN
BILIRUBIN URINE: NEGATIVE
Glucose, UA: NEGATIVE mg/dL
KETONES UR: NEGATIVE mg/dL
NITRITE: NEGATIVE
Protein, ur: 100 mg/dL — AB
Specific Gravity, Urine: 1.024 (ref 1.005–1.030)
pH: 5 (ref 5.0–8.0)

## 2016-11-16 LAB — GRAM STAIN

## 2016-11-16 LAB — COMPREHENSIVE METABOLIC PANEL
ALBUMIN: 2.2 g/dL — AB (ref 3.5–5.0)
ALT: 26 U/L (ref 14–54)
AST: 36 U/L (ref 15–41)
Alkaline Phosphatase: 120 U/L (ref 38–126)
Anion gap: 13 (ref 5–15)
BUN: 18 mg/dL (ref 6–20)
CO2: 26 mmol/L (ref 22–32)
Calcium: 8.3 mg/dL — ABNORMAL LOW (ref 8.9–10.3)
Chloride: 93 mmol/L — ABNORMAL LOW (ref 101–111)
Creatinine, Ser: 0.95 mg/dL (ref 0.44–1.00)
GFR calc Af Amer: >60 mL/min (ref >60)
GFR calc non Af Amer: 54 mL/min — ABNORMAL LOW (ref >60)
GLUCOSE: 133 mg/dL — AB (ref 65–99)
POTASSIUM: 4.1 mmol/L (ref 3.5–5.1)
Sodium: 132 mmol/L — ABNORMAL LOW (ref 135–145)
Total Bilirubin: 0.6 mg/dL (ref 0.3–1.2)
Total Protein: 6.1 g/dL — ABNORMAL LOW (ref 6.5–8.1)

## 2016-11-16 LAB — I-STAT CG4 LACTIC ACID, ED
LACTIC ACID, VENOUS: 2.48 mmol/L — AB (ref 0.5–1.9)
LACTIC ACID, VENOUS: 1.28 mmol/L (ref 0.5–1.9)

## 2016-11-16 LAB — CREATININE, URINE, RANDOM: Creatinine, Urine: 174.42 mg/dL

## 2016-11-16 LAB — PROTIME-INR
INR: 3.13
PROTHROMBIN TIME: 32.9 s — AB (ref 11.4–15.2)

## 2016-11-16 LAB — SODIUM, URINE, RANDOM: Sodium, Ur: 31 mmol/L

## 2016-11-16 LAB — OSMOLALITY, URINE: Osmolality, Ur: 751 mOsm/kg (ref 300–900)

## 2016-11-16 LAB — BRAIN NATRIURETIC PEPTIDE: B Natriuretic Peptide: 196.7 pg/mL — ABNORMAL HIGH (ref 0.0–100.0)

## 2016-11-16 LAB — PROCALCITONIN: PROCALCITONIN: 0.45 ng/mL

## 2016-11-16 MED ORDER — PIPERACILLIN-TAZOBACTAM 3.375 G IVPB 30 MIN
3.3750 g | Freq: Once | INTRAVENOUS | Status: AC
  Administered 2016-11-16: 3.375 g via INTRAVENOUS
  Filled 2016-11-16: qty 50

## 2016-11-16 MED ORDER — VANCOMYCIN HCL IN DEXTROSE 750-5 MG/150ML-% IV SOLN
750.0000 mg | INTRAVENOUS | Status: DC
  Administered 2016-11-17: 750 mg via INTRAVENOUS
  Filled 2016-11-16 (×2): qty 150

## 2016-11-16 MED ORDER — VANCOMYCIN HCL IN DEXTROSE 1-5 GM/200ML-% IV SOLN
1000.0000 mg | Freq: Once | INTRAVENOUS | Status: AC
  Administered 2016-11-16: 1000 mg via INTRAVENOUS
  Filled 2016-11-16: qty 200

## 2016-11-16 MED ORDER — PIPERACILLIN-TAZOBACTAM 3.375 G IVPB
3.3750 g | Freq: Three times a day (TID) | INTRAVENOUS | Status: DC
  Administered 2016-11-17 – 2016-11-18 (×5): 3.375 g via INTRAVENOUS
  Filled 2016-11-16 (×7): qty 50

## 2016-11-16 MED ORDER — SODIUM CHLORIDE 0.9 % IV BOLUS (SEPSIS)
1000.0000 mL | Freq: Once | INTRAVENOUS | Status: AC
  Administered 2016-11-16: 1000 mL via INTRAVENOUS

## 2016-11-16 NOTE — ED Provider Notes (Signed)
[facility] DEPT Provider Note   CSN: [id number] Arrival date & time: 11/16/16  1809     History   Chief Complaint Chief Complaint  Patient presents with  . Weakness    HPI Vanessa Page is a 83 y.o. female.  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 Patient's home health nurse evaluated her today, found that she had a fever as well as "dirty urine" and recommended that she come into the ED for evaluation. Patient reports generalized weakness, worse since she was discharged from the hospital. Also reports bilateral lower extremity edema, with no tenderness to palpation of the calves or associated pain. Patient had a venous stasis ulcer to the inside of her right lower leg several weeks ago that has subsequently healed. Patient denies dysuria, frequency, nausea, vomiting, diarrhea, abdominal pain, cough, shortness of breath, or sore throat. States that she is completely asymptomatic with the exception of fatigue and generalized weakness. No chest pain. Also reports decreased appetite recently.  Past Medical History:  Diagnosis Date  . Aortic regurgitation 12/24/2013   moderate  . Bronchiectasis ([place])   . Bursitis of right shoulder   . DVT (deep venous thrombosis) ([place])   . 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 Mitral regurgitation 12/24/2013   mild    Patient Active Problem List   Diagnosis Date Noted  . SIRS (systemic inflammatory response syndrome) ([place]) 11/16/2016  . Murmur 11/16/2016  . Myalgia 11/01/2016  . Leukocytosis 11/01/2016  . UTI (urinary tract infection) 11/01/2016  . Hyponatremia 11/01/2016  . Weakness   . Hypotension, unspecified 01/08/2014  . Knee pain, left 01/08/2014  . Hip fracture requiring operative repair ([place]) 12/26/2013  .  Leukocytosis, unspecified 12/25/2013  . Intertrochanteric fracture of left hip ([place]) 12/23/2013  . Hip fracture ([place]) 12/23/2013  . Chronic anticoagulation 12/23/2013  . History of DVT of lower extremity 12/23/2013  . Bronchiectasis ([place]) 12/23/2013  . Essential hypertension 12/23/2013    Past Surgical History:  Procedure Laterality Date  . INTRAMEDULLARY (IM) NAIL INTERTROCHANTERIC Left 12/24/2013   Procedure: INTRAMEDULLARY (IM) NAIL INTERTROCHANTRIC;  Surgeon: Mcarthur Rossetti, MD;  Location: [facility] ORS;  Service: Orthopedics;  Laterality: Left;  . THYROIDECTOMY, PARTIAL    . TONSILLECTOMY      OB History    No data available       Home Medications    Prior to Admission medications   Medication Sig Start Date End Date Taking? Authorizing Provider  azelastine (ASTELIN) 137 MCG/SPRAY nasal spray Place 2 sprays into both nostrils at bedtime as needed for allergies. Use in each nostril as directed   Yes Historical Provider, MD  calcium carbonate (OSCAL) 1500 (600 Ca) MG TABS tablet Take 600 mg of elemental calcium by mouth daily with breakfast.   Yes Historical Provider, MD  cefdinir (OMNICEF) 300 MG capsule Take 300 mg by mouth See admin instructions. TAKES 10-DAY REGIMEN ONLY AS NEEDED FOR LUNG PROBLEMS   Yes Historical Provider, MD  conjugated estrogens (PREMARIN) vaginal cream Place 1 Applicatorful vaginally 2 (two) times a week.   Yes Historical Provider, MD  denosumab (PROLIA) 60 MG/ML SOLN injection Inject 60 mg into the skin every 6 (six) months. Administer in upper arm, thigh, or abdomen   Yes Historical Provider, MD  feeding supplement, ENSURE ENLIVE, (ENSURE ENLIVE) LIQD Take  237 mLs by mouth 2 (two) times daily between meals. 11/03/16  Yes Lavina Hamman, MD  fluticasone (FLONASE) 50 MCG/ACT nasal spray Place into both nostrils daily.   Yes Historical Provider, MD  polyethylene glycol (MIRALAX / GLYCOLAX) packet Take 17 g by mouth daily as needed. 11/03/16  Yes Lavina Hamman, MD  Vitamin D, Ergocalciferol, (DRISDOL) 50000 units CAPS capsule Take 50,000 Units by mouth every 30 (thirty) days.   Yes Historical Provider, MD  warfarin (COUMADIN) 1 MG tablet Take 1-2 mg by mouth See admin instructions. Takes 2mg  on mon,wed,fri  Takes 1mg  all other days   Yes Historical Provider, MD    Family History Family History  Problem Relation Age of Onset  . Prostate cancer Father     Social History Social History  Substance Use Topics  . Smoking status: Never Smoker  . Smokeless tobacco: Never Used  . Alcohol use No     Comment: occasional wine      Allergies   Other and Codeine   Review of Systems Review of Systems  Constitutional: Positive for activity change, appetite change and fatigue. Negative for chills, diaphoresis and fever.  HENT: Negative for congestion, rhinorrhea and sore throat.   Eyes: Negative for visual disturbance.  Respiratory: Negative for cough, shortness of breath and wheezing.   Cardiovascular: Positive for leg swelling. Negative for chest pain and palpitations.  Gastrointestinal: Negative for abdominal distention, abdominal pain, blood in stool, constipation, diarrhea, nausea and vomiting.  Genitourinary: Positive for decreased urine volume. Negative for dysuria, flank pain and frequency.  Musculoskeletal: Negative for arthralgias, back pain, gait problem, joint swelling, myalgias, neck pain and neck stiffness.  Skin: Negative for rash.  Neurological: Positive for weakness (generalized). Negative for dizziness, facial asymmetry, speech difficulty, numbness and headaches.  Psychiatric/Behavioral: Negative for agitation, behavioral problems and confusion.     Physical Exam Updated Vital Signs BP 123/60   Pulse (!) 101   Temp 98.3 F (36.8 C) (Oral)   Resp 20   Ht 5' (1.524 m)   Wt 57.2 kg   SpO2 98%   BMI 24.65 kg/m   Physical Exam  Constitutional: She is oriented to person, place, and time. No distress.  thin  HENT:    Head: Normocephalic and atraumatic.  Eyes: Conjunctivae and EOM are normal. Pupils are equal, round, and reactive to light.  Neck: Normal range of motion. Neck supple.  Cardiovascular: Regular rhythm and intact distal pulses.   No murmur heard. Tachycardic to 115 bpm  Pulmonary/Chest: Effort normal and breath sounds normal. No respiratory distress. She has no wheezes. She has no rales.  Abdominal: Soft. Bowel sounds are normal. She exhibits no distension. There is no tenderness. There is no guarding.  Musculoskeletal: Normal range of motion. She exhibits edema (2+ pitting edema to the bilateral LEs up to the knees).  Neurological: She is alert and oriented to person, place, and time. She exhibits normal muscle tone.  Skin: Skin is warm and dry. She is not diaphoretic.  Psychiatric: She has a normal mood and affect.  Nursing note and vitals reviewed.    ED Treatments / Results  Labs (all labs ordered are listed, but only abnormal results are displayed) Labs Reviewed  COMPREHENSIVE METABOLIC PANEL - Abnormal; Notable for the following:       Result Value   Sodium 132 (*)    Chloride 93 (*)    Glucose, Bld 133 (*)    Calcium 8.3 (*)  Total Protein 6.1 (*)    Albumin 2.2 (*)    GFR calc non Af Amer 54 (*)    All other components within normal limits  CBC WITH DIFFERENTIAL/PLATELET - Abnormal; Notable for the following:    WBC 29.5 (*)    Platelets 515 (*)    Neutro Abs 25.0 (*)    Monocytes Absolute 2.1 (*)    All other components within normal limits  URINALYSIS, ROUTINE W REFLEX MICROSCOPIC - Abnormal; Notable for the following:    APPearance HAZY (*)    Hgb urine dipstick SMALL (*)    Protein, ur 100 (*)    Leukocytes, UA SMALL (*)    Squamous Epithelial / LPF 0-5 (*)    All other components within normal limits  PROTIME-INR - Abnormal; Notable for the following:    Prothrombin Time 32.9 (*)    All other components within normal limits  BRAIN NATRIURETIC PEPTIDE -  Abnormal; Notable for the following:    B Natriuretic Peptide 196.7 (*)    All other components within normal limits  CK - Abnormal; Notable for the following:    Total CK 20 (*)    All other components within normal limits  I-STAT CG4 LACTIC ACID, ED - Abnormal; Notable for the following:    Lactic Acid, Venous 2.48 (*)    All other components within normal limits  GRAM STAIN  CULTURE, BLOOD (ROUTINE X 2)  CULTURE, BLOOD (ROUTINE X 2)  URINE CULTURE  RESPIRATORY PANEL BY PCR  PROCALCITONIN  CREATININE, URINE, RANDOM  SODIUM, URINE, RANDOM  OSMOLALITY, URINE  LACTATE DEHYDROGENASE  SEDIMENTATION RATE  TROPONIN I  TROPONIN I  TROPONIN I  ALDOLASE  ANTINUCLEAR ANTIBODIES, IFA  PATHOLOGIST SMEAR REVIEW  PROTIME-INR  MAGNESIUM  PHOSPHORUS  TSH  COMPREHENSIVE METABOLIC PANEL  CBC WITH DIFFERENTIAL/PLATELET  PREALBUMIN  I-STAT CG4 LACTIC ACID, ED    EKG  EKG Interpretation None       Radiology Dg Chest 2 View  Result Date: 11/16/2016 CLINICAL DATA:  83 year old female with history of shortness of breath for the past 2 weeks, worsening on exertion. History bronchiectasis. EXAM: CHEST  2 VIEW COMPARISON:  Chest x-ray 11/01/2016. FINDINGS: No consolidative airspace disease. Small left pleural effusion blunting the left costophrenic sulcus. No right pleural effusion. No evidence of pulmonary edema. No definite suspicious appearing pulmonary nodules or masses. Heart size is normal. The patient is rotated to the left on today's exam, resulting in distortion of the mediastinal contours and reduced diagnostic sensitivity and specificity for mediastinal pathology. Atherosclerosis in the thoracic aorta. Bilateral apical pleuroparenchymal thickening, partially calcified, most compatible with chronic post infectious or inflammatory scarring. IMPRESSION: 1. Small left pleural effusion. 2. Aortic atherosclerosis. Electronically Signed   By: Vinnie Langton M.D.   On: 11/16/2016 20:21     Procedures Procedures (including critical care time)  Medications Ordered in ED Medications  piperacillin-tazobactam (ZOSYN) IVPB 3.375 g (not administered)  vancomycin (VANCOCIN) IVPB 750 mg/150 ml premix (not administered)  polyethylene glycol (MIRALAX / GLYCOLAX) packet 17 g (not administered)  sodium chloride flush (NS) 0.9 % injection 3 mL (not administered)  acetaminophen (TYLENOL) tablet 650 mg (not administered)    Or  acetaminophen (TYLENOL) suppository 650 mg (not administered)  HYDROcodone-acetaminophen (NORCO/VICODIN) 5-325 MG per tablet 1-2 tablet (not administered)  ondansetron (ZOFRAN) tablet 4 mg (not administered)    Or  ondansetron (ZOFRAN) injection 4 mg (not administered)  0.9 %  sodium chloride infusion (not administered)  senna (SENOKOT)  tablet 8.6 mg (not administered)  Warfarin - Pharmacist Dosing Inpatient (not administered)  sodium chloride 0.9 % bolus 1,000 mL (0 mLs Intravenous Stopped 11/16/16 1949)  piperacillin-tazobactam (ZOSYN) IVPB 3.375 g (0 g Intravenous Stopped 11/16/16 2017)  vancomycin (VANCOCIN) IVPB 1000 mg/200 mL premix (0 mg Intravenous Stopped 11/16/16 2042)     Initial Impression / Assessment and Plan / ED Course  I have reviewed the triage vital signs and the nursing notes.  Pertinent labs & imaging results that were available during my care of the patient were reviewed by me and considered in my medical decision making (see chart for details).     Patient is afebrile and tachycardic on arrival. Other than fatigue and generalized weakness, she denies additional symptoms. Blood and urine cultures ordered. UA with small leuks, but otherwise not concerning for UTI and patient denies dysuria or frequency. Chest x-ray with a small left pleural effusion, but no focal consolidation to suggest pneumonia and patient denies respiratory symptoms. Her abdominal exam is benign, and suspicion for intra-abdominal pathology. She had a recent venous  stasis ulcer to her right lower extremity, but the site is well-healed, and I doubt underlying osteomyelitis or deep tissue infection. Patient is mentating normally, with no meningismus, and I doubt CNS infection.  Patient has a lactic acidosis, with lactic acid of 2.48. CBC with large leukocytosis to 29.5. Vancomycin and Zosyn given for broad-spectrum coverage of sepsis of so far unknown etiology. Total protein and albumin are both low, consistent with poor nutritional status. This may be contributing to the patient's bilateral lower extremity edema. She has no calf tenderness, edema is bilateral and symmetric, and there is no indication for emergent lower extremity ultrasounds.  Patient given 1 L of normal saline in the ED. Plan to admit to hospitalist medicine for management of sepsis of unknown origin.  Care of patient overseen by my attending, Dr. Stark Jock.   Final Clinical Impressions(s) / ED Diagnoses   Final diagnoses:  Sepsis, due to unspecified organism [facility])    New Prescriptions Current Discharge Medication List       Zipporah Plants, MD 11/17/16 0045    Veryl Speak, MD 11/17/16 1555

## 2016-11-16 NOTE — ED Notes (Signed)
Nurse drawing labs. 

## 2016-11-16 NOTE — ED Notes (Signed)
Dr Adela Glimpse RN in room.

## 2016-11-16 NOTE — ED Triage Notes (Signed)
Per EMS, pt sent here for evaluation of worsening weakness. Pt was admitted here 2 weeks ago for elevated wbc and told she possibly had a UTI but unsure. Pt has pitting edema in both legs which has been going on for 6 weeks. Pt febrile here 101.3. EMS VS 142/79, Pulse 110, RR 16, 98% on 3L. Pt was placed on 3L [place] by fire on scene for spo2 of 90% on RA. Pt alert and oriented x 4.

## 2016-11-16 NOTE — ED Notes (Signed)
Pt provided with sandwich, crackers and cranberry juice.

## 2016-11-16 NOTE — H&P (Signed)
Vanessa Page FVC:[id number] DOB: Sep 07, 1931 DOA: 11/16/2016     PCP: Pcp Not In System   Outpatient Specialists: Hematologist  Patient coming from:   home Lives  With family    Chief Complaint: fever  HPI: Vanessa Page is a 83 y.o. female with medical history significant of bronchiectasis factor C def, history of DVT lower extremities,  hyponatremia    Presented with diffuse weakness fevers after 101.3 patient was noted to tachycardia Dr. 110 she endorses lower extremity edema bilaterally on room air oxygen saturation was 90% on the mass arrival and she was started on 3 L. Since her admission she has been gradually getting weaker. Despite home PT never got better. States the weakness is worse in lower extremities. But arms have improved. She has had bilateral leg edema. Denies any sick contacts. NO diarrhea. She has been more sleepy recently.  States the decreased exercise tolerance started 6 weeks ago after she had Colonoscopy (showed only polyp) since then have had dyspnea with activity and progressive weakness.  Patient was recently admitted for UTI associated diffuse weakness she was prior to this on Bactrim and thought this was secondary to antibiotics given myalgias felt to be serum sickness-like presentation neurology was consulted and felt that she has no ongoing GBS, stating he likes symptoms she was discharged home with PT OT home health. TI was treated with IV ceftriaxone she was discharged on oral Keflex Patient was discharged home 2/27 of February  Of note patient has known history of venous stasis ulcer on the right lower extremity that been healing well. Otherwise she hasn't had any nausea or vomiting no diarrhea no abdominal pain no cough no shortness of breath also throat no chest pain Regarding pertinent Chronic problems: History of factor C deficiency and  DVT on Coumadin   IN ER:  Temp (24hrs), Avg:99.8 F (37.7 C), Min:98.3 F (36.8 C), Max:101.3 F (38.5 C)     Meeting sepsis criteria WBC count 29.5 patient have had persistent leukocytosis 1 back to 2015   lactic acid 2.48 RR up to 27 Repeat lactic acid now down to 1.28  Sodium 132 K4.1 creatinine 0.95 albumin 2.2 which is at her baseline  Chest x-ray showing small left pleural effusion aortic atherosclerosis Following Medications were ordered in ER: Medications  piperacillin-tazobactam (ZOSYN) IVPB 3.375 g (not administered)  vancomycin (VANCOCIN) IVPB 750 mg/150 ml premix (not administered)  sodium chloride 0.9 % bolus 1,000 mL (0 mLs Intravenous Stopped 11/16/16 1949)  piperacillin-tazobactam (ZOSYN) IVPB 3.375 g (0 g Intravenous Stopped 11/16/16 2017)  vancomycin (VANCOCIN) IVPB 1000 mg/200 mL premix (0 mg Intravenous Stopped 11/16/16 2042)    Hospitalist was called for admission for SIRS   Review of Systems:    Pertinent positives include:  Fevers, chills, fatigue, history myalgias  Constitutional:  No weight loss, night sweats, weight loss  HEENT:  No headaches, Difficulty swallowing,Tooth/dental problems,Sore throat,  No sneezing, itching, ear ache, nasal congestion, post nasal drip,  Cardio-vascular:  No chest pain, Orthopnea, PND, anasarca, dizziness, palpitations.no Bilateral lower extremity swelling  GI:  No heartburn, indigestion, abdominal pain, nausea, vomiting, diarrhea, change in bowel habits, loss of appetite, melena, blood in stool, hematemesis Resp:  no shortness of breath at rest. No dyspnea on exertion, No excess mucus, no productive cough, No non-productive cough, No coughing up of blood.No change in color of mucus.No wheezing. Skin:  no rash or lesions. No jaundice GU:  no dysuria, change in color of urine, no urgency or  frequency. No straining to urinate.  No flank pain.  Musculoskeletal:  No joint pain or no joint swelling. No decreased range of motion. No back pain.  Psych:  No change in mood or affect. No depression or anxiety. No memory loss.  Neuro: no  localizing neurological complaints, no tingling, no weakness, no double vision, no gait abnormality, no slurred speech, no confusion  As per HPI otherwise 10 point review of systems negative.   Past Medical History: Past Medical History:  Diagnosis Date  . Aortic regurgitation 12/24/2013   moderate  . Bronchiectasis ([place])   . Bursitis of right shoulder   . DVT (deep venous thrombosis) ([place])   . 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 Mitral regurgitation 12/24/2013   mild   Past Surgical History:  Procedure Laterality Date  . INTRAMEDULLARY (IM) NAIL INTERTROCHANTERIC Left 12/24/2013   Procedure: INTRAMEDULLARY (IM) NAIL INTERTROCHANTRIC;  Surgeon: Mcarthur Rossetti, MD;  Location: [facility];  Service: Orthopedics;  Laterality: Left;  . THYROIDECTOMY, PARTIAL    . TONSILLECTOMY       Social History:  Ambulatory  walker or  wheelchair bound,      reports that she has never smoked. She has never used smokeless tobacco. She reports that she does not drink alcohol or use drugs.  Allergies:   Allergies  Allergen Reactions  . Other Other (See Comments)    Patient has an intolerance to Augmentin,Zithromax,Bioxin,Avalox, & Erythromycin. She can not take them unless they are needed for Bronchiectasis  . Codeine Other (See Comments)    Unknown reaction       Family History:   Family History  Problem Relation Age of Onset  . Prostate cancer Father     Medications: Prior to Admission medications   Medication Sig Start Date End Date Taking? Authorizing Provider  azelastine (ASTELIN) 137 MCG/SPRAY nasal spray Place 2 sprays into both nostrils at bedtime as needed for allergies. Use in each nostril as directed   Yes Historical Provider, MD  calcium carbonate (OSCAL) 1500 (600 Ca) MG TABS tablet Take 600 mg of elemental calcium by mouth daily with breakfast.   Yes Historical Provider, MD  cefdinir (OMNICEF) 300 MG capsule Take 300 mg  by mouth See admin instructions. TAKES 10-DAY REGIMEN ONLY AS NEEDED FOR LUNG PROBLEMS   Yes Historical Provider, MD  conjugated estrogens (PREMARIN) vaginal cream Place 1 Applicatorful vaginally 2 (two) times a week.   Yes Historical Provider, MD  denosumab (PROLIA) 60 MG/ML SOLN injection Inject 60 mg into the skin every 6 (six) months. Administer in upper arm, thigh, or abdomen   Yes Historical Provider, MD  feeding supplement, ENSURE ENLIVE, (ENSURE ENLIVE) LIQD Take 237 mLs by mouth 2 (two) times daily between meals. 11/03/16  Yes Lavina Hamman, MD  fluticasone (FLONASE) 50 MCG/ACT nasal spray Place into both nostrils daily.   Yes Historical Provider, MD  polyethylene glycol (MIRALAX / GLYCOLAX) packet Take 17 g by mouth daily as needed. 11/03/16  Yes Lavina Hamman, MD  Vitamin D, Ergocalciferol, (DRISDOL) 50000 units CAPS capsule Take 50,000 Units by mouth every 30 (thirty) days.   Yes Historical Provider, MD  warfarin (COUMADIN) 1 MG tablet Take 1-2 mg by mouth See admin instructions. Takes 2mg  on mon,wed,fri  Takes 1mg  all other days   Yes Historical Provider, MD    Physical Exam: Patient Vitals for the past 24 hrs:  BP Temp Temp src Pulse Resp SpO2 Height  Weight  11/16/16 2113 - 98.3 F (36.8 C) Oral - - - - -  11/16/16 2100 118/55 - - 94 - 93 % - -  11/16/16 2048 121/58 - - 94 - 97 % - -  11/16/16 1945 126/55 - - 92 18 99 % - -  11/16/16 1900 138/78 - - 117 (!) 34 98 % - -  11/16/16 1845 128/68 - - (!) 121 19 98 % - -  11/16/16 1830 141/65 - - 97 24 97 % - -  11/16/16 1817 - - - - - - 5' (1.524 m) 56.2 kg (124 lb)  11/16/16 1816 143/85 101.3 F (38.5 C) Axillary 119 (!) 28 97 % - -  11/16/16 1810 - - - - - 98 % - -    1. General:  in No Acute distress 2. Psychological: Alert and  Oriented 3. Head/ENT:     Dry Mucous Membranes                          Head Non traumatic, neck supple                            Poor Dentition 4. SKIN decreased Skin turgor,  Skin clean Dry and  intact no rash 5. Heart: Regular rate and rhythm  systolic Murmur, Rub or gallop 6. Lungs: no wheezes or crackles   7. Abdomen: Soft,  non-tender, Non distended 8. Lower extremities: no clubbing, cyanosis, 2+  Edema up to calf bilateral tenderness 9. Neurologically Grossly intact, moving all 4 extremities equally   10. MSK: Normal range of motion   body mass index is 24.22 kg/m.  Labs on Admission:   Labs on Admission: I have personally reviewed following labs and imaging studies  CBC:  Recent Labs Lab 11/16/16 1820  WBC 29.5*  NEUTROABS 25.0*  HGB 12.5  HCT 37.0  MCV 82.0  PLT 765*   Basic Metabolic Panel:  Recent Labs Lab 11/16/16 1820  NA 132*  K 4.1  CL 93*  CO2 26  GLUCOSE 133*  BUN 18  CREATININE 0.95  CALCIUM 8.3*   GFR: Estimated Creatinine Clearance: 35.3 mL/min (by C-G formula based on SCr of 0.95 mg/dL). Liver Function Tests:  Recent Labs Lab 11/16/16 1820  AST 36  ALT 26  ALKPHOS 120  BILITOT 0.6  PROT 6.1*  ALBUMIN 2.2*   No results for input(s): LIPASE, AMYLASE in the last 168 hours. No results for input(s): AMMONIA in the last 168 hours. Coagulation Profile: No results for input(s): INR, PROTIME in the last 168 hours. Cardiac Enzymes: No results for input(s): CKTOTAL, CKMB, CKMBINDEX, TROPONINI in the last 168 hours. BNP (last 3 results) No results for input(s): PROBNP in the last 8760 hours. HbA1C: No results for input(s): HGBA1C in the last 72 hours. CBG: No results for input(s): GLUCAP in the last 168 hours. Lipid Profile: No results for input(s): CHOL, HDL, LDLCALC, TRIG, CHOLHDL, LDLDIRECT in the last 72 hours. Thyroid Function Tests: No results for input(s): TSH, T4TOTAL, FREET4, T3FREE, THYROIDAB in the last 72 hours. Anemia Panel: No results for input(s): VITAMINB12, FOLATE, FERRITIN, TIBC, IRON, RETICCTPCT in the last 72 hours. Urine analysis:    Component Value Date/Time   COLORURINE YELLOW 11/16/2016 1939    APPEARANCEUR HAZY (A) 11/16/2016 1939   LABSPEC 1.024 11/16/2016 1939   PHURINE 5.0 11/16/2016 1939   GLUCOSEU NEGATIVE 11/16/2016 1939  HGBUR SMALL (A) 11/16/2016 1939   BILIRUBINUR NEGATIVE 11/16/2016 1939   KETONESUR NEGATIVE 11/16/2016 1939   PROTEINUR 100 (A) 11/16/2016 1939   UROBILINOGEN 1.0 12/26/2013 2232   NITRITE NEGATIVE 11/16/2016 1939   LEUKOCYTESUR SMALL (A) 11/16/2016 1939   Sepsis Labs: @LABRCNTIP (procalcitonin:4,lacticidven:4) ) Recent Results (from the past 240 hour(s))  Gram stain     Status: None (Preliminary result)   Collection Time: 11/16/16  7:39 PM  Result Value Ref Range Status   Specimen Description URINE, CATHETERIZED  Final   Special Requests NONE  Final   Gram Stain   Final    SQUAMOUS EPITHELIAL CELLS PRESENT WBC PRESENT, PREDOMINANTLY PMN GRAM POSITIVE RODS GRAM POSITIVE COCCI IN PAIRS CYTOSPIN SMEAR    Report Status PENDING  Incomplete       UA  no evidence of UTI    No results found for: HGBA1C  Estimated Creatinine Clearance: 35.3 mL/min (by C-G formula based on SCr of 0.95 mg/dL).  BNP (last 3 results) No results for input(s): PROBNP in the last 8760 hours.   ECG REPORT ordered  [facility] Weights   11/16/16 1817  Weight: 56.2 kg (124 lb)     Cultures:    Component Value Date/Time   SDES URINE, CATHETERIZED 11/16/2016 1939   SPECREQUEST NONE 11/16/2016 1939   CULT  11/01/2016 1115    NO GROWTH 5 DAYS Performed at [facility], [place] [address], [place], [place] [address]    REPTSTATUS PENDING 11/16/2016 1939     Radiological Exams on Admission: Dg Chest 2 View  Result Date: 11/16/2016 CLINICAL DATA:  83 year old female with history of shortness of breath for the past 2 weeks, worsening on exertion. History bronchiectasis. EXAM: CHEST  2 VIEW COMPARISON:  Chest x-ray 11/01/2016. FINDINGS: No consolidative airspace disease. Small left pleural effusion blunting the left costophrenic sulcus. No right pleural effusion.  No evidence of pulmonary edema. No definite suspicious appearing pulmonary nodules or masses. Heart size is normal. The patient is rotated to the left on today's exam, resulting in distortion of the mediastinal contours and reduced diagnostic sensitivity and specificity for mediastinal pathology. Atherosclerosis in the thoracic aorta. Bilateral apical pleuroparenchymal thickening, partially calcified, most compatible with chronic post infectious or inflammatory scarring. IMPRESSION: 1. Small left pleural effusion. 2. Aortic atherosclerosis. Electronically Signed   By: Vinnie Langton M.D.   On: 11/16/2016 20:21    Chart has been reviewed    Assessment/Plan   83 y.o. female with medical history significant of bronchiectasis factor C def, history of DVT lower extremities,  Hyponatremia admitted for SIRS  Present on Admission: . SIRS (systemic inflammatory response syndrome) (HCC) - currently stable fever result lactic acid has come down  source unclear, given that this is repeat admission will obtain father imaging CT of the abdomen to look for any occult source of infection and TIMI broad-spectrum antibiotics for now blood cultures pending . Essential hypertension stable currently not on home medications . Hyponatremia - in the setting of fluid overload we will evaluate for heart failure . Leukocytosis - patient has had persistent blood cell count elevation is unclear if this is chronic or recent development smear showing atypical lymphocytes patient already followed by hematologist for factor C deficiency. We'll likely need follow-up and continue workup Order pathology smear review given atypical presentation will obtain sedimentation rate and CK as well  as rheumatological workup Diffuse weakness  - and deconditioning versus myositis  . Murmur - given leg edema will obtain echo  in AM   Other plan as per orders.  DVT prophylaxis:  coumadin   Code Status:   DNR/DNI as per patient    Family  Communication:   Family  at  Bedside  plan of care was discussed with   Husband and Son  Disposition Plan:   likely will need placement for rehabilitation                            Would benefit from PT/OT eval prior to DC   ordered                     Nutrition   consulted                          Consults called: none  Admission status:    inpatient    urine complexity of medical issues will likely require prolonged hospital stay   Level of care    tele      I have spent a total of 56 min on this admission   Keymani Mclean 11/16/2016, 11:47 PM   [facility]  Pager [phone]   after 2 AM please page floor coverage PA If 7AM-7PM, please contact the day team taking care of the patient  [email or web address]  Password [email or web address]

## 2016-11-16 NOTE — Progress Notes (Signed)
Pharmacy Antibiotic Note  Vanessa Page is a 83 y.o. female admitted on 11/16/2016 with sepsis.  Pharmacy has been consulted for vancomycin and zosyn dosing. Pt with Tmax 101.3 and WBC is elevated at 29.5. SCr is WNL and lactic acid is elevated at 2.48.   Plan: Vancomycin 1gm IV x 1 then 750mg  IV Q24H Zosyn 3.375gm IV Q8H (4 hr inf) F/u renal fxn, C&S, clinical status and trough at SS  Height: 5' (152.4 cm) Weight: 124 lb (56.2 kg) IBW/kg (Calculated) : 45.5  Temp (24hrs), Avg:101.3 F (38.5 C), Min:101.3 F (38.5 C), Max:101.3 F (38.5 C)   Recent Labs Lab 11/16/16 1834  LATICACIDVEN 2.48*    Estimated Creatinine Clearance: 41.9 mL/min (by C-G formula based on SCr of 0.6 mg/dL).    Allergies  Allergen Reactions  . Codeine Other (See Comments)    Unknown reaction  . Other Other (See Comments)    Patient has an intolerance to Augmentin,Zithromax,Bioxin,Avalox, & Erythromycin. She can not take them unless they are needed for Bronchiectasis    Antimicrobials this admission: Vanc 3/12>> Zosyn 3/12>>  Dose adjustments this admission: N/A  Microbiology results: Pending  Thank you for allowing pharmacy to be a part of this patient's care.  Joci Dress, Rande Lawman 11/16/2016 7:06 PM

## 2016-11-16 NOTE — ED Notes (Signed)
Elevated CG4 reported to Dr. Stark Jock

## 2016-11-16 NOTE — ED Notes (Signed)
Admitting provider in room.

## 2016-11-16 NOTE — ED Notes (Signed)
Pt's husband here with pt, and states that the home health MD collected blood work/urine on the patient this morning and the WBC was 27000 and the pt was told that she had "dirty urine"

## 2016-11-16 NOTE — ED Notes (Signed)
Sent add on label to main lab. (PCT)

## 2016-11-17 ENCOUNTER — Inpatient Hospital Stay (HOSPITAL_COMMUNITY): Payer: Medicare Other

## 2016-11-17 ENCOUNTER — Encounter (HOSPITAL_COMMUNITY): Payer: Self-pay

## 2016-11-17 DIAGNOSIS — R011 Cardiac murmur, unspecified: Secondary | ICD-10-CM

## 2016-11-17 DIAGNOSIS — D473 Essential (hemorrhagic) thrombocythemia: Secondary | ICD-10-CM

## 2016-11-17 DIAGNOSIS — R609 Edema, unspecified: Secondary | ICD-10-CM

## 2016-11-17 DIAGNOSIS — D72829 Elevated white blood cell count, unspecified: Secondary | ICD-10-CM

## 2016-11-17 DIAGNOSIS — Z8042 Family history of malignant neoplasm of prostate: Secondary | ICD-10-CM

## 2016-11-17 LAB — TROPONIN I
TROPONIN I: 0.03 ng/mL — AB (ref <0.03)
TROPONIN I: 0.04 ng/mL — AB (ref <0.03)
Troponin I: 0.06 ng/mL (ref <0.03)

## 2016-11-17 LAB — CBC WITH DIFFERENTIAL/PLATELET
BASOS ABS: 0 10*3/uL (ref 0.0–0.1)
Basophils Relative: 0 %
EOS PCT: 2 %
Eosinophils Absolute: 0.5 10*3/uL (ref 0.0–0.7)
HEMATOCRIT: 30.6 % — AB (ref 36.0–46.0)
HEMOGLOBIN: 10.2 g/dL — AB (ref 12.0–15.0)
Lymphocytes Relative: 8 %
Lymphs Abs: 2.2 10*3/uL (ref 0.7–4.0)
MCH: 27.2 pg (ref 26.0–34.0)
MCHC: 33.3 g/dL (ref 30.0–36.0)
MCV: 81.6 fL (ref 78.0–100.0)
MONOS PCT: 7 %
Monocytes Absolute: 1.9 10*3/uL — ABNORMAL HIGH (ref 0.1–1.0)
Neutro Abs: 22.3 10*3/uL — ABNORMAL HIGH (ref 1.7–7.7)
Neutrophils Relative %: 83 %
Platelets: 474 10*3/uL — ABNORMAL HIGH (ref 150–400)
RBC: 3.75 MIL/uL — AB (ref 3.87–5.11)
RDW: 15.1 % (ref 11.5–15.5)
WBC: 26.9 10*3/uL — AB (ref 4.0–10.5)

## 2016-11-17 LAB — URINE CULTURE: Culture: NO GROWTH

## 2016-11-17 LAB — PROTIME-INR
INR: 3.36
Prothrombin Time: 34.8 seconds — ABNORMAL HIGH (ref 11.4–15.2)

## 2016-11-17 LAB — COMPREHENSIVE METABOLIC PANEL
ALBUMIN: 1.6 g/dL — AB (ref 3.5–5.0)
ALT: 27 U/L (ref 14–54)
ANION GAP: 8 (ref 5–15)
AST: 44 U/L — AB (ref 15–41)
Alkaline Phosphatase: 118 U/L (ref 38–126)
BUN: 12 mg/dL (ref 6–20)
CHLORIDE: 99 mmol/L — AB (ref 101–111)
CO2: 25 mmol/L (ref 22–32)
Calcium: 7.1 mg/dL — ABNORMAL LOW (ref 8.9–10.3)
Creatinine, Ser: 0.8 mg/dL (ref 0.44–1.00)
GFR calc Af Amer: >60 mL/min (ref >60)
GLUCOSE: 117 mg/dL — AB (ref 65–99)
POTASSIUM: 3.2 mmol/L — AB (ref 3.5–5.1)
Sodium: 132 mmol/L — ABNORMAL LOW (ref 135–145)
TOTAL PROTEIN: 4.9 g/dL — AB (ref 6.5–8.1)
Total Bilirubin: 0.8 mg/dL (ref 0.3–1.2)

## 2016-11-17 LAB — RESPIRATORY PANEL BY PCR
Adenovirus: NOT DETECTED
Bordetella pertussis: NOT DETECTED
CHLAMYDOPHILA PNEUMONIAE-RVPPCR: NOT DETECTED
Coronavirus 229E: NOT DETECTED
Coronavirus HKU1: NOT DETECTED
Coronavirus NL63: NOT DETECTED
Coronavirus OC43: NOT DETECTED
INFLUENZA A-RVPPCR: NOT DETECTED
Influenza B: NOT DETECTED
Metapneumovirus: NOT DETECTED
Mycoplasma pneumoniae: NOT DETECTED
PARAINFLUENZA VIRUS 4-RVPPCR: NOT DETECTED
Parainfluenza Virus 1: NOT DETECTED
Parainfluenza Virus 2: NOT DETECTED
Parainfluenza Virus 3: NOT DETECTED
RHINOVIRUS / ENTEROVIRUS - RVPPCR: NOT DETECTED
Respiratory Syncytial Virus: NOT DETECTED

## 2016-11-17 LAB — ECHOCARDIOGRAM COMPLETE
HEIGHTINCHES: 60 in
Weight: 2019.2 oz

## 2016-11-17 LAB — TSH: TSH: 2.072 u[IU]/mL (ref 0.350–4.500)

## 2016-11-17 LAB — PHOSPHORUS: PHOSPHORUS: 3 mg/dL (ref 2.5–4.6)

## 2016-11-17 LAB — LACTATE DEHYDROGENASE: LDH: 137 U/L (ref 98–192)

## 2016-11-17 LAB — CK: CK TOTAL: 20 U/L — AB (ref 38–234)

## 2016-11-17 LAB — PREALBUMIN

## 2016-11-17 LAB — SEDIMENTATION RATE: Sed Rate: 70 mm/hr — ABNORMAL HIGH (ref 0–22)

## 2016-11-17 LAB — MAGNESIUM: Magnesium: 1.7 mg/dL (ref 1.7–2.4)

## 2016-11-17 MED ORDER — FLUTICASONE PROPIONATE 50 MCG/ACT NA SUSP
2.0000 | Freq: Every day | NASAL | Status: DC
  Administered 2016-11-17 – 2016-11-21 (×4): 2 via NASAL
  Filled 2016-11-17: qty 16

## 2016-11-17 MED ORDER — IOPAMIDOL (ISOVUE-300) INJECTION 61%
INTRAVENOUS | Status: AC
  Administered 2016-11-17: 30 mL
  Filled 2016-11-17: qty 30

## 2016-11-17 MED ORDER — IOPAMIDOL (ISOVUE-300) INJECTION 61%
INTRAVENOUS | Status: AC
  Administered 2016-11-17: 1 mL
  Filled 2016-11-17: qty 100

## 2016-11-17 MED ORDER — SODIUM CHLORIDE 0.9 % IV SOLN
INTRAVENOUS | Status: AC
  Administered 2016-11-17: 01:00:00 via INTRAVENOUS

## 2016-11-17 MED ORDER — ACETAMINOPHEN 650 MG RE SUPP: 650.0000 mg | Freq: Four times a day (QID) | RECTAL | Status: DC | PRN

## 2016-11-17 MED ORDER — SODIUM CHLORIDE 0.9% FLUSH
3.0000 mL | Freq: Two times a day (BID) | INTRAVENOUS | Status: DC
  Administered 2016-11-17 – 2016-11-21 (×6): 3 mL via INTRAVENOUS

## 2016-11-17 MED ORDER — WARFARIN - PHARMACIST DOSING INPATIENT
Freq: Every day | Status: DC
  Administered 2016-11-20: 18:00:00

## 2016-11-17 MED ORDER — ONDANSETRON HCL 4 MG/2ML IJ SOLN: 4.0000 mg | Freq: Four times a day (QID) | INTRAMUSCULAR | Status: DC | PRN

## 2016-11-17 MED ORDER — SENNA 8.6 MG PO TABS
1.0000 | ORAL_TABLET | Freq: Two times a day (BID) | ORAL | Status: DC
  Filled 2016-11-17 (×3): qty 1

## 2016-11-17 MED ORDER — HYDROCODONE-ACETAMINOPHEN 5-325 MG PO TABS: 1.0000 | ORAL_TABLET | ORAL | Status: DC | PRN

## 2016-11-17 MED ORDER — IOPAMIDOL (ISOVUE-300) INJECTION 61%: 100.0000 mL | Freq: Once | INTRAVENOUS | Status: DC | PRN

## 2016-11-17 MED ORDER — ENSURE ENLIVE PO LIQD
237.0000 mL | Freq: Two times a day (BID) | ORAL | Status: DC
  Administered 2016-11-17 – 2016-11-21 (×7): 237 mL via ORAL

## 2016-11-17 MED ORDER — POLYETHYLENE GLYCOL 3350 17 G PO PACK
17.0000 g | PACK | Freq: Every day | ORAL | Status: DC | PRN
  Administered 2016-11-17: 17 g via ORAL
  Filled 2016-11-17 (×3): qty 1

## 2016-11-17 MED ORDER — ONDANSETRON HCL 4 MG PO TABS: 4.0000 mg | ORAL_TABLET | Freq: Four times a day (QID) | ORAL | Status: DC | PRN

## 2016-11-17 MED ORDER — ACETAMINOPHEN 325 MG PO TABS
650.0000 mg | ORAL_TABLET | Freq: Four times a day (QID) | ORAL | Status: DC | PRN
  Administered 2016-11-17: 325 mg via ORAL
  Administered 2016-11-17: 650 mg via ORAL
  Filled 2016-11-17 (×2): qty 2

## 2016-11-17 MED ORDER — CALCIUM CARBONATE 1500 (600 CA) MG PO TABS
600.0000 mg | ORAL_TABLET | Freq: Every day | ORAL | Status: DC
  Administered 2016-11-18: 1500 mg via ORAL
  Filled 2016-11-17 (×2): qty 1

## 2016-11-17 NOTE — Progress Notes (Signed)
  Echocardiogram 2D Echocardiogram has been performed.  Bobbye Charleston 11/17/2016, 2:24 PM

## 2016-11-17 NOTE — Consult Note (Signed)
[facility] CONSULT NOTE  Patient Care Team: Pcp Not In System as PCP - General  CHIEF COMPLAINTS/PURPOSE OF CONSULTATION:  Leukocytosis and thrombocytosis  HISTORY OF PRESENTING ILLNESS:  Vanessa Page 83 y.o. female is seen because of abnormal CBC She is an excellent historian. This patient has chronic bronchiectasis.  I have the opportunity to review his CBC data back from 2015 and she has chronic leukocytosis since then. She follows with a hematologist because of history of DVT with background history of protein C deficiency. We do not have her outside records related to his CBC over the past 3 years The patient is in excellent health up until she underwent screening colonoscopy around October 21, 2016. Prior to that, the patient walks on treadmill at least 3 times a week, exercises at least several miles each time Approximately 10 days afterwards, she started to feel unwell. She came here to visit her son and was briefly admitted to the hospital in February with diffuse myalgias, arthralgias and poor appetite.  Prior to that, she was prescribed Bactrim antibiotics for unspecified infection. Her husband commented that she had small leg ulcer that was slowly improving on antibiotics. She was subsequently discharged to home health but continues to decline.  Her antibiotics was switched to Keflex upon recent discharge. Prior to readmission to the hospital, she has some low-grade fever and chills at night She denies night sweats Repeat CBC yesterday showed white count of 29.5 with associated thrombocytosis. Since she was prescribed IV antibiotics, she felt that the fever had resolved.  She denies further chills. She denies recent cough, diarrhea, dysuria, frequency or urgency. She denies joint pain no abnormal skin rashes  She had no prior history or diagnosis of cancer. Her age appropriate screening programs are up-to-date. The patient has no prior diagnosis of  autoimmune disease and was not prescribed corticosteroids related products. The patient is a non-smoker. MRI of the brain from 11/01/2016 show mastoid effusion. CT scan of the abdomen showed no evidence of splenomegaly or lymphadenopathy.  Incidental bronchiectasis is noted on CT scan Echocardiogram show increased RV pressure but no evidence of valvular vegetation  MEDICAL HISTORY:  Past Medical History:  Diagnosis Date  . Aortic regurgitation 12/24/2013   moderate  . Bronchiectasis ([place])   . Bursitis of right shoulder   . DVT (deep venous thrombosis) ([place])   . 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 Mitral regurgitation 12/24/2013   mild    SURGICAL HISTORY: Past Surgical History:  Procedure Laterality Date  . INTRAMEDULLARY (IM) NAIL INTERTROCHANTERIC Left 12/24/2013   Procedure: INTRAMEDULLARY (IM) NAIL INTERTROCHANTRIC;  Surgeon: Mcarthur Rossetti, MD;  Location: [facility];  Service: Orthopedics;  Laterality: Left;  . THYROIDECTOMY, PARTIAL    . TONSILLECTOMY      SOCIAL HISTORY: Social History   Social History  . Marital status: Married    Spouse name: N/A  . Number of children: N/A  . Years of education: N/A   Occupational History  . Not on file.   Social History Main Topics  . Smoking status: Never Smoker  . Smokeless tobacco: Never Used  . Alcohol use No     Comment: occasional wine   . Drug use: No  . Sexual activity: Not on file   Other Topics Concern  . Not on file   Social History Narrative  . No narrative on file    FAMILY HISTORY: Family History  Problem Relation Age  of Onset  . Prostate cancer Father     ALLERGIES:  is allergic to other and codeine.  MEDICATIONS:  Current Facility-Administered Medications  Medication Dose Route Frequency Provider Last Rate Last Dose  . acetaminophen (TYLENOL) tablet 650 mg  650 mg Oral Q6H PRN Toy Baker, MD   650 mg at 11/17/16 0105   Or  .  acetaminophen (TYLENOL) suppository 650 mg  650 mg Rectal Q6H PRN Toy Baker, MD      . calcium carbonate (OSCAL) tablet 1,500 mg  600 mg of elemental calcium Oral Q breakfast Irine Seal V, MD      . feeding supplement (ENSURE ENLIVE) (ENSURE ENLIVE) liquid 237 mL  237 mL Oral BID BM Eugenie Filler, MD   237 mL at 11/17/16 1400  . fluticasone (FLONASE) 50 MCG/ACT nasal spray 2 spray  2 spray Each Nare Daily Eugenie Filler, MD   2 spray at 11/17/16 1000  . HYDROcodone-acetaminophen (NORCO/VICODIN) 5-325 MG per tablet 1-2 tablet  1-2 tablet Oral Q4H PRN Toy Baker, MD      . iopamidol (ISOVUE-300) 61 % injection 100 mL  100 mL Intravenous Once PRN Toy Baker, MD      . ondansetron (ZOFRAN) tablet 4 mg  4 mg Oral Q6H PRN Toy Baker, MD       Or  . ondansetron (ZOFRAN) injection 4 mg  4 mg Intravenous Q6H PRN Toy Baker, MD      . piperacillin-tazobactam (ZOSYN) IVPB 3.375 g  3.375 g Intravenous Q8H Rachel L Rumbarger, RPH   3.375 g at 11/17/16 1800  . polyethylene glycol (MIRALAX / GLYCOLAX) packet 17 g  17 g Oral Daily PRN Toy Baker, MD   17 g at 11/17/16 1352  . senna (SENOKOT) tablet 8.6 mg  1 tablet Oral BID Toy Baker, MD      . sodium chloride flush (NS) 0.9 % injection 3 mL  3 mL Intravenous Q12H Toy Baker, MD      . vancomycin (VANCOCIN) IVPB 750 mg/150 ml premix  750 mg Intravenous Q24H Rachel L Rumbarger, RPH      . Warfarin - Pharmacist Dosing Inpatient   Does not apply q1800 Laren Everts, [facility]   Stopped at 11/17/16 1800    REVIEW OF SYSTEMS:   Eyes: Denies blurriness of vision, double vision or watery eyes Ears, nose, mouth, throat, and face: Denies mucositis or sore throat Respiratory: Denies cough, dyspnea or wheezes Cardiovascular: Denies palpitation, chest discomfort or lower extremity swelling Gastrointestinal:  Denies nausea, heartburn or change in bowel habits Skin: Denies abnormal skin  rashes Lymphatics: Denies new lymphadenopathy or easy bruising Neurological:Denies numbness, tingling Behavioral/Psych: Mood is stable, no new changes  All other systems were reviewed with the patient and are negative.  PHYSICAL EXAMINATION: ECOG PERFORMANCE STATUS: 2 - Symptomatic, <50% confined to bed  Vitals:   11/17/16 0031 11/17/16 0332  BP: 123/60 (!) 113/53  Pulse: (!) 101 (!) 110  Resp: 20 20  Temp: 98.3 F (36.8 C) 98.1 F (36.7 C)   Filed Weights   11/16/16 1817 11/17/16 0031  Weight: 124 lb (56.2 kg) 126 lb 3.2 oz (57.2 kg)    GENERAL:alert, no distress and comfortable SKIN: skin color, texture, turgor are normal, no rashes or significant lesions EYES: normal, conjunctiva are pink and non-injected, sclera clear OROPHARYNX:no exudate, no erythema and lips, buccal mucosa, and tongue normal  NECK: supple, thyroid normal size, non-tender, without nodularity LYMPH:  no palpable lymphadenopathy in the cervical,  axillary or inguinal LUNGS: clear to auscultation and percussion with normal breathing effort HEART: regular rate & rhythm and no murmurs and no lower extremity edema ABDOMEN:abdomen soft, non-tender and normal bowel sounds Musculoskeletal:no cyanosis of digits and no clubbing  PSYCH: alert & oriented x 3 with fluent speech NEURO: no focal motor/sensory deficits  LABORATORY DATA:  I have reviewed the data as listed Recent Results (from the past 2160 hour(s))  Comprehensive metabolic panel     Status: Abnormal   Collection Time: 11/01/16  7:40 AM  Result Value Ref Range   Sodium 132 (L) 135 - 145 mmol/L   Potassium 3.8 3.5 - 5.1 mmol/L   Chloride 98 (L) 101 - 111 mmol/L   CO2 25 22 - 32 mmol/L   Glucose, Bld 118 (H) 65 - 99 mg/dL   BUN 12 6 - 20 mg/dL   Creatinine, Ser 0.70 0.44 - 1.00 mg/dL   Calcium 8.1 (L) 8.9 - 10.3 mg/dL   Total Protein 6.2 (L) 6.5 - 8.1 g/dL   Albumin 2.5 (L) 3.5 - 5.0 g/dL   AST 53 (H) 15 - 41 U/L   ALT 43 14 - 54 U/L   Alkaline  Phosphatase 96 38 - 126 U/L   Total Bilirubin 0.9 0.3 - 1.2 mg/dL   GFR calc non Af Amer >60 >60 mL/min   GFR calc Af Amer >60 >60 mL/min    Comment: (NOTE) The eGFR has been calculated using the CKD EPI equation. This calculation has not been validated in all clinical situations. eGFR's persistently <60 mL/min signify possible Chronic Kidney Disease.    Anion gap 9 5 - 15  Lipase, blood     Status: None   Collection Time: 11/01/16  7:40 AM  Result Value Ref Range   Lipase 20 11 - 51 U/L  Troponin I     Status: None   Collection Time: 11/01/16  7:40 AM  Result Value Ref Range   Troponin I <0.03 <0.03 ng/mL  CBC with Differential     Status: Abnormal   Collection Time: 11/01/16  7:40 AM  Result Value Ref Range   WBC 22.3 (H) 4.0 - 10.5 K/uL   RBC 4.81 3.87 - 5.11 MIL/uL   Hemoglobin 13.7 12.0 - 15.0 g/dL   HCT 40.1 36.0 - 46.0 %   MCV 83.4 78.0 - 100.0 fL   MCH 28.5 26.0 - 34.0 pg   MCHC 34.2 30.0 - 36.0 g/dL   RDW 14.2 11.5 - 15.5 %   Platelets 484 (H) 150 - 400 K/uL   Neutrophils Relative % 84 %   Neutro Abs 18.7 (H) 1.7 - 7.7 K/uL   Lymphocytes Relative 7 %   Lymphs Abs 1.6 0.7 - 4.0 K/uL   Monocytes Relative 8 %   Monocytes Absolute 1.7 (H) 0.1 - 1.0 K/uL   Eosinophils Relative 1 %   Eosinophils Absolute 0.3 0.0 - 0.7 K/uL   Basophils Relative 0 %   Basophils Absolute 0.1 0.0 - 0.1 K/uL   WBC Morphology TOXIC GRANULATION   CK     Status: Abnormal   Collection Time: 11/01/16  7:40 AM  Result Value Ref Range   Total CK 24 (L) 38 - 234 U/L  Urinalysis, Routine w reflex microscopic     Status: Abnormal   Collection Time: 11/01/16  7:44 AM  Result Value Ref Range   Color, Urine AMBER (A) YELLOW    Comment: BIOCHEMICALS MAY BE AFFECTED BY COLOR   APPearance  CLOUDY (A) CLEAR   Specific Gravity, Urine 1.022 1.005 - 1.030   pH 6.0 5.0 - 8.0   Glucose, UA NEGATIVE NEGATIVE mg/dL   Hgb urine dipstick SMALL (A) NEGATIVE   Bilirubin Urine SMALL (A) NEGATIVE   Ketones,  ur 15 (A) NEGATIVE mg/dL   Protein, ur NEGATIVE NEGATIVE mg/dL   Nitrite NEGATIVE NEGATIVE   Leukocytes, UA SMALL (A) NEGATIVE  Urinalysis, Microscopic (reflex)     Status: Abnormal   Collection Time: 11/01/16  7:44 AM  Result Value Ref Range   RBC / HPF 0-5 0 - 5 RBC/hpf   WBC, UA 6-30 0 - 5 WBC/hpf   Bacteria, UA MANY (A) NONE SEEN   Squamous Epithelial / LPF 6-30 (A) NONE SEEN   Mucous PRESENT   Urine culture     Status: Abnormal   Collection Time: 11/01/16  7:44 AM  Result Value Ref Range   Specimen Description URINE, RANDOM    Special Requests NONE    Culture (A)     <10,000 COLONIES/mL INSIGNIFICANT GROWTH Performed at [facility], [address] [place], [place] [address]    Report Status 11/02/2016 FINAL   Influenza panel by PCR (type A & B)     Status: None   Collection Time: 11/01/16 10:27 AM  Result Value Ref Range   Influenza A By PCR NEGATIVE NEGATIVE   Influenza B By PCR NEGATIVE NEGATIVE    Comment: (NOTE) The Xpert Xpress Flu assay is intended as an aid in the diagnosis of  influenza and should not be used as a sole basis for treatment.  This  assay is FDA approved for nasopharyngeal swab specimens only. Nasal  washings and aspirates are unacceptable for Xpert Xpress Flu testing. Performed at [facility], [place] [address], [place], [place] [address]   Culture, blood (routine x 2)     Status: None   Collection Time: 11/01/16 11:00 AM  Result Value Ref Range   Specimen Description BLOOD RIGHT WRIST    Special Requests BOTTLES DRAWN AEROBIC AND ANAEROBIC 4CC EACH    Culture      NO GROWTH 5 DAYS Performed at [facility], [place] [address], [place], [place] [address]    Report Status 11/06/2016 FINAL   Protime-INR     Status: Abnormal   Collection Time: 11/01/16 11:15 AM  Result Value Ref Range   Prothrombin Time 26.8 (H) 11.4 - 15.2 seconds   INR 2.42   Culture, blood (routine x 2)     Status: None   Collection Time: 11/01/16 11:15 AM   Result Value Ref Range   Specimen Description BLOOD LEFT WRIST    Special Requests BOTTLES DRAWN AEROBIC AND ANAEROBIC 5CC EACH    Culture      NO GROWTH 5 DAYS Performed at [facility], [place] [address] [place], [place] [address]    Report Status 11/06/2016 FINAL   I-Stat CG4 Lactic Acid, ED     Status: Abnormal   Collection Time: 11/01/16 11:28 AM  Result Value Ref Range   Lactic Acid, Venous 2.36 (HH) 0.5 - 1.9 mmol/L   Comment NOTIFIED PHYSICIAN   Comprehensive metabolic panel     Status: Abnormal   Collection Time: 11/02/16  2:37 AM  Result Value Ref Range   Sodium 136 135 - 145 mmol/L   Potassium 3.4 (L) 3.5 - 5.1 mmol/L   Chloride 106 101 - 111 mmol/L   CO2 25 22 -  32 mmol/L   Glucose, Bld 115 (H) 65 - 99 mg/dL   BUN 7 6 - 20 mg/dL   Creatinine, Ser 0.60 0.44 - 1.00 mg/dL   Calcium 7.3 (L) 8.9 - 10.3 mg/dL   Total Protein 4.8 (L) 6.5 - 8.1 g/dL   Albumin 1.8 (L) 3.5 - 5.0 g/dL   AST 68 (H) 15 - 41 U/L   ALT 52 14 - 54 U/L   Alkaline Phosphatase 82 38 - 126 U/L   Total Bilirubin 0.6 0.3 - 1.2 mg/dL   GFR calc non Af Amer >60 >60 mL/min   GFR calc Af Amer >60 >60 mL/min    Comment: (NOTE) The eGFR has been calculated using the CKD EPI equation. This calculation has not been validated in all clinical situations. eGFR's persistently <60 mL/min signify possible Chronic Kidney Disease.    Anion gap 5 5 - 15  CBC     Status: Abnormal   Collection Time: 11/02/16  2:37 AM  Result Value Ref Range   WBC 19.9 (H) 4.0 - 10.5 K/uL   RBC 4.20 3.87 - 5.11 MIL/uL   Hemoglobin 11.7 (L) 12.0 - 15.0 g/dL   HCT 35.7 (L) 36.0 - 46.0 %   MCV 85.0 78.0 - 100.0 fL   MCH 27.9 26.0 - 34.0 pg   MCHC 32.8 30.0 - 36.0 g/dL   RDW 14.4 11.5 - 15.5 %   Platelets 395 150 - 400 K/uL  TSH     Status: None   Collection Time: 11/02/16  2:37 AM  Result Value Ref Range   TSH 1.250 0.350 - 4.500 uIU/mL    Comment: Performed by a 3rd Generation assay with a functional sensitivity of  <=0.01 uIU/mL.  Sedimentation rate     Status: Abnormal   Collection Time: 11/02/16  2:37 AM  Result Value Ref Range   Sed Rate 42 (H) 0 - 22 mm/hr  Antinuclear Antibodies, IFA     Status: None   Collection Time: 11/02/16  2:37 AM  Result Value Ref Range   ANA Ab, IFA Negative     Comment: (NOTE)                                     Negative   <1:80                                     Borderline  1:80                                     Positive   >1:80 Performed At: [facility] [place], [place] [id number] Lindon Romp MD [id number]   CK total and CKMB (cardiac)not at [facility]     Status: Abnormal   Collection Time: 11/02/16  2:37 AM  Result Value Ref Range   Total CK 31 (L) 38 - 234 U/L   CK, MB 2.9 0.5 - 5.0 ng/mL   Relative Index RELATIVE INDEX IS INVALID 0.0 - 2.5    Comment: WHEN CK < 100 U/L          Lactic acid, plasma     Status: None   Collection Time: 11/02/16  2:37 AM  Result Value Ref Range   Lactic  Acid, Venous 1.4 0.5 - 1.9 mmol/L  Acetylcholine Receptor Ab, All     Status: None   Collection Time: 11/02/16  2:37 AM  Result Value Ref Range   Acety choline binding ab <0.03 0.00 - 0.24 nmol/L    Comment: (NOTE)                               Negative:   0.00 - 0.24                               Borderline: 0.25 - 0.40                               Positive:        > 0.40    Acetylchol Block Ab 21 0 - 25 %    Comment: (NOTE)                               Negative:      0 - 25                               Borderline:   26 - 30                               Positive:         >30 Results for this test are for research purposes only by the assay's manufacturer.  The performance characteristics of this product have not been established.  Results should not be used as a diagnostic procedure without confirmation of the diagnosis by another medically established diagnostic product or procedure.    Acetylcholine Modulat Ab <12 0 - 20 %     Comment: (NOTE)                              Negative:          <21                              Equivocal:     21 - 25                              Positive:          >25 The assay is linear between values of 12 and 64. Those <12 and >64 are reported as such.  No single value for ACR-modulating antibody should be used as a sole basis for diagnosis or response to therapy. Performed At: [facility] [place], [place] [id number] Lindon Romp MD [id number]   Protime-INR     Status: Abnormal   Collection Time: 11/02/16 12:27 PM  Result Value Ref Range   Prothrombin Time 35.5 (H) 11.4 - 15.2 seconds   INR 3.45   Protime-INR     Status: Abnormal   Collection Time: 11/03/16  7:00 AM  Result Value Ref Range   Prothrombin Time 25.8 (H) 11.4 - 15.2 seconds   INR 2.31   CBC  Status: Abnormal   Collection Time: 11/03/16  7:00 AM  Result Value Ref Range   WBC 19.1 (H) 4.0 - 10.5 K/uL   RBC 4.44 3.87 - 5.11 MIL/uL   Hemoglobin 12.4 12.0 - 15.0 g/dL   HCT 37.4 36.0 - 46.0 %   MCV 84.2 78.0 - 100.0 fL   MCH 27.9 26.0 - 34.0 pg   MCHC 33.2 30.0 - 36.0 g/dL   RDW 14.4 11.5 - 15.5 %   Platelets 478 (H) 150 - 400 K/uL  Basic metabolic panel     Status: Abnormal   Collection Time: 11/03/16  7:00 AM  Result Value Ref Range   Sodium 134 (L) 135 - 145 mmol/L   Potassium 3.1 (L) 3.5 - 5.1 mmol/L   Chloride 98 (L) 101 - 111 mmol/L   CO2 25 22 - 32 mmol/L   Glucose, Bld 112 (H) 65 - 99 mg/dL   BUN 7 6 - 20 mg/dL   Creatinine, Ser 0.60 0.44 - 1.00 mg/dL   Calcium 7.7 (L) 8.9 - 10.3 mg/dL   GFR calc non Af Amer >60 >60 mL/min   GFR calc Af Amer >60 >60 mL/min    Comment: (NOTE) The eGFR has been calculated using the CKD EPI equation. This calculation has not been validated in all clinical situations. eGFR's persistently <60 mL/min signify possible Chronic Kidney Disease.    Anion gap 11 5 - 15  Comprehensive metabolic panel     Status: Abnormal   Collection  Time: 11/16/16  6:20 PM  Result Value Ref Range   Sodium 132 (L) 135 - 145 mmol/L   Potassium 4.1 3.5 - 5.1 mmol/L   Chloride 93 (L) 101 - 111 mmol/L   CO2 26 22 - 32 mmol/L   Glucose, Bld 133 (H) 65 - 99 mg/dL   BUN 18 6 - 20 mg/dL   Creatinine, Ser 0.95 0.44 - 1.00 mg/dL   Calcium 8.3 (L) 8.9 - 10.3 mg/dL   Total Protein 6.1 (L) 6.5 - 8.1 g/dL   Albumin 2.2 (L) 3.5 - 5.0 g/dL   AST 36 15 - 41 U/L   ALT 26 14 - 54 U/L   Alkaline Phosphatase 120 38 - 126 U/L   Total Bilirubin 0.6 0.3 - 1.2 mg/dL   GFR calc non Af Amer 54 (L) >60 mL/min   GFR calc Af Amer >60 >60 mL/min    Comment: (NOTE) The eGFR has been calculated using the CKD EPI equation. This calculation has not been validated in all clinical situations. eGFR's persistently <60 mL/min signify possible Chronic Kidney Disease.    Anion gap 13 5 - 15  CBC with Differential     Status: Abnormal   Collection Time: 11/16/16  6:20 PM  Result Value Ref Range   WBC 29.5 (H) 4.0 - 10.5 K/uL   RBC 4.51 3.87 - 5.11 MIL/uL   Hemoglobin 12.5 12.0 - 15.0 g/dL   HCT 37.0 36.0 - 46.0 %   MCV 82.0 78.0 - 100.0 fL   MCH 27.7 26.0 - 34.0 pg   MCHC 33.8 30.0 - 36.0 g/dL   RDW 15.1 11.5 - 15.5 %   Platelets 515 (H) 150 - 400 K/uL   Neutrophils Relative % 85 %   Lymphocytes Relative 7 %   Monocytes Relative 7 %   Eosinophils Relative 1 %   Basophils Relative 0 %   Neutro Abs 25.0 (H) 1.7 - 7.7 K/uL   Lymphs Abs 2.1 0.7 -  4.0 K/uL   Monocytes Absolute 2.1 (H) 0.1 - 1.0 K/uL   Eosinophils Absolute 0.3 0.0 - 0.7 K/uL   Basophils Absolute 0.0 0.0 - 0.1 K/uL   RBC Morphology POLYCHROMASIA PRESENT     Comment: TARGET CELLS   WBC Morphology ATYPICAL LYMPHOCYTES   Blood Culture (routine x 2)     Status: None (Preliminary result)   Collection Time: 11/16/16  6:25 PM  Result Value Ref Range   Specimen Description BLOOD RIGHT HAND    Special Requests BOTTLES DRAWN AEROBIC ONLY 5CC    Culture NO GROWTH < 24 HOURS    Report Status PENDING    I-Stat CG4 Lactic Acid, ED     Status: Abnormal   Collection Time: 11/16/16  6:34 PM  Result Value Ref Range   Lactic Acid, Venous 2.48 (HH) 0.5 - 1.9 mmol/L  Blood Culture (routine x 2)     Status: None (Preliminary result)   Collection Time: 11/16/16  6:55 PM  Result Value Ref Range   Specimen Description BLOOD LEFT HAND    Special Requests BOTTLES DRAWN AEROBIC ONLY 10CC    Culture NO GROWTH < 24 HOURS    Report Status PENDING   Urinalysis, Routine w reflex microscopic     Status: Abnormal   Collection Time: 11/16/16  7:39 PM  Result Value Ref Range   Color, Urine YELLOW YELLOW   APPearance HAZY (A) CLEAR   Specific Gravity, Urine 1.024 1.005 - 1.030   pH 5.0 5.0 - 8.0   Glucose, UA NEGATIVE NEGATIVE mg/dL   Hgb urine dipstick SMALL (A) NEGATIVE   Bilirubin Urine NEGATIVE NEGATIVE   Ketones, ur NEGATIVE NEGATIVE mg/dL   Protein, ur 100 (A) NEGATIVE mg/dL   Nitrite NEGATIVE NEGATIVE   Leukocytes, UA SMALL (A) NEGATIVE   RBC / HPF 6-30 0 - 5 RBC/hpf   WBC, UA 6-30 0 - 5 WBC/hpf   Bacteria, UA NONE SEEN NONE SEEN   Squamous Epithelial / LPF 0-5 (A) NONE SEEN   Mucous PRESENT   Gram stain     Status: None   Collection Time: 11/16/16  7:39 PM  Result Value Ref Range   Specimen Description URINE, CATHETERIZED    Special Requests NONE    Gram Stain      SQUAMOUS EPITHELIAL CELLS PRESENT WBC PRESENT, PREDOMINANTLY PMN GRAM POSITIVE RODS GRAM POSITIVE COCCI IN PAIRS CYTOSPIN SMEAR    Report Status 11/16/2016 FINAL   Urine culture     Status: None   Collection Time: 11/16/16  7:39 PM  Result Value Ref Range   Specimen Description URINE, CATHETERIZED    Special Requests NONE    Culture NO GROWTH    Report Status 11/17/2016 FINAL   Protime-INR     Status: Abnormal   Collection Time: 11/16/16  9:53 PM  Result Value Ref Range   Prothrombin Time 32.9 (H) 11.4 - 15.2 seconds   INR 3.13   Brain natriuretic peptide     Status: Abnormal   Collection Time: 11/16/16  9:53 PM   Result Value Ref Range   B Natriuretic Peptide 196.7 (H) 0.0 - 100.0 pg/mL  Respiratory Panel by PCR     Status: None   Collection Time: 11/16/16  9:55 PM  Result Value Ref Range   Adenovirus NOT DETECTED NOT DETECTED   Coronavirus 229E NOT DETECTED NOT DETECTED   Coronavirus HKU1 NOT DETECTED NOT DETECTED   Coronavirus NL63 NOT DETECTED NOT DETECTED   Coronavirus OC43 NOT DETECTED  NOT DETECTED   Metapneumovirus NOT DETECTED NOT DETECTED   Rhinovirus / Enterovirus NOT DETECTED NOT DETECTED   Influenza A NOT DETECTED NOT DETECTED   Influenza B NOT DETECTED NOT DETECTED   Parainfluenza Virus 1 NOT DETECTED NOT DETECTED   Parainfluenza Virus 2 NOT DETECTED NOT DETECTED   Parainfluenza Virus 3 NOT DETECTED NOT DETECTED   Parainfluenza Virus 4 NOT DETECTED NOT DETECTED   Respiratory Syncytial Virus NOT DETECTED NOT DETECTED   Bordetella pertussis NOT DETECTED NOT DETECTED   Chlamydophila pneumoniae NOT DETECTED NOT DETECTED   Mycoplasma pneumoniae NOT DETECTED NOT DETECTED  I-Stat CG4 Lactic Acid, ED     Status: None   Collection Time: 11/16/16 10:00 PM  Result Value Ref Range   Lactic Acid, Venous 1.28 0.5 - 1.9 mmol/L  Creatinine, urine, random     Status: None   Collection Time: 11/16/16 10:17 PM  Result Value Ref Range   Creatinine, Urine 174.42 mg/dL  Sodium, urine, random     Status: None   Collection Time: 11/16/16 10:17 PM  Result Value Ref Range   Sodium, Ur 31 mmol/L  Osmolality, urine     Status: None   Collection Time: 11/16/16 10:18 PM  Result Value Ref Range   Osmolality, Ur 751 300 - 900 mOsm/kg  Procalcitonin - Baseline     Status: None   Collection Time: 11/16/16 10:26 PM  Result Value Ref Range   Procalcitonin 0.45 ng/mL    Comment:        Interpretation: PCT (Procalcitonin) <= 0.5 ng/mL: Systemic infection (sepsis) is not likely. Local bacterial infection is possible. (NOTE)         ICU PCT Algorithm               Non ICU PCT Algorithm     ----------------------------     ------------------------------         PCT < 0.25 ng/mL                 PCT < 0.1 ng/mL     Stopping of antibiotics            Stopping of antibiotics       strongly encouraged.               strongly encouraged.    ----------------------------     ------------------------------       PCT level decrease by               PCT < 0.25 ng/mL       >= 80% from peak PCT       OR PCT 0.25 - 0.5 ng/mL          Stopping of antibiotics                                             encouraged.     Stopping of antibiotics           encouraged.    ----------------------------     ------------------------------       PCT level decrease by              PCT >= 0.25 ng/mL       < 80% from peak PCT        AND PCT >= 0.5 ng/mL  Continuin g antibiotics                                              encouraged.       Continuing antibiotics            encouraged.    ----------------------------     ------------------------------     PCT level increase compared          PCT > 0.5 ng/mL         with peak PCT AND          PCT >= 0.5 ng/mL             Escalation of antibiotics                                          strongly encouraged.      Escalation of antibiotics        strongly encouraged.   Sedimentation rate     Status: Abnormal   Collection Time: 11/16/16 11:41 PM  Result Value Ref Range   Sed Rate 70 (H) 0 - 22 mm/hr  CK     Status: Abnormal   Collection Time: 11/16/16 11:41 PM  Result Value Ref Range   Total CK 20 (L) 38 - 234 U/L  Troponin I (q 6hr x 3)     Status: Abnormal   Collection Time: 11/16/16 11:41 PM  Result Value Ref Range   Troponin I 0.06 (HH) <0.03 ng/mL    Comment: CRITICAL RESULT CALLED TO, READ BACK BY AND VERIFIED WITH: BOWEN,A RN 11/17/2016 0119 JORDANS   Lactate dehydrogenase     Status: None   Collection Time: 11/16/16 11:41 PM  Result Value Ref Range   LDH 137 98 - 192 U/L  TSH     Status: None   Collection Time: 11/17/16   4:49 AM  Result Value Ref Range   TSH 2.072 0.350 - 4.500 uIU/mL    Comment: Performed by a 3rd Generation assay with a functional sensitivity of <=0.01 uIU/mL.  CBC with Differential     Status: Abnormal   Collection Time: 11/17/16  4:49 AM  Result Value Ref Range   WBC 26.9 (H) 4.0 - 10.5 K/uL    Comment: REPEATED TO VERIFY   RBC 3.75 (L) 3.87 - 5.11 MIL/uL   Hemoglobin 10.2 (L) 12.0 - 15.0 g/dL    Comment: REPEATED TO VERIFY   HCT 30.6 (L) 36.0 - 46.0 %   MCV 81.6 78.0 - 100.0 fL   MCH 27.2 26.0 - 34.0 pg   MCHC 33.3 30.0 - 36.0 g/dL   RDW 15.1 11.5 - 15.5 %   Platelets 474 (H) 150 - 400 K/uL    Comment: REPEATED TO VERIFY   Neutrophils Relative % 83 %   Lymphocytes Relative 8 %   Monocytes Relative 7 %   Eosinophils Relative 2 %   Basophils Relative 0 %   Neutro Abs 22.3 (H) 1.7 - 7.7 K/uL   Lymphs Abs 2.2 0.7 - 4.0 K/uL   Monocytes Absolute 1.9 (H) 0.1 - 1.0 K/uL   Eosinophils Absolute 0.5 0.0 - 0.7 K/uL   Basophils Absolute 0.0 0.0 - 0.1 K/uL   WBC Morphology MILD LEFT SHIFT (1-5% METAS, OCC MYELO,  OCC BANDS)   Prealbumin     Status: Abnormal   Collection Time: 11/17/16  4:49 AM  Result Value Ref Range   Prealbumin <5 (L) 18 - 38 mg/dL  Troponin I (q 6hr x 3)     Status: Abnormal   Collection Time: 11/17/16  4:50 AM  Result Value Ref Range   Troponin I 0.03 (HH) <0.03 ng/mL    Comment: CRITICAL VALUE NOTED.  VALUE IS CONSISTENT WITH PREVIOUSLY REPORTED AND CALLED VALUE.  Protime-INR     Status: Abnormal   Collection Time: 11/17/16  4:50 AM  Result Value Ref Range   Prothrombin Time 34.8 (H) 11.4 - 15.2 seconds   INR 3.36   Magnesium     Status: None   Collection Time: 11/17/16  4:50 AM  Result Value Ref Range   Magnesium 1.7 1.7 - 2.4 mg/dL  Phosphorus     Status: None   Collection Time: 11/17/16  4:50 AM  Result Value Ref Range   Phosphorus 3.0 2.5 - 4.6 mg/dL  Comprehensive metabolic panel     Status: Abnormal   Collection Time: 11/17/16  4:50 AM  Result  Value Ref Range   Sodium 132 (L) 135 - 145 mmol/L   Potassium 3.2 (L) 3.5 - 5.1 mmol/L    Comment: DELTA CHECK NOTED   Chloride 99 (L) 101 - 111 mmol/L   CO2 25 22 - 32 mmol/L   Glucose, Bld 117 (H) 65 - 99 mg/dL   BUN 12 6 - 20 mg/dL   Creatinine, Ser 0.80 0.44 - 1.00 mg/dL   Calcium 7.1 (L) 8.9 - 10.3 mg/dL   Total Protein 4.9 (L) 6.5 - 8.1 g/dL   Albumin 1.6 (L) 3.5 - 5.0 g/dL   AST 44 (H) 15 - 41 U/L   ALT 27 14 - 54 U/L   Alkaline Phosphatase 118 38 - 126 U/L   Total Bilirubin 0.8 0.3 - 1.2 mg/dL   GFR calc non Af Amer >60 >60 mL/min   GFR calc Af Amer >60 >60 mL/min    Comment: (NOTE) The eGFR has been calculated using the CKD EPI equation. This calculation has not been validated in all clinical situations. eGFR's persistently <60 mL/min signify possible Chronic Kidney Disease.    Anion gap 8 5 - 15  Troponin I (q 6hr x 3)     Status: Abnormal   Collection Time: 11/17/16 11:33 AM  Result Value Ref Range   Troponin I 0.04 (HH) <0.03 ng/mL    Comment: CRITICAL VALUE NOTED.  VALUE IS CONSISTENT WITH PREVIOUSLY REPORTED AND CALLED VALUE.  Echocardiogram     Status: None   Collection Time: 11/17/16  2:24 PM  Result Value Ref Range   Weight 2,019.2 oz   Height 60 in   BP 113/53 mmHg    RADIOGRAPHIC STUDIES: I have personally reviewed the radiological images as listed and agreed with the findings in the report. Dg Chest 2 View  Result Date: 11/16/2016 CLINICAL DATA:  83 year old female with history of shortness of breath for the past 2 weeks, worsening on exertion. History bronchiectasis. EXAM: CHEST  2 VIEW COMPARISON:  Chest x-ray 11/01/2016. FINDINGS: No consolidative airspace disease. Small left pleural effusion blunting the left costophrenic sulcus. No right pleural effusion. No evidence of pulmonary edema. No definite suspicious appearing pulmonary nodules or masses. Heart size is normal. The patient is rotated to the left on today's exam, resulting in distortion of the  mediastinal contours and reduced diagnostic sensitivity  and specificity for mediastinal pathology. Atherosclerosis in the thoracic aorta. Bilateral apical pleuroparenchymal thickening, partially calcified, most compatible with chronic post infectious or inflammatory scarring. IMPRESSION: 1. Small left pleural effusion. 2. Aortic atherosclerosis. Electronically Signed   By: Vinnie Langton M.D.   On: 11/16/2016 20:21   Dg Chest 2 View  Result Date: 11/01/2016 CLINICAL DATA:  Generalized body aches. EXAM: CHEST  2 VIEW COMPARISON:  Chest x-ray 01/09/2014 FINDINGS: The heart size is upper limits of normal. Atherosclerotic changes are present at the aortic arch. Changes COPD are again seen. There is no edema or effusion. No focal airspace disease is present. IMPRESSION: 1. No acute cardiopulmonary disease. 2. Aortic atherosclerosis. 3. Changes of COPD. Electronically Signed   By: San Morelle M.D.   On: 11/01/2016 09:07   Dg Abd 1 View  Result Date: 11/17/2016 CLINICAL DATA:  Constipation. EXAM: ABDOMEN - 1 VIEW COMPARISON:  CT 11/17/2016 FINDINGS: Oral contrast material noted in the right colon and transverse colon. Contrast also noted within the urinary bladder from prior IV contrast administration. No evidence of bowel obstruction or free air. No organomegaly. No acute bony abnormality appear IMPRESSION: No acute findings. Electronically Signed   By: Rolm Baptise M.D.   On: 11/17/2016 07:59   Mr Brain Wo Contrast  Result Date: 11/01/2016 CLINICAL DATA:  Weakness EXAM: MRI HEAD WITHOUT CONTRAST TECHNIQUE: Multiplanar, multiecho pulse sequences of the brain and surrounding structures were obtained without intravenous contrast. COMPARISON:  None. FINDINGS: Brain: Moderate atrophy. Ventricular enlargement consistent with atrophy. Negative for acute infarct. Hyperintensity in the cerebral white matter bilaterally. Hyperintensity in the left thalamus. These areas are most likely due to chronic  ischemia. Negative for hemorrhage or mass. No shift of the midline structures. Vascular: Normal arterial flow voids. Skull and upper cervical spine: Negative Sinuses/Orbits: Paranasal sinuses clear. Bilateral mastoid effusion. Normal orbit. Other: None IMPRESSION: Atrophy and moderate chronic ischemic change.  No acute abnormality Bilateral mastoid sinus effusion. Electronically Signed   By: Franchot Gallo M.D.   On: 11/01/2016 18:48   Ct Abdomen Pelvis W Contrast  Result Date: 11/17/2016 CLINICAL DATA:  83 year old hypertensive female with systemic inflammatory response. History deep venous thrombosis. Recent urinary tract infection. Leukocytosis. Fever. Initial encounter. EXAM: CT ABDOMEN AND PELVIS WITH CONTRAST TECHNIQUE: Multidetector CT imaging of the abdomen and pelvis was performed using the standard protocol following bolus administration of intravenous contrast. CONTRAST:  100 cc Isovue 300. COMPARISON:  No comparison CT. FINDINGS: Lower chest: Bronchiectasis lower right middle lobe. Basilar subsegmental atelectasis. Trace left pleural effusion/ pleural thickening. Heart slightly enlarged.  Mitral valve calcifications. Hepatobiliary: 2.1 cm cyst dome of liver. Posterior dome liver 1.7 cm hemangioma suspected. No calcified gallstone. Pancreas: No pancreatic mass. Spleen: No mass or enlargement. Adrenals/Urinary Tract: Mild hyperplasia adrenal glands. No renal or ureteral obstructing stone or hydronephrosis. Small cysts right lower pole. Noncontrast filled imaging of the urinary bladder without gross abnormality. Stomach/Bowel: Small hiatal hernia. Under distended stomach without gross abnormality. Minimal haziness of fat planes suggestive of third spacing of fluid giving subcutaneous appearance rather than related to primary bowel inflammatory process. No free air or drainable abscess. Moderate stool rectosigmoid region. Vascular/Lymphatic: Atherosclerotic changes aorta with ectasia without aneurysmal  dilation. Atherosclerotic changes iliac arteries with slight ectasia and narrowing. No obvious thrombosis of the iliac veins. No adenopathy. Reproductive: Endometrial lining measures 3 mm. If the patient had any vaginal bleeding than this can be assessed with pelvic sonogram. Tiny left ovarian cysts. Other: Mild third spacing  of fluid. Musculoskeletal: Post left intertrochanteric fixation with streak artifact. Scoliosis lumbar spine convex left with disc space narrowing greatest on the right at the L2-3 level and on the left at the L4-5 level. IMPRESSION: Mild third spacing of fluid without source of patient's fever identified on the current exam. Right middle lobe bronchiectasis. Subsegmental basilar atelectasis with trace left pleural effusion/pleural thickening. Small hemangioma posterior dome of the liver suspected. Aortic atherosclerosis. Endometrial lining thickness 3 mm and tiny left ovarian cyst possibly incidental findings. If there is any vaginal bleeding, pelvic sonogram may then be considered for further delineation. Scoliosis and degenerative changes. Electronically Signed   By: Genia Del M.D.   On: 11/17/2016 06:38   I review her peripheral smear.  Noted leukocytosis with left shift No abnormal blasts seen ASSESSMENT & PLAN  Chronic leukocytosis Reactive thrombocytosis Recent fever and chills Recent colonoscopy with biopsy Recently ulcer History of chronic bronchiectasis  Overall presentation is highly suspicious for infectious etiology as a cause of chronic leukocytosis and thrombocytosis.  She is responding to IV antibiotics with resolution of some of her symptoms. Awaiting outside records to see the trend of her leukocytosis She could have leukocytosis chronically due to bronchiectasis The thrombocytosis appear reactive in nature It is possible that she may have bacterial translocation from recent colonoscopy and biopsy Unfortunately, with repeated courses of antibiotic  therapy, all her cultures so far were negative  We discussed the risk and benefit of bone marrow aspirate and biopsy to exclude myeloproliferative disorder Again, I do not favor this as a potential diagnosis because of the timing of events After much discussion, the patient and family members are in agreement to defer bone marrow aspirate and biopsy for now  I will return tomorrow to check on the patient again and to review outside records I have addressed all questions and concerns    All questions were answered. The patient knows to call the clinic with any problems, questions or concerns. I spent 55 minutes counseling the patient face to face. The total time spent in the appointment was 80 minutes and more than 50% was on counseling.     Heath Lark, MD 11/17/2016 5:23 PM

## 2016-11-17 NOTE — Progress Notes (Signed)
ANTICOAGULATION CONSULT NOTE - Initial Consult  Pharmacy Consult for Coumadin Indication: h/o DVT  Allergies  Allergen Reactions  . Other Other (See Comments)    Patient has an intolerance to Augmentin,Zithromax,Bioxin,Avalox, & Erythromycin. She can not take them unless they are needed for Bronchiectasis  . Codeine Other (See Comments)    Unknown reaction    Patient Measurements: Height: 5' (152.4 cm) Weight: 124 lb (56.2 kg) IBW/kg (Calculated) : 45.5  Vital Signs: Temp: 98.8 F (37.1 C) (03/12 2316) Temp Source: Oral (03/12 2316) BP: 133/70 (03/12 2345) Pulse Rate: 122 (03/12 2345)  Labs:  Recent Labs  11/16/16 1820 11/16/16 2153  HGB 12.5  --   HCT 37.0  --   PLT 515*  --   LABPROT  --  32.9*  INR  --  3.13  CREATININE 0.95  --     Estimated Creatinine Clearance: 35.3 mL/min (by C-G formula based on SCr of 0.95 mg/dL).   Medical History: Past Medical History:  Diagnosis Date  . Aortic regurgitation 12/24/2013   moderate  . Bronchiectasis ([place])   . Bursitis of right shoulder   . DVT (deep venous thrombosis) ([place])   . 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 Mitral regurgitation 12/24/2013   mild    Assessment: 83yo female was recently admitted for myalgia and UTI, now c/o worsening weakness, concern and tx for sepsis, to continue Coumadin for h/o DVT; current INR slightly above goal, last dose of Coumadin taken 3/12 prior to arrival.  Goal of Therapy:  INR 2-3   Plan:  Will hold off on ordering Coumadin until am INR shows whether trending up or down.  Wynona Neat, PharmD, BCPS  11/17/2016,12:04 AM

## 2016-11-17 NOTE — Progress Notes (Signed)
PROGRESS NOTE    Vanessa Page  KXF:[id number] DOB: 01-03-1931 DOA: 11/16/2016 PCP: Pcp Not In System    Brief Narrative:  Vanessa Page is a 83 y.o. female with medical history significant of bronchiectasis factor C def, history of DVT lower extremities,  hyponatremia    Presented with diffuse weakness fevers after 101.3 patient was noted to tachycardia Dr. 110 she endorses lower extremity edema bilaterally on room air oxygen saturation was 90% on the mass arrival and she was started on 3 L. Since her admission she has been gradually getting weaker. Despite home PT never got better. States the weakness is worse in lower extremities. But arms have improved. She has had bilateral leg edema. Denies any sick contacts. NO diarrhea. She has been more sleepy recently.  States the decreased exercise tolerance started 6 weeks ago after she had Colonoscopy (showed only polyp) since then have had dyspnea with activity and progressive weakness.  Patient was recently admitted for UTI associated diffuse weakness she was prior to this on Bactrim and thought this was secondary to antibiotics given myalgias felt to be serum sickness-like presentation neurology was consulted and felt that she has no ongoing GBS, stating he likes symptoms she was discharged home with PT OT home health. TI was treated with IV ceftriaxone she was discharged on oral Keflex Patient was discharged home 2/27 of February  Of note patient has known history of venous stasis ulcer on the right lower extremity that been healing well. Otherwise she hasn't had any nausea or vomiting no diarrhea no abdominal pain no cough no shortness of breath also throat no chest pain  Assessment & Plan:   Principal Problem:   SIRS (systemic inflammatory response syndrome) (HCC) Active Problems:   Leukocytosis   Chronic anticoagulation   History of DVT of lower extremity   Bronchiectasis (HCC)   Essential hypertension   Hyponatremia    Murmur  #1 systemic inflammatory response syndrome Questionable etiology. Patient presented with fever, tachycardia, leukocytosis, elevated lactic acid level that has trended down. Questionable etiology. CT abdomen and pelvis negative for any source of infection. Abdominal x-ray unremarkable. Patient has been pancultured. Continue empiric IV vancomycin and IV Zosyn. Follow.  #2 hypertension Stable.  #3 leukocytosis Patient with a persistent leukocytosis unclear as to whether this is chronic. Patient recently hospitalized with improvement with leukocytosis With white count trending down to 19.1 by day of discharge while on antibiotics. Patient presenting back with a worsening leukocytosis with white count of 29.5. Patient has been pancultured. Patient is not toxic appearing. Patient noted on CBC to have atypical lymphocytes. Patient also presented with myalgias and generalized weakness. Patient with elevated sedimentation rate of 70. Patient noted to have albumin level of 1.6. Will consult with hematology for further evaluation and management. Continue empiric IV vancomycin and IV Zosyn. Follow.  #4 hyponatremia Questionable etiology. Check a urine sodium, urine creatinine, TSH, cortisol level. Patient also noted to have lower extremity edema. Follow for now.  #5 history of Protein C deficiency/lower extremity DVT Patient on chronic anticoagulation. INR supratherapeutic. Coumadin per pharmacy.  #6 lower extremity edema Lower extremity Dopplers negative for DVT. 2-D echo with EF of 65-70%, no wall motion abnormalities, grade 2 diastolic dysfunction. Patient also noted to have hypoalbuminemia. Patient with a normal renal function. LFTs essentially within normal limits. CT abdomen and pelvis negative for any acute abnormalities.   DVT prophylaxis: Supratherapeutic INR. On Coumadin. Code Status: DO NOT RESUSCITATE Family Communication: Updated patient and family at bedside.  Disposition Plan: Home  once medically stable workup completed, afebrile with resolution of leukocytosis.   Consultants:   Hematology/oncology pending  Procedures:   CT abdomen and pelvis 11/17/2016  Abdominal x-ray 11/17/2016  Chest x-ray 11/16/2016  2-D echo 11/17/2016  LE Dopplers 11/17/2016  Antimicrobials:   IV Zosyn 11/17/2016  Vancomycin 11/17/2016   Subjective: Patient denies chest pain. No shortness of breath. Patient alert to self place and time. No confusion. Patient perplexed about the etiology of her myalgias and generalized weakness.  Objective: Vitals:   11/16/16 2345 11/17/16 0000 11/17/16 0031 11/17/16 0332  BP: 133/70 (!) 123/54 123/60 (!) 113/53  Pulse: (!) 122 120 (!) 101 (!) 110  Resp: (!) 27 24 20 20   Temp:   98.3 F (36.8 C) 98.1 F (36.7 C)  TempSrc:   Oral Oral  SpO2: 96% 96% 98% 94%  Weight:   57.2 kg (126 lb 3.2 oz)   Height:   5' (1.524 m)     Intake/Output Summary (Last 24 hours) at 11/17/16 1706 Last data filed at 11/17/16 0333  Gross per 24 hour  Intake           1382.5 ml  Output              475 ml  Net            907.5 ml   Filed Weights   11/16/16 1817 11/17/16 0031  Weight: 56.2 kg (124 lb) 57.2 kg (126 lb 3.2 oz)    Examination:  General exam: Appears calm and comfortable  Respiratory system: Clear to auscultation. Respiratory effort normal. Cardiovascular system: S1 & S2 heard, RRR. No JVD, murmurs, rubs, gallops or clicks. 1+ BLE edema Gastrointestinal system: Abdomen is nondistended, soft and nontender. No organomegaly or masses felt. Normal bowel sounds heard. Central nervous system: Alert and oriented. No focal neurological deficits. Extremities: Symmetric 5 x 5 power. Skin: No rashes, lesions or ulcers Psychiatry: Judgement and insight appear normal. Mood & affect appropriate.     Data Reviewed: I have personally reviewed following labs and imaging studies  CBC:  Recent Labs Lab 11/16/16 1820 11/17/16 0449  WBC 29.5*  26.9*  NEUTROABS 25.0* 22.3*  HGB 12.5 10.2*  HCT 37.0 30.6*  MCV 82.0 81.6  PLT 515* 993*   Basic Metabolic Panel:  Recent Labs Lab 11/16/16 1820 11/17/16 0450  NA 132* 132*  K 4.1 3.2*  CL 93* 99*  CO2 26 25  GLUCOSE 133* 117*  BUN 18 12  CREATININE 0.95 0.80  CALCIUM 8.3* 7.1*  MG  --  1.7  PHOS  --  3.0   GFR: Estimated Creatinine Clearance: 42.2 mL/min (by C-G formula based on SCr of 0.8 mg/dL). Liver Function Tests:  Recent Labs Lab 11/16/16 1820 11/17/16 0450  AST 36 44*  ALT 26 27  ALKPHOS 120 118  BILITOT 0.6 0.8  PROT 6.1* 4.9*  ALBUMIN 2.2* 1.6*   No results for input(s): LIPASE, AMYLASE in the last 168 hours. No results for input(s): AMMONIA in the last 168 hours. Coagulation Profile:  Recent Labs Lab 11/16/16 2153 11/17/16 0450  INR 3.13 3.36   Cardiac Enzymes:  Recent Labs Lab 11/16/16 2341 11/17/16 0450 11/17/16 1133  CKTOTAL 20*  --   --   TROPONINI 0.06* 0.03* 0.04*   BNP (last 3 results) No results for input(s): PROBNP in the last 8760 hours. HbA1C: No results for input(s): HGBA1C in the last 72 hours. CBG: No results for input(s): GLUCAP  in the last 168 hours. Lipid Profile: No results for input(s): CHOL, HDL, LDLCALC, TRIG, CHOLHDL, LDLDIRECT in the last 72 hours. Thyroid Function Tests:  Recent Labs  11/17/16 0449  TSH 2.072   Anemia Panel: No results for input(s): VITAMINB12, FOLATE, FERRITIN, TIBC, IRON, RETICCTPCT in the last 72 hours. Sepsis Labs:  Recent Labs Lab 11/16/16 1834 11/16/16 2200 11/16/16 2226  PROCALCITON  --   --  0.45  LATICACIDVEN 2.48* 1.28  --     Recent Results (from the past 240 hour(s))  Blood Culture (routine x 2)     Status: None (Preliminary result)   Collection Time: 11/16/16  6:25 PM  Result Value Ref Range Status   Specimen Description BLOOD RIGHT HAND  Final   Special Requests BOTTLES DRAWN AEROBIC ONLY 5CC  Final   Culture NO GROWTH < 24 HOURS  Final   Report Status  PENDING  Incomplete  Blood Culture (routine x 2)     Status: None (Preliminary result)   Collection Time: 11/16/16  6:55 PM  Result Value Ref Range Status   Specimen Description BLOOD LEFT HAND  Final   Special Requests BOTTLES DRAWN AEROBIC ONLY 10CC  Final   Culture NO GROWTH < 24 HOURS  Final   Report Status PENDING  Incomplete  Gram stain     Status: None   Collection Time: 11/16/16  7:39 PM  Result Value Ref Range Status   Specimen Description URINE, CATHETERIZED  Final   Special Requests NONE  Final   Gram Stain   Final    SQUAMOUS EPITHELIAL CELLS PRESENT WBC PRESENT, PREDOMINANTLY PMN GRAM POSITIVE RODS GRAM POSITIVE COCCI IN PAIRS CYTOSPIN SMEAR    Report Status 11/16/2016 FINAL  Final  Urine culture     Status: None   Collection Time: 11/16/16  7:39 PM  Result Value Ref Range Status   Specimen Description URINE, CATHETERIZED  Final   Special Requests NONE  Final   Culture NO GROWTH  Final   Report Status 11/17/2016 FINAL  Final  Respiratory Panel by PCR     Status: None   Collection Time: 11/16/16  9:55 PM  Result Value Ref Range Status   Adenovirus NOT DETECTED NOT DETECTED Final   Coronavirus 229E NOT DETECTED NOT DETECTED Final   Coronavirus HKU1 NOT DETECTED NOT DETECTED Final   Coronavirus NL63 NOT DETECTED NOT DETECTED Final   Coronavirus OC43 NOT DETECTED NOT DETECTED Final   Metapneumovirus NOT DETECTED NOT DETECTED Final   Rhinovirus / Enterovirus NOT DETECTED NOT DETECTED Final   Influenza A NOT DETECTED NOT DETECTED Final   Influenza B NOT DETECTED NOT DETECTED Final   Parainfluenza Virus 1 NOT DETECTED NOT DETECTED Final   Parainfluenza Virus 2 NOT DETECTED NOT DETECTED Final   Parainfluenza Virus 3 NOT DETECTED NOT DETECTED Final   Parainfluenza Virus 4 NOT DETECTED NOT DETECTED Final   Respiratory Syncytial Virus NOT DETECTED NOT DETECTED Final   Bordetella pertussis NOT DETECTED NOT DETECTED Final   Chlamydophila pneumoniae NOT DETECTED NOT  DETECTED Final   Mycoplasma pneumoniae NOT DETECTED NOT DETECTED Final         Radiology Studies: Dg Chest 2 View  Result Date: 11/16/2016 CLINICAL DATA:  83 year old female with history of shortness of breath for the past 2 weeks, worsening on exertion. History bronchiectasis. EXAM: CHEST  2 VIEW COMPARISON:  Chest x-ray 11/01/2016. FINDINGS: No consolidative airspace disease. Small left pleural effusion blunting the left costophrenic sulcus. No right pleural effusion.  No evidence of pulmonary edema. No definite suspicious appearing pulmonary nodules or masses. Heart size is normal. The patient is rotated to the left on today's exam, resulting in distortion of the mediastinal contours and reduced diagnostic sensitivity and specificity for mediastinal pathology. Atherosclerosis in the thoracic aorta. Bilateral apical pleuroparenchymal thickening, partially calcified, most compatible with chronic post infectious or inflammatory scarring. IMPRESSION: 1. Small left pleural effusion. 2. Aortic atherosclerosis. Electronically Signed   By: Vinnie Langton M.D.   On: 11/16/2016 20:21   Dg Abd 1 View  Result Date: 11/17/2016 CLINICAL DATA:  Constipation. EXAM: ABDOMEN - 1 VIEW COMPARISON:  CT 11/17/2016 FINDINGS: Oral contrast material noted in the right colon and transverse colon. Contrast also noted within the urinary bladder from prior IV contrast administration. No evidence of bowel obstruction or free air. No organomegaly. No acute bony abnormality appear IMPRESSION: No acute findings. Electronically Signed   By: Rolm Baptise M.D.   On: 11/17/2016 07:59   Ct Abdomen Pelvis W Contrast  Result Date: 11/17/2016 CLINICAL DATA:  83 year old hypertensive female with systemic inflammatory response. History deep venous thrombosis. Recent urinary tract infection. Leukocytosis. Fever. Initial encounter. EXAM: CT ABDOMEN AND PELVIS WITH CONTRAST TECHNIQUE: Multidetector CT imaging of the abdomen and pelvis was  performed using the standard protocol following bolus administration of intravenous contrast. CONTRAST:  100 cc Isovue 300. COMPARISON:  No comparison CT. FINDINGS: Lower chest: Bronchiectasis lower right middle lobe. Basilar subsegmental atelectasis. Trace left pleural effusion/ pleural thickening. Heart slightly enlarged.  Mitral valve calcifications. Hepatobiliary: 2.1 cm cyst dome of liver. Posterior dome liver 1.7 cm hemangioma suspected. No calcified gallstone. Pancreas: No pancreatic mass. Spleen: No mass or enlargement. Adrenals/Urinary Tract: Mild hyperplasia adrenal glands. No renal or ureteral obstructing stone or hydronephrosis. Small cysts right lower pole. Noncontrast filled imaging of the urinary bladder without gross abnormality. Stomach/Bowel: Small hiatal hernia. Under distended stomach without gross abnormality. Minimal haziness of fat planes suggestive of third spacing of fluid giving subcutaneous appearance rather than related to primary bowel inflammatory process. No free air or drainable abscess. Moderate stool rectosigmoid region. Vascular/Lymphatic: Atherosclerotic changes aorta with ectasia without aneurysmal dilation. Atherosclerotic changes iliac arteries with slight ectasia and narrowing. No obvious thrombosis of the iliac veins. No adenopathy. Reproductive: Endometrial lining measures 3 mm. If the patient had any vaginal bleeding than this can be assessed with pelvic sonogram. Tiny left ovarian cysts. Other: Mild third spacing of fluid. Musculoskeletal: Post left intertrochanteric fixation with streak artifact. Scoliosis lumbar spine convex left with disc space narrowing greatest on the right at the L2-3 level and on the left at the L4-5 level. IMPRESSION: Mild third spacing of fluid without source of patient's fever identified on the current exam. Right middle lobe bronchiectasis. Subsegmental basilar atelectasis with trace left pleural effusion/pleural thickening. Small hemangioma  posterior dome of the liver suspected. Aortic atherosclerosis. Endometrial lining thickness 3 mm and tiny left ovarian cyst possibly incidental findings. If there is any vaginal bleeding, pelvic sonogram may then be considered for further delineation. Scoliosis and degenerative changes. Electronically Signed   By: Genia Del M.D.   On: 11/17/2016 06:38        Scheduled Meds: . calcium carbonate  600 mg of elemental calcium Oral Q breakfast  . feeding supplement (ENSURE ENLIVE)  237 mL Oral BID BM  . fluticasone  2 spray Each Nare Daily  . piperacillin-tazobactam (ZOSYN)  IV  3.375 g Intravenous Q8H  . senna  1 tablet Oral BID  .  sodium chloride flush  3 mL Intravenous Q12H  . vancomycin  750 mg Intravenous Q24H  . Warfarin - Pharmacist Dosing Inpatient   Does not apply q1800   Continuous Infusions:   LOS: 1 day    Time spent: 78 minutes    Kacin Dancy, MD [facility] Pager [phone]  If 7PM-7AM, please contact night-coverage [email or web address] Password [facility] 11/17/2016, 5:06 PM

## 2016-11-17 NOTE — Care Management Note (Signed)
Case Management Note Marvetta Gibbons RN, BSN Unit 2W-Case Manager [phone]  Patient Details  Name: Vanessa Page MRN: [id number] Date of Birth: 14-Jan-1931  Subjective/Objective:   Pt admitted with fever/SIRS                 Action/Plan:  PTA Pt lived at home with family/spouse- active with [facility] services-from [facility]- for HHRN/PT/OT/aide- will need resumption orders for discharge. CM to follow.   Expected Discharge Date:                  Expected Discharge Plan:  [place]  In-House Referral:     Discharge planning Services  CM Consult  Post Acute Care Choice:  Home Health, Resumption of Svcs/PTA Provider Choice offered to:  Patient, Spouse  DME Arranged:    DME Agency:     HH Arranged:  RN, PT, OT, Nurse's Aide [place] Agency:  [place]  Status of Service:  In process, will continue to follow  If discussed at Long Length of Stay Meetings, dates discussed:    Additional Comments:  Dawayne Patricia, RN 11/17/2016, 12:24 PM

## 2016-11-17 NOTE — Progress Notes (Signed)
CRITICAL VALUE ALERT  Critical value received:  Troponin 0.06  Date of notification:  3/133  Time of notification:  1:20am  Critical value read back:Yes.    Nurse who received alert:  Doree Albee, RN  MD notified (1st page):  [facility]  Time of first page:  1:22am   No further orders at this time

## 2016-11-17 NOTE — Evaluation (Signed)
Physical Therapy Evaluation Patient Details Name: Vanessa Page MRN: [id number] DOB: [birth year] Today's Date: 11/17/2016   History of Present Illness  Vanessa Page is a 83 y.o. female with medical history significant of bronchiectasis factor C def, history of DVT lower extremities,  hyponatremia. Pt admitted with fever and tachycardia  Clinical Impression  Pt with generalized deconditioning from unknown illness. Pt currently requiring minA for mobility. Pt from [place] but here visiting her son. Pt unsafe to travel home to [place] at this time, pt reports she can stay at her sons and have 24/7 assist with [facility] [place] [facility].    Follow Up Recommendations Home health PT;Supervision/Assistance - 24 hour    Equipment Recommendations  Rolling walker with 5" wheels    Recommendations for Other Services       Precautions / Restrictions Precautions Precautions: Fall Restrictions Weight Bearing Restrictions: No      Mobility  Bed Mobility Overal bed mobility: Needs Assistance Bed Mobility: Rolling;Sidelying to Sit Rolling: Min assist Sidelying to sit: Mod assist       General bed mobility comments: pt required increased assist to achieve upright due to pain, limited ability to push  Transfers Overall transfer level: Needs assistance Equipment used: Rolling walker (2 wheeled) Transfers: Sit to/from Stand Sit to Stand: Min assist         General transfer comment: cues for hand placement  Ambulation/Gait Ambulation/Gait assistance: Min assist Ambulation Distance (Feet): 15 Feet Assistive device: Rolling walker (2 wheeled) Gait Pattern/deviations: Step-through pattern Gait velocity: slow and guarded Gait velocity interpretation: <1.8 ft/sec, indicative of risk for recurrent falls General Gait Details: very slow and guarded, pt reports " i can't walk farther because my bone structure won't held"  Stairs            Wheelchair  Mobility    Modified Rankin (Stroke Patients Only)       Balance Overall balance assessment: Needs assistance;History of Falls Sitting-balance support: No upper extremity supported;Feet supported Sitting balance-Leahy Scale: Fair     Standing balance support: Bilateral upper extremity supported;During functional activity Standing balance-Leahy Scale: Fair Standing balance comment: needs RW                             Pertinent Vitals/Pain Pain Assessment: 0-10 Pain Score: 9  Pain Location: chest/shoulders/upper back very painful with movement, 2/10 when at rest Pain Descriptors / Indicators: Tightness ("the structure is gone") Pain Intervention(s): Monitored during session    [place] expects to be discharged to:: Private residence Living Arrangements: Spouse/significant other Available Help at Discharge: Available 24 hours/day Type of Home: Independent living facility Home Access: Level entry     Home Layout: One level Home Equipment: Cane - single point;Bedside commode;Grab bars - toilet;Hand held shower head Additional Comments: visiting son in [place], live in [place]    Prior Function Level of Independence: Needs assistance   Gait / Transfers Assistance Needed: used a RW since last admission  ADL's / Homemaking Assistance Needed: spouse and home health agency assisted pt with bathing and dressing        Hand Dominance   Dominant Hand: Right    Extremity/Trunk Assessment   Upper Extremity Assessment Upper Extremity Assessment: Generalized weakness    Lower Extremity Assessment Lower Extremity Assessment: Generalized weakness    Cervical / Trunk Assessment Cervical / Trunk Assessment: Kyphotic  Communication   Communication: No difficulties  Cognition Arousal/Alertness: Awake/alert Behavior  During Therapy: WFL for tasks assessed/performed Overall Cognitive Status: Within Functional Limits for tasks assessed                       General Comments      Exercises General Exercises - Lower Extremity Ankle Circles/Pumps: AROM;Both;10 reps;Seated Gluteal Sets: AROM;10 reps;Supine Long Arc Quad: AROM;10 reps;Seated   Assessment/Plan    PT Assessment Patient needs continued PT services  PT Problem List         PT Treatment Interventions DME instruction;Gait training;Functional mobility training;Therapeutic activities;Therapeutic exercise;Balance training;Patient/family education    PT Goals (Current goals can be found in the Care Plan section)  Acute Rehab PT Goals Patient Stated Goal: to go home PT Goal Formulation: With patient Time For Goal Achievement: 12/01/16 Potential to Achieve Goals: Good    Frequency Min 3X/week   Barriers to discharge        Co-evaluation               End of Session Equipment Utilized During Treatment: Gait belt Activity Tolerance: Patient limited by fatigue Patient left: in chair;with call bell/phone within reach;with family/visitor present Nurse Communication: Mobility status PT Visit Diagnosis: Unsteadiness on feet (R26.81);History of falling (Z91.81);Muscle weakness (generalized) (M62.81);Pain Pain - Right/Left: Left Pain - part of body:  (bilat shoulders)         Time: 5361-4431 PT Time Calculation (min) (ACUTE ONLY): 28 min   Charges:   PT Evaluation $PT Eval Moderate Complexity: 1 Procedure PT Treatments $Gait Training: 8-22 mins   PT G Codes:         Theodus Ran M Deven Furia 11/17/2016, 12:18 PM   Kittie Plater, PT, DPT Pager #: [phone] Office #: [phone]

## 2016-11-17 NOTE — Progress Notes (Signed)
[place] for Coumadin Indication: h/o DVT  Allergies  Allergen Reactions  . Other Other (See Comments)    Patient has an intolerance to Augmentin,Zithromax,Bioxin,Avalox, & Erythromycin. She can not take them unless they are needed for Bronchiectasis  . Codeine Other (See Comments)    Unknown reaction    Patient Measurements: Height: 5' (152.4 cm) Weight: 126 lb 3.2 oz (57.2 kg) IBW/kg (Calculated) : 45.5  Vital Signs: Temp: 98.1 F (36.7 C) (03/13 0332) Temp Source: Oral (03/13 0332) BP: 113/53 (03/13 0332) Pulse Rate: 110 (03/13 0332)  Labs:  Recent Labs  11/16/16 1820 11/16/16 2153 11/16/16 2341 11/17/16 0449 11/17/16 0450  HGB 12.5  --   --  10.2*  --   HCT 37.0  --   --  30.6*  --   PLT 515*  --   --  474*  --   LABPROT  --  32.9*  --   --  34.8*  INR  --  3.13  --   --  3.36  CREATININE 0.95  --   --   --  0.80  CKTOTAL  --   --  20*  --   --   TROPONINI  --   --  0.06*  --  0.03*    Estimated Creatinine Clearance: 42.2 mL/min (by C-G formula based on SCr of 0.8 mg/dL).   Medical History: Past Medical History:  Diagnosis Date  . Aortic regurgitation 12/24/2013   moderate  . Bronchiectasis ([place])   . Bursitis of right shoulder   . DVT (deep venous thrombosis) ([place])   . Factor deficiency, coagulation ([place])    Patient reports Protein C deficiency  . Mitral regurgitation 12/24/2013   mild    Assessment: 83yo female was recently admitted for myalgia and UTI, now c/o worsening weakness, concern and tx for sepsis, to continue Coumadin for h/o DVT. INR slightly above goal at 3.13 on admit, now trending up to 3.36, last dose of Coumadin taken 3/12 prior to arrival. PMH of Factor C deficiency followed by Heme. Hg down 10.2, plt stable 474, no bleed documented.  Home warfarin dose: 2mg  on MWF, 1mg  AOD  Goal of Therapy:  INR 2-3  Monitor platelets by anticoagulation protocol:  yes   Plan:  Hold warfarin tonight with  INR trending up Daily INR Monitor CBC, s/sx bleeding   Elicia Lamp, PharmD, BCPS Clinical Pharmacist 11/17/2016 10:13 AM

## 2016-11-17 NOTE — Progress Notes (Signed)
**  Preliminary report by tech**  Bilateral lower extremity venous duplex completed. There is no evidence of deep or superficial vein thrombosis involving the right and left lower extremities. All visualized vessels appear patent and compressible. There is no evidence of Baker's cysts bilaterally.  11/17/16 3:04 PM Vanessa Page RVT

## 2016-11-17 NOTE — Progress Notes (Signed)
Initial Nutrition Assessment  DOCUMENTATION CODES:   Not applicable  INTERVENTION:  Ensure Enlive BID between meals. Each supplement provides 350 kcal and 20 grams of protein.   Provided patient with coupons to purchase Ensure Enlive once discharged.  NUTRITION DIAGNOSIS:   Inadequate oral intake related to acute illness as evidenced by per patient/family report.  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   Patient reported appetite had been decreased for one week in February (after beginning antibiotics on February 14th for her leg ulcer). The poor po intake lasted for one week, but has since subsided. Patient reports she eats 3 meals per day and lives at home with her husband and has home health services from [facility]. Patient reports she still cooks meals on her own.  During consult, patient was sitting up in chair eating lunch (french fries and fried fish). Patient reported her appetite has been good since admission.  Patient reported she had been drinking Ensure Enlive since last admission (2/25). Patient agreed to continue drinking this supplement during current admission to provide extra protein and calories to her current intake.   Unable to perform nutrition focused physical exam due to patient eating lunch at time of consult. Told patient I would return after lunch. Once I returned, patient was being discharged so nutrition focused physical exam was not appropriate. Provided patient with Ensure Enlive coupons.  Meds Reviewed: Warfarin, [place]  Labs Reviewed: Sodium 132 (L), Potassium 3.2 (l),  Chloride 99 ( L), Glucose 117 (H), Calcium 7.1 (L), Albumin 1.6 (L)  Diet Order:  Diet regular Room service appropriate? Yes; Fluid consistency: Thin  Skin:  Reviewed, no issues  Last BM:  3/10  Height:   Ht Readings from Last 1 Encounters:  11/17/16 5' (1.524 m)    Weight:   Wt Readings from Last 1 Encounters:  11/17/16 126 lb 3.2 oz (57.2 kg)    Ideal Body Weight:  45.45 kg  BMI:  Body mass index is 24.65 kg/m.  Estimated Nutritional Needs:   Kcal:  1400-1600 kcal  Protein:  58-68 grams  Fluid:  >/= 1.5 L/day  EDUCATION NEEDS:   No education needs identified at this time  Juliann Pulse M.S. Nutrition Dietetic Intern

## 2016-11-17 NOTE — Progress Notes (Signed)
Reviewed discharge instructions with patient and family. No issues at present. Iv removed. Awaiting wheelchair.   Donavon Kimrey, Mervin Kung RN

## 2016-11-18 ENCOUNTER — Encounter (HOSPITAL_COMMUNITY): Payer: Self-pay | Admitting: *Deleted

## 2016-11-18 DIAGNOSIS — R6510 Systemic inflammatory response syndrome (SIRS) of non-infectious origin without acute organ dysfunction: Secondary | ICD-10-CM

## 2016-11-18 LAB — COMPREHENSIVE METABOLIC PANEL
ALBUMIN: 1.6 g/dL — AB (ref 3.5–5.0)
ALT: 32 U/L (ref 14–54)
AST: 48 U/L — AB (ref 15–41)
Alkaline Phosphatase: 120 U/L (ref 38–126)
Anion gap: 9 (ref 5–15)
BUN: 11 mg/dL (ref 6–20)
CHLORIDE: 98 mmol/L — AB (ref 101–111)
CO2: 26 mmol/L (ref 22–32)
Calcium: 7.5 mg/dL — ABNORMAL LOW (ref 8.9–10.3)
Creatinine, Ser: 0.86 mg/dL (ref 0.44–1.00)
GFR calc Af Amer: >60 mL/min (ref >60)
GFR calc non Af Amer: >60 mL/min (ref >60)
GLUCOSE: 118 mg/dL — AB (ref 65–99)
Potassium: 3.4 mmol/L — ABNORMAL LOW (ref 3.5–5.1)
Sodium: 133 mmol/L — ABNORMAL LOW (ref 135–145)
Total Bilirubin: 0.8 mg/dL (ref 0.3–1.2)
Total Protein: 5 g/dL — ABNORMAL LOW (ref 6.5–8.1)

## 2016-11-18 LAB — CBC WITH DIFFERENTIAL/PLATELET
BASOS ABS: 0 10*3/uL (ref 0.0–0.1)
Basophils Relative: 0 %
Eosinophils Absolute: 0.9 10*3/uL — ABNORMAL HIGH (ref 0.0–0.7)
Eosinophils Relative: 4 %
HCT: 31.5 % — ABNORMAL LOW (ref 36.0–46.0)
HEMOGLOBIN: 10.3 g/dL — AB (ref 12.0–15.0)
LYMPHS ABS: 2 10*3/uL (ref 0.7–4.0)
LYMPHS PCT: 8 %
MCH: 26.8 pg (ref 26.0–34.0)
MCHC: 32.7 g/dL (ref 30.0–36.0)
MCV: 82 fL (ref 78.0–100.0)
Monocytes Absolute: 1.9 10*3/uL — ABNORMAL HIGH (ref 0.1–1.0)
Monocytes Relative: 8 %
NEUTROS PCT: 80 %
Neutro Abs: 19.8 10*3/uL — ABNORMAL HIGH (ref 1.7–7.7)
Platelets: 490 10*3/uL — ABNORMAL HIGH (ref 150–400)
RBC: 3.84 MIL/uL — AB (ref 3.87–5.11)
RDW: 14.8 % (ref 11.5–15.5)
WBC: 24.6 10*3/uL — AB (ref 4.0–10.5)

## 2016-11-18 LAB — SAVE SMEAR

## 2016-11-18 LAB — PROCALCITONIN: Procalcitonin: 0.38 ng/mL

## 2016-11-18 LAB — ALDOLASE: ALDOLASE: 11 U/L — AB (ref 3.3–10.3)

## 2016-11-18 LAB — PROTIME-INR
INR: 3.01
Prothrombin Time: 31.9 seconds — ABNORMAL HIGH (ref 11.4–15.2)

## 2016-11-18 LAB — PATHOLOGIST SMEAR REVIEW

## 2016-11-18 LAB — ANTINUCLEAR ANTIBODIES, IFA: ANTINUCLEAR ANTIBODIES, IFA: NEGATIVE

## 2016-11-18 MED ORDER — FOLIC ACID 1 MG PO TABS
1.0000 mg | ORAL_TABLET | Freq: Every day | ORAL | Status: DC
  Administered 2016-11-18 – 2016-11-21 (×4): 1 mg via ORAL
  Filled 2016-11-18 (×4): qty 1

## 2016-11-18 MED ORDER — ACETAMINOPHEN 650 MG RE SUPP: 650.0000 mg | Freq: Four times a day (QID) | RECTAL | Status: DC | PRN

## 2016-11-18 MED ORDER — CALCIUM CARBONATE 1250 (500 CA) MG PO TABS
1.0000 | ORAL_TABLET | Freq: Every day | ORAL | Status: DC
  Administered 2016-11-19 – 2016-11-21 (×3): 500 mg via ORAL
  Filled 2016-11-18 (×3): qty 1

## 2016-11-18 MED ORDER — THIAMINE HCL 100 MG/ML IJ SOLN
100.0000 mg | Freq: Every day | INTRAMUSCULAR | Status: DC
  Administered 2016-11-18: 100 mg via INTRAVENOUS
  Filled 2016-11-18: qty 2

## 2016-11-18 MED ORDER — ACETAMINOPHEN 325 MG PO TABS
325.0000 mg | ORAL_TABLET | Freq: Four times a day (QID) | ORAL | Status: DC | PRN
  Administered 2016-11-19 – 2016-11-20 (×2): 325 mg via ORAL
  Filled 2016-11-18 (×2): qty 1

## 2016-11-18 MED ORDER — MAGIC MOUTHWASH W/LIDOCAINE
10.0000 mL | Freq: Three times a day (TID) | ORAL | Status: DC
  Administered 2016-11-18 – 2016-11-20 (×6): 10 mL via ORAL
  Filled 2016-11-18 (×7): qty 10

## 2016-11-18 MED ORDER — ADULT MULTIVITAMIN LIQUID CH
15.0000 mL | Freq: Every day | ORAL | Status: DC
  Administered 2016-11-18: 15 mL via ORAL
  Filled 2016-11-18 (×2): qty 15

## 2016-11-18 NOTE — Progress Notes (Signed)
Physical Therapy Treatment Patient Details Name: Vanessa Page MRN: [id number] DOB: [birth year] Today's Date: 11/18/2016    History of Present Illness Vanessa Page is a 83 y.o. female with medical history significant of bronchiectasis factor C def, history of DVT lower extremities,  hyponatremia. Pt admitted with fever and tachycardia    PT Comments    Pt progressing well toward goals with improvement in distance of ambulation. Recommend d/c to SNF for return to mod-I level of function and safe transition home to [place]. PT will continue to follow.    Follow Up Recommendations  SNF;Supervision/Assistance - 24 hour     Equipment Recommendations  None recommended by PT    Recommendations for Other Services       Precautions / Restrictions Precautions Precautions: Fall Restrictions Weight Bearing Restrictions: No    Mobility  Bed Mobility               General bed mobility comments: up in chair upon arrival  Transfers Overall transfer level: Needs assistance Equipment used: Rolling walker (2 wheeled) Transfers: Sit to/from Stand Sit to Stand: Min assist         General transfer comment: stand x 4 with verbal cues for technique, safety with RW and upright posture. first stand L knee buckled and pt required max physical assist to prevent LOB, pt requested to sit in chair and try again. second stand pt required minA to power up. Pt transfered to commode in bathroom and required minA to power up from commode. Pt stood in hall after seated rest break again requiring minA to power up from chair  Ambulation/Gait Ambulation/Gait assistance: Min assist Ambulation Distance (Feet): 80 Feet (40 ft x 2) Assistive device: Rolling walker (2 wheeled) Gait Pattern/deviations: Step-through pattern;Decreased stride length;Trunk flexed;Wide base of support Gait velocity: decreased Gait velocity interpretation: Below normal speed for age/gender General Gait Details: slow speed  with verbal cues for upright posture and safety with RW. required 1 seated break   Stairs            Wheelchair Mobility    Modified Rankin (Stroke Patients Only)       Balance Overall balance assessment: Needs assistance;History of Falls Sitting-balance support: Feet supported;No upper extremity supported Sitting balance-Leahy Scale: Fair Sitting balance - Comments: sat edge of chair x 1 min without LOB   Standing balance support: Bilateral upper extremity supported Standing balance-Leahy Scale: Poor Standing balance comment: reliant on RW/physical assist                    Cognition Arousal/Alertness: Awake/alert Behavior During Therapy: WFL for tasks assessed/performed Overall Cognitive Status: Within Functional Limits for tasks assessed                      Exercises      General Comments General comments (skin integrity, edema, etc.): pt transferred to commode in bathroom and was able to perform hygeine independently in seated, however, required minA to power up from commode      Pertinent Vitals/Pain Pain Assessment: 0-10 Pain Score: 6  Pain Location: thoracic/LE painful with initial movement Pain Descriptors / Indicators: Sore Pain Intervention(s): Limited activity within patient's tolerance;Monitored during session    Home Living                      Prior Function            PT Goals (current goals can now be found in the  care plan section) Acute Rehab PT Goals Patient Stated Goal: go home to [place] Progress towards PT goals: Progressing toward goals    Frequency    Min 3X/week      PT Plan Discharge plan needs to be updated    Co-evaluation             End of Session Equipment Utilized During Treatment: Gait belt Activity Tolerance: Patient tolerated treatment well Patient left: in chair;with call bell/phone within reach;with family/visitor present Nurse Communication: Mobility status PT Visit Diagnosis:  Unsteadiness on feet (R26.81);Other abnormalities of gait and mobility (R26.89);Muscle weakness (generalized) (M62.81);History of falling (Z91.81);Difficulty in walking, not elsewhere classified (R26.2);Pain Pain - part of body: Leg (B LE, thorax)     Time: 7096-4383 PT Time Calculation (min) (ACUTE ONLY): 26 min  Charges:  $Gait Training: 8-22 mins $Therapeutic Activity: 8-22 mins                    G Codes:       Tracie Harrier December 06, 2016, 4:18 PM   Tracie Harrier, SPT Acute Rehab SPT [phone]

## 2016-11-18 NOTE — Progress Notes (Signed)
ANTICOAGULATION CONSULT NOTE - Follow Up Consult  Pharmacy Consult for Coumadin Indication: hx DVT, Protein C deficiency  Allergies  Allergen Reactions  . Other Other (See Comments)    Patient has an intolerance to Augmentin,Zithromax,Bioxin,Avalox, & Erythromycin. She can not take them unless they are needed for Bronchiectasis  . Codeine Other (See Comments)    Unknown reaction    Patient Measurements: Height: 5' (152.4 cm) Weight: 126 lb 3.2 oz (57.2 kg) IBW/kg (Calculated) : 45.5  Vital Signs: Temp: 97.4 F (36.3 C) (03/14 1418) Temp Source: Oral (03/14 1418) BP: 115/53 (03/14 1418)  Labs:  Recent Labs  11/16/16 1820 11/16/16 2153 11/16/16 2341 11/17/16 0449 11/17/16 0450 11/17/16 1133 11/18/16 0425  HGB 12.5  --   --  10.2*  --   --  10.3*  HCT 37.0  --   --  30.6*  --   --  31.5*  PLT 515*  --   --  474*  --   --  490*  LABPROT  --  32.9*  --   --  34.8*  --  31.9*  INR  --  3.13  --   --  3.36  --  3.01  CREATININE 0.95  --   --   --  0.80  --  0.86  CKTOTAL  --   --  20*  --   --   --   --   TROPONINI  --   --  0.06*  --  0.03* 0.04*  --     Estimated Creatinine Clearance: 39.3 mL/min (by C-G formula based on SCr of 0.86 mg/dL).  Assessment:  83 yr old female on Coumadin prior to admission for hx DVT and protein C deficiency.  Coumadin held since admitted due to supratherapeutic INRs.  INR right at upper limit of usual therapeutic range today (3.01), but patient and husband report usual INR goal is 1.5-2.0 per hematologist in [place], [place].  Dopplers negative for new DVT. Heme notes may need bone marrow biopsy at some point, but deferring for now.  Home Coumadin regimen: 2 mg MWF, 1 mg TTSS. Last dose 3/11 prior to admission.  Goal of Therapy:  INR 1.5-2.0 per patient , per her hematologist in [place]. Monitor platelets by anticoagulation protocol: Yes   Plan:   No Coumadin again today.  Daily PT/INR.  *Patient requested that she be allowed to take just  325 mg of Tylenol (650 mg ordered), which usually works for her hip pain. I modified the order.  She also reports that she doesn't need Senokot, and has been refusing scheduled doses.  Arty Baumgartner, [place] Pager: [phone] 11/18/2016,2:50 PM

## 2016-11-18 NOTE — Evaluation (Signed)
Occupational Therapy Evaluation Patient Details Name: Vanessa Page MRN: [id number] DOB: [birth year] Today's Date: 11/18/2016    History of Present Illness Vanessa Page is a 83 y.o. female with medical history significant of bronchiectasis factor C def, history of DVT lower extremities,  hyponatremia. Pt admitted with fever and tachycardia   Clinical Impression   Pt has most recently required assist with ADL. Currently pt requires min assist for functional mobility and min-mod assist for ADL. Pt presenting with generalized weakness, decreased activity tolerance, deconditioning, and poor standing balance impacting her independence and safety with ADL and functional mobility. Recommending SNF for follow up to maximize independence and safety with ADL and functional mobility prior to return home. Pt would benefit from continued skilled OT to address established goals.    Follow Up Recommendations  SNF;Supervision/Assistance - 24 hour    Equipment Recommendations  None recommended by OT    Recommendations for Other Services       Precautions / Restrictions Precautions Precautions: Fall Restrictions Weight Bearing Restrictions: No      Mobility Bed Mobility               General bed mobility comments: Pt OOB in chair upon arrival  Transfers Overall transfer level: Needs assistance Equipment used: Rolling walker (2 wheeled) Transfers: Sit to/from Stand Sit to Stand: Min assist         General transfer comment: Min assist to boost up from chair and toilet. Cues for hand placement and technique    Balance Overall balance assessment: Needs assistance Sitting-balance support: Feet supported;No upper extremity supported Sitting balance-Leahy Scale: Good Sitting balance - Comments: sat edge of chair x 1 min without LOB   Standing balance support: No upper extremity supported;During functional activity Standing balance-Leahy Scale: Fair Standing balance comment:  Able to stand at sink and wash hands without UE support                            ADL Overall ADL's : Needs assistance/impaired Eating/Feeding: Set up;Sitting   Grooming: Standing;Wash/dry hands;Minimal assistance   Upper Body Bathing: Minimal assistance;Sitting   Lower Body Bathing: Moderate assistance;Sit to/from stand   Upper Body Dressing : Minimal assistance;Sitting   Lower Body Dressing: Moderate assistance;Sit to/from stand   Toilet Transfer: Minimal assistance;Ambulation;Regular Toilet;Grab bars;RW   Toileting- Clothing Manipulation and Hygiene: Minimal assistance;Sit to/from stand Toileting - Clothing Manipulation Details (indicate cue type and reason): assist for sit to stand, pt able to perform peri care     Functional mobility during ADLs: Minimal assistance;Rolling walker       Vision         Perception     Praxis      Pertinent Vitals/Pain Pain Assessment: No/denies pain      Hand Dominance Right   Extremity/Trunk Assessment Upper Extremity Assessment Upper Extremity Assessment: Generalized weakness   Lower Extremity Assessment Lower Extremity Assessment: Defer to PT evaluation   Cervical / Trunk Assessment Cervical / Trunk Assessment: Kyphotic   Communication Communication Communication: No difficulties   Cognition Arousal/Alertness: Awake/alert Behavior During Therapy: WFL for tasks assessed/performed Overall Cognitive Status: Within Functional Limits for tasks assessed                     General Comments  pt transferred to commode in bathroom and was able to perform hygeine independently in seated, however, required minA to power up from commode    Exercises  Shoulder Instructions      Home Living Family/patient expects to be discharged to:: Skilled nursing facility Living Arrangements: Spouse/significant other Available Help at Discharge: Family;Available 24 hours/day Type of Home: Independent living  facility Home Access: Level entry     Home Layout: One level     Bathroom Shower/Tub: Occupational psychologist: Handicapped height Bathroom Accessibility: Yes   Home Equipment: [place] - single point;Bedside commode;Grab bars - toilet;Hand held shower head;Grab bars - tub/shower;Shower seat          Prior Functioning/Environment Level of Independence: Needs assistance  Gait / Transfers Assistance Needed: used a RW since last admission ADL's / Homemaking Assistance Needed: spouse and home health agency assisted pt with bathing and dressing            OT Problem List: Decreased strength;Decreased activity tolerance;Impaired balance (sitting and/or standing);Decreased knowledge of use of DME or AE      OT Treatment/Interventions: Self-care/ADL training;Therapeutic exercise;Energy conservation;DME and/or AE instruction;Therapeutic activities;Patient/family education;Balance training    OT Goals(Current goals can be found in the care plan section) Acute Rehab OT Goals Patient Stated Goal: rehab then home OT Goal Formulation: With patient/family Time For Goal Achievement: 12/02/16 Potential to Achieve Goals: Good ADL Goals Pt Will Perform Grooming: with supervision;standing Pt Will Perform Lower Body Bathing: with supervision;sit to/from stand Pt Will Perform Lower Body Dressing: with supervision;sit to/from stand Pt Will Transfer to Toilet: with supervision;ambulating;regular height toilet;grab bars Pt Will Perform Toileting - Clothing Manipulation and hygiene: with supervision;sit to/from stand  OT Frequency: Min 2X/week   Barriers to D/C:            Co-evaluation              End of Session Equipment Utilized During Treatment: Gait belt;Rolling walker  Activity Tolerance: Patient tolerated treatment well Patient left: in chair;with call bell/phone within reach;with family/visitor present  OT Visit Diagnosis: Unsteadiness on feet (R26.81);Muscle  weakness (generalized) (M62.81)                ADL either performed or assessed with clinical judgement  Time: 1640-1704 OT Time Calculation (min): 24 min Charges:  OT General Charges $OT Visit: 1 Procedure OT Evaluation $OT Eval Moderate Complexity: 1 Procedure OT Treatments $Self Care/Home Management : 8-22 mins G-Codes:     Mel Almond A. Ulice Brilliant, M.S., OTR/L Pager: [place] 11/18/2016, 5:21 PM

## 2016-11-18 NOTE — Progress Notes (Signed)
PROGRESS NOTE    Vanessa Page  EHU:[id number] DOB: 06-05-1931 DOA: 11/16/2016 PCP: Pcp Not In System    Brief Narrative:  Vanessa Page is a 83 y.o. female with medical history significant of bronchiectasis factor C def, history of DVT lower extremities,  hyponatremia    Presented with diffuse weakness fevers after 101.3 patient was noted to tachycardia Dr. 110 she endorses lower extremity edema bilaterally on room air oxygen saturation was 90% on the mass arrival and she was started on 3 L. Since her admission she has been gradually getting weaker. Despite home PT never got better. States the weakness is worse in lower extremities. But arms have improved. She has had bilateral leg edema. Denies any sick contacts. NO diarrhea. She has been more sleepy recently.  States the decreased exercise tolerance started 6 weeks ago after she had Colonoscopy (showed only polyp) since then have had dyspnea with activity and progressive weakness.  Patient was recently admitted for UTI associated diffuse weakness she was prior to this on Bactrim and thought this was secondary to antibiotics given myalgias felt to be serum sickness-like presentation neurology was consulted and felt that she has no ongoing GBS, stating he likes symptoms she was discharged home with PT OT home health. TI was treated with IV ceftriaxone she was discharged on oral Keflex Patient was discharged home 2/27 of February  Of note patient has known history of venous stasis ulcer on the right lower extremity that been healing well. Otherwise she hasn't had any nausea or vomiting no diarrhea no abdominal pain no cough no shortness of breath also throat no chest pain  Assessment & Plan:  #1 systemic inflammatory response syndrome Questionable etiology. Patient presented with fever, tachycardia, leukocytosis, elevated lactic acid level that has trended down. Questionable etiology. CT abdomen and pelvis negative for any source of  infection. Abdominal x-ray unremarkable. Patient has been pancultured. Discussed with infectious disease, will stop empiric IV vancomycin and IV Zosyn. And monitor for next 24 hours  #2 hypertension Stable.  #3 leukocytosis Patient with a persistent leukocytosis unclear as to whether this is chronic. Patient recently hospitalized with improvement with leukocytosis With white count trending down to 19.1 by day of discharge while on antibiotics. Patient presenting back with a worsening leukocytosis with white count of 29.5. Patient has been pancultured. Patient is not toxic appearing. Patient noted on CBC to have atypical lymphocytes. Patient also presented with myalgias and generalized weakness. Patient with elevated sedimentation rate of 70. Patient noted to have albumin level of 1.6.  hematology consulted for further evaluation and managemen no further input.  #4 hyponatremia Resolved.  #5 history of Protein C deficiency/lower extremity DVT Patient on chronic anticoagulation. INR supratherapeutic. Coumadin per pharmacy.  #6 lower extremity edema Lower extremity Dopplers negative for DVT. 2-D echo with EF of 65-70%, no wall motion abnormalities, grade 2 diastolic dysfunction. Patient also noted to have hypoalbuminemia. Patient with a normal renal function. LFTs essentially within normal limits. CT abdomen and pelvis negative for any acute abnormalities.  7. Myositis. Patient does have elevation of aldolase with normal CK. Also primary complaint is generalized muscle ache primarily located in lower extremities. At this point I would send out workup for myositis and monitor. Supportive management.  8. Gingivitis. We'll provide oral vitamins and monitor.   DVT prophylaxis: Supratherapeutic INR. On Coumadin. Code Status: DO NOT RESUSCITATE Family Communication: Updated patient and family at bedside. Disposition Plan: Home once medically stable workup completed, afebrile with resolution of  leukocytosis.   Consultants:   Hematology/oncology pending  Procedures:   CT abdomen and pelvis 11/17/2016  Abdominal x-ray 11/17/2016  Chest x-ray 11/16/2016  2-D echo 11/17/2016  LE Dopplers 11/17/2016  Antimicrobials:   IV Zosyn 11/17/2016  Vancomycin 11/17/2016   Subjective: Continues to have muscle aches, feels tired. Minimal oral intake due to mouth ulcers.  Objective: Vitals:   11/17/16 0332 11/17/16 1957 11/18/16 0320 11/18/16 1418  BP: (!) 113/53 (!) 125/55 127/67 (!) 115/53  Pulse: (!) 110 87    Resp: 20 19 18 18   Temp: 98.1 F (36.7 C) 98.6 F (37 C) 98.7 F (37.1 C) 97.4 F (36.3 C)  TempSrc: Oral Axillary Oral Oral  SpO2: 94% 97% 98% 98%  Weight:      Height:        Intake/Output Summary (Last 24 hours) at 11/18/16 1926 Last data filed at 11/18/16 1700  Gross per 24 hour  Intake              770 ml  Output             1200 ml  Net             -430 ml   Filed Weights   11/16/16 1817 11/17/16 0031  Weight: 56.2 kg (124 lb) 57.2 kg (126 lb 3.2 oz)    Examination:  General exam: Appears calm and comfortable  Respiratory system: Clear to auscultation. Respiratory effort normal. Cardiovascular system: S1 & S2 heard, RRR. No JVD, murmurs, rubs, gallops or clicks. 1+ BLE edema Gastrointestinal system: Abdomen is nondistended, soft and nontender. No organomegaly or masses felt. Normal bowel sounds heard. Central nervous system: Alert and oriented. No focal neurological deficits. Extremities: Symmetric 5 x 5 power. Skin: No rashes, lesions or ulcers Psychiatry: Judgement and insight appear normal. Mood & affect appropriate.     Data Reviewed: I have personally reviewed following labs and imaging studies  CBC:  Recent Labs Lab 11/16/16 1820 11/17/16 0449 11/18/16 0425  WBC 29.5* 26.9* 24.6*  NEUTROABS 25.0* 22.3* 19.8*  HGB 12.5 10.2* 10.3*  HCT 37.0 30.6* 31.5*  MCV 82.0 81.6 82.0  PLT 515* 474* 182*   Basic Metabolic  Panel:  Recent Labs Lab 11/16/16 1820 11/17/16 0450 11/18/16 0425  NA 132* 132* 133*  K 4.1 3.2* 3.4*  CL 93* 99* 98*  CO2 26 25 26   GLUCOSE 133* 117* 118*  BUN 18 12 11   CREATININE 0.95 0.80 0.86  CALCIUM 8.3* 7.1* 7.5*  MG  --  1.7  --   PHOS  --  3.0  --    GFR: Estimated Creatinine Clearance: 39.3 mL/min (by C-G formula based on SCr of 0.86 mg/dL). Liver Function Tests:  Recent Labs Lab 11/16/16 1820 11/17/16 0450 11/18/16 0425  AST 36 44* 48*  ALT 26 27 32  ALKPHOS 120 118 120  BILITOT 0.6 0.8 0.8  PROT 6.1* 4.9* 5.0*  ALBUMIN 2.2* 1.6* 1.6*   No results for input(s): LIPASE, AMYLASE in the last 168 hours. No results for input(s): AMMONIA in the last 168 hours. Coagulation Profile:  Recent Labs Lab 11/16/16 2153 11/17/16 0450 11/18/16 0425  INR 3.13 3.36 3.01   Cardiac Enzymes:  Recent Labs Lab 11/16/16 2341 11/17/16 0450 11/17/16 1133  CKTOTAL 20*  --   --   TROPONINI 0.06* 0.03* 0.04*   BNP (last 3 results) No results for input(s): PROBNP in the last 8760 hours. HbA1C: No results for input(s): HGBA1C in the last  72 hours. CBG: No results for input(s): GLUCAP in the last 168 hours. Lipid Profile: No results for input(s): CHOL, HDL, LDLCALC, TRIG, CHOLHDL, LDLDIRECT in the last 72 hours. Thyroid Function Tests:  Recent Labs  11/17/16 0449  TSH 2.072   Anemia Panel: No results for input(s): VITAMINB12, FOLATE, FERRITIN, TIBC, IRON, RETICCTPCT in the last 72 hours. Sepsis Labs:  Recent Labs Lab 11/16/16 1834 11/16/16 2200 11/16/16 2226 11/18/16 0425  PROCALCITON  --   --  0.45 0.38  LATICACIDVEN 2.48* 1.28  --   --     Recent Results (from the past 240 hour(s))  Blood Culture (routine x 2)     Status: None (Preliminary result)   Collection Time: 11/16/16  6:25 PM  Result Value Ref Range Status   Specimen Description BLOOD RIGHT HAND  Final   Special Requests BOTTLES DRAWN AEROBIC ONLY 5CC  Final   Culture NO GROWTH 2 DAYS   Final   Report Status PENDING  Incomplete  Blood Culture (routine x 2)     Status: None (Preliminary result)   Collection Time: 11/16/16  6:55 PM  Result Value Ref Range Status   Specimen Description BLOOD LEFT HAND  Final   Special Requests BOTTLES DRAWN AEROBIC ONLY 10CC  Final   Culture NO GROWTH 2 DAYS  Final   Report Status PENDING  Incomplete  Gram stain     Status: None   Collection Time: 11/16/16  7:39 PM  Result Value Ref Range Status   Specimen Description URINE, CATHETERIZED  Final   Special Requests NONE  Final   Gram Stain   Final    SQUAMOUS EPITHELIAL CELLS PRESENT WBC PRESENT, PREDOMINANTLY PMN GRAM POSITIVE RODS GRAM POSITIVE COCCI IN PAIRS CYTOSPIN SMEAR    Report Status 11/16/2016 FINAL  Final  Urine culture     Status: None   Collection Time: 11/16/16  7:39 PM  Result Value Ref Range Status   Specimen Description URINE, CATHETERIZED  Final   Special Requests NONE  Final   Culture NO GROWTH  Final   Report Status 11/17/2016 FINAL  Final  Respiratory Panel by PCR     Status: None   Collection Time: 11/16/16  9:55 PM  Result Value Ref Range Status   Adenovirus NOT DETECTED NOT DETECTED Final   Coronavirus 229E NOT DETECTED NOT DETECTED Final   Coronavirus HKU1 NOT DETECTED NOT DETECTED Final   Coronavirus NL63 NOT DETECTED NOT DETECTED Final   Coronavirus OC43 NOT DETECTED NOT DETECTED Final   Metapneumovirus NOT DETECTED NOT DETECTED Final   Rhinovirus / Enterovirus NOT DETECTED NOT DETECTED Final   Influenza A NOT DETECTED NOT DETECTED Final   Influenza B NOT DETECTED NOT DETECTED Final   Parainfluenza Virus 1 NOT DETECTED NOT DETECTED Final   Parainfluenza Virus 2 NOT DETECTED NOT DETECTED Final   Parainfluenza Virus 3 NOT DETECTED NOT DETECTED Final   Parainfluenza Virus 4 NOT DETECTED NOT DETECTED Final   Respiratory Syncytial Virus NOT DETECTED NOT DETECTED Final   Bordetella pertussis NOT DETECTED NOT DETECTED Final   Chlamydophila pneumoniae NOT  DETECTED NOT DETECTED Final   Mycoplasma pneumoniae NOT DETECTED NOT DETECTED Final         Radiology Studies: Dg Chest 2 View  Result Date: 11/16/2016 CLINICAL DATA:  83 year old female with history of shortness of breath for the past 2 weeks, worsening on exertion. History bronchiectasis. EXAM: CHEST  2 VIEW COMPARISON:  Chest x-ray 11/01/2016. FINDINGS: No consolidative airspace disease. Small  left pleural effusion blunting the left costophrenic sulcus. No right pleural effusion. No evidence of pulmonary edema. No definite suspicious appearing pulmonary nodules or masses. Heart size is normal. The patient is rotated to the left on today's exam, resulting in distortion of the mediastinal contours and reduced diagnostic sensitivity and specificity for mediastinal pathology. Atherosclerosis in the thoracic aorta. Bilateral apical pleuroparenchymal thickening, partially calcified, most compatible with chronic post infectious or inflammatory scarring. IMPRESSION: 1. Small left pleural effusion. 2. Aortic atherosclerosis. Electronically Signed   By: Vinnie Langton M.D.   On: 11/16/2016 20:21   Dg Abd 1 View  Result Date: 11/17/2016 CLINICAL DATA:  Constipation. EXAM: ABDOMEN - 1 VIEW COMPARISON:  CT 11/17/2016 FINDINGS: Oral contrast material noted in the right colon and transverse colon. Contrast also noted within the urinary bladder from prior IV contrast administration. No evidence of bowel obstruction or free air. No organomegaly. No acute bony abnormality appear IMPRESSION: No acute findings. Electronically Signed   By: Rolm Baptise M.D.   On: 11/17/2016 07:59   Ct Abdomen Pelvis W Contrast  Result Date: 11/17/2016 CLINICAL DATA:  83 year old hypertensive female with systemic inflammatory response. History deep venous thrombosis. Recent urinary tract infection. Leukocytosis. Fever. Initial encounter. EXAM: CT ABDOMEN AND PELVIS WITH CONTRAST TECHNIQUE: Multidetector CT imaging of the abdomen  and pelvis was performed using the standard protocol following bolus administration of intravenous contrast. CONTRAST:  100 cc Isovue 300. COMPARISON:  No comparison CT. FINDINGS: Lower chest: Bronchiectasis lower right middle lobe. Basilar subsegmental atelectasis. Trace left pleural effusion/ pleural thickening. Heart slightly enlarged.  Mitral valve calcifications. Hepatobiliary: 2.1 cm cyst dome of liver. Posterior dome liver 1.7 cm hemangioma suspected. No calcified gallstone. Pancreas: No pancreatic mass. Spleen: No mass or enlargement. Adrenals/Urinary Tract: Mild hyperplasia adrenal glands. No renal or ureteral obstructing stone or hydronephrosis. Small cysts right lower pole. Noncontrast filled imaging of the urinary bladder without gross abnormality. Stomach/Bowel: Small hiatal hernia. Under distended stomach without gross abnormality. Minimal haziness of fat planes suggestive of third spacing of fluid giving subcutaneous appearance rather than related to primary bowel inflammatory process. No free air or drainable abscess. Moderate stool rectosigmoid region. Vascular/Lymphatic: Atherosclerotic changes aorta with ectasia without aneurysmal dilation. Atherosclerotic changes iliac arteries with slight ectasia and narrowing. No obvious thrombosis of the iliac veins. No adenopathy. Reproductive: Endometrial lining measures 3 mm. If the patient had any vaginal bleeding than this can be assessed with pelvic sonogram. Tiny left ovarian cysts. Other: Mild third spacing of fluid. Musculoskeletal: Post left intertrochanteric fixation with streak artifact. Scoliosis lumbar spine convex left with disc space narrowing greatest on the right at the L2-3 level and on the left at the L4-5 level. IMPRESSION: Mild third spacing of fluid without source of patient's fever identified on the current exam. Right middle lobe bronchiectasis. Subsegmental basilar atelectasis with trace left pleural effusion/pleural thickening. Small  hemangioma posterior dome of the liver suspected. Aortic atherosclerosis. Endometrial lining thickness 3 mm and tiny left ovarian cyst possibly incidental findings. If there is any vaginal bleeding, pelvic sonogram may then be considered for further delineation. Scoliosis and degenerative changes. Electronically Signed   By: Genia Del M.D.   On: 11/17/2016 06:38        Scheduled Meds: . [START ON 11/19/2016] calcium carbonate  1 tablet Oral Q breakfast  . feeding supplement (ENSURE ENLIVE)  237 mL Oral BID BM  . fluticasone  2 spray Each Nare Daily  . senna  1 tablet Oral BID  .  sodium chloride flush  3 mL Intravenous Q12H  . Warfarin - Pharmacist Dosing Inpatient   Does not apply q1800   Continuous Infusions:   LOS: 2 days    Time spent: 40 minutes    Author:  Berle Mull, MD [facility] Pager: [phone] 11/18/2016 7:29 PM     If 7PM-7AM, please contact night-coverage [email or web address] Password [facility] 11/18/2016, 7:26 PM

## 2016-11-18 NOTE — Discharge Instructions (Signed)

## 2016-11-18 NOTE — Progress Notes (Signed)
Vanessa Page   DOB:[birth year]   DQ#:[id number]    Subjective: She feels well. No chills or fever. Her appetite is stable. I have reviewed outside fax. CBC from October 2016 and 2017 were within normal range  Objective:  Vitals:   11/18/16 0320 11/18/16 1418  BP: 127/67 (!) 115/53  Pulse:    Resp: 18 18  Temp: 98.7 F (37.1 C) 97.4 F (36.3 C)     Intake/Output Summary (Last 24 hours) at 11/18/16 1540 Last data filed at 11/18/16 1230  Gross per 24 hour  Intake              530 ml  Output             1200 ml  Net             -670 ml    GENERAL:alert, no distress and comfortable SKIN: skin color, texture, turgor are normal, no rashes or significant lesions EYES: normal, Conjunctiva are pink and non-injected, sclera clear Musculoskeletal:no cyanosis of digits and no clubbing  NEURO: alert & oriented x 3 with fluent speech, no focal motor/sensory deficits   Labs:  Lab Results  Component Value Date   WBC 24.6 (H) 11/18/2016   HGB 10.3 (L) 11/18/2016   HCT 31.5 (L) 11/18/2016   MCV 82.0 11/18/2016   PLT 490 (H) 11/18/2016   NEUTROABS 19.8 (H) 11/18/2016    Lab Results  Component Value Date   NA 133 (L) 11/18/2016   K 3.4 (L) 11/18/2016   CL 98 (L) 11/18/2016   CO2 26 11/18/2016    Studies:  Dg Chest 2 View  Result Date: 11/16/2016 CLINICAL DATA:  83 year old female with history of shortness of breath for the past 2 weeks, worsening on exertion. History bronchiectasis. EXAM: CHEST  2 VIEW COMPARISON:  Chest x-ray 11/01/2016. FINDINGS: No consolidative airspace disease. Small left pleural effusion blunting the left costophrenic sulcus. No right pleural effusion. No evidence of pulmonary edema. No definite suspicious appearing pulmonary nodules or masses. Heart size is normal. The patient is rotated to the left on today's exam, resulting in distortion of the mediastinal contours and reduced diagnostic sensitivity and specificity for mediastinal pathology. Atherosclerosis in  the thoracic aorta. Bilateral apical pleuroparenchymal thickening, partially calcified, most compatible with chronic post infectious or inflammatory scarring. IMPRESSION: 1. Small left pleural effusion. 2. Aortic atherosclerosis. Electronically Signed   By: Vinnie Langton M.D.   On: 11/16/2016 20:21   Dg Abd 1 View  Result Date: 11/17/2016 CLINICAL DATA:  Constipation. EXAM: ABDOMEN - 1 VIEW COMPARISON:  CT 11/17/2016 FINDINGS: Oral contrast material noted in the right colon and transverse colon. Contrast also noted within the urinary bladder from prior IV contrast administration. No evidence of bowel obstruction or free air. No organomegaly. No acute bony abnormality appear IMPRESSION: No acute findings. Electronically Signed   By: Rolm Baptise M.D.   On: 11/17/2016 07:59   Ct Abdomen Pelvis W Contrast  Result Date: 11/17/2016 CLINICAL DATA:  83 year old hypertensive female with systemic inflammatory response. History deep venous thrombosis. Recent urinary tract infection. Leukocytosis. Fever. Initial encounter. EXAM: CT ABDOMEN AND PELVIS WITH CONTRAST TECHNIQUE: Multidetector CT imaging of the abdomen and pelvis was performed using the standard protocol following bolus administration of intravenous contrast. CONTRAST:  100 cc Isovue 300. COMPARISON:  No comparison CT. FINDINGS: Lower chest: Bronchiectasis lower right middle lobe. Basilar subsegmental atelectasis. Trace left pleural effusion/ pleural thickening. Heart slightly enlarged.  Mitral valve calcifications. Hepatobiliary: 2.1 cm cyst dome  of liver. Posterior dome liver 1.7 cm hemangioma suspected. No calcified gallstone. Pancreas: No pancreatic mass. Spleen: No mass or enlargement. Adrenals/Urinary Tract: Mild hyperplasia adrenal glands. No renal or ureteral obstructing stone or hydronephrosis. Small cysts right lower pole. Noncontrast filled imaging of the urinary bladder without gross abnormality. Stomach/Bowel: Small hiatal hernia. Under  distended stomach without gross abnormality. Minimal haziness of fat planes suggestive of third spacing of fluid giving subcutaneous appearance rather than related to primary bowel inflammatory process. No free air or drainable abscess. Moderate stool rectosigmoid region. Vascular/Lymphatic: Atherosclerotic changes aorta with ectasia without aneurysmal dilation. Atherosclerotic changes iliac arteries with slight ectasia and narrowing. No obvious thrombosis of the iliac veins. No adenopathy. Reproductive: Endometrial lining measures 3 mm. If the patient had any vaginal bleeding than this can be assessed with pelvic sonogram. Tiny left ovarian cysts. Other: Mild third spacing of fluid. Musculoskeletal: Post left intertrochanteric fixation with streak artifact. Scoliosis lumbar spine convex left with disc space narrowing greatest on the right at the L2-3 level and on the left at the L4-5 level. IMPRESSION: Mild third spacing of fluid without source of patient's fever identified on the current exam. Right middle lobe bronchiectasis. Subsegmental basilar atelectasis with trace left pleural effusion/pleural thickening. Small hemangioma posterior dome of the liver suspected. Aortic atherosclerosis. Endometrial lining thickness 3 mm and tiny left ovarian cyst possibly incidental findings. If there is any vaginal bleeding, pelvic sonogram may then be considered for further delineation. Scoliosis and degenerative changes. Electronically Signed   By: Genia Del M.D.   On: 11/17/2016 06:38    Assessment & Plan:  Acute leukocytosis (normal CBC in [place] in October 2017) Reactive thrombocytosis Recent fever and chills, resolved on broad spectrum IV antibiotics Recent colonoscopy with biopsy Recently foot ulcer History of chronic bronchiectasis Deconditioning  Overall presentation is highly suspicious for infectious etiology as a cause of acute leukocytosis and thrombocytosis.  She is responding to IV antibiotics with  resolution of some of her symptoms. The thrombocytosis appear reactive in nature It is possible that she may have bacterial translocation from recent colonoscopy and biopsy Unfortunately, with repeated courses of antibiotic therapy, all her cultures so far were negative  We discussed the risk and benefit of bone marrow aspirate and biopsy to exclude myeloproliferative disorder Again, I do not favor this as a potential diagnosis because of the timing of events I would imaging primary service will discontinue IV vancomycin with negative culture soon and transition her to oral antibiotics. I would recommend something broad spectrum such as Augmentin or Levaquin in view that she did not get better with Bactrim or Keflex She desire to go back to [place] for SNF I recommend follow-up with her oncologist within 2-3 weeks of discharge Will sign off. Call if questions arise   Heath Lark, MD 11/18/2016  3:40 PM

## 2016-11-19 ENCOUNTER — Inpatient Hospital Stay (HOSPITAL_COMMUNITY): Payer: Medicare Other

## 2016-11-19 DIAGNOSIS — Z881 Allergy status to other antibiotic agents status: Secondary | ICD-10-CM

## 2016-11-19 DIAGNOSIS — M7989 Other specified soft tissue disorders: Secondary | ICD-10-CM

## 2016-11-19 DIAGNOSIS — Z96698 Presence of other orthopedic joint implants: Secondary | ICD-10-CM

## 2016-11-19 DIAGNOSIS — Z885 Allergy status to narcotic agent status: Secondary | ICD-10-CM

## 2016-11-19 LAB — CBC WITH DIFFERENTIAL/PLATELET
BASOS PCT: 0 %
Basophils Absolute: 0 10*3/uL (ref 0.0–0.1)
EOS PCT: 2 %
Eosinophils Absolute: 0.5 10*3/uL (ref 0.0–0.7)
HEMATOCRIT: 32.5 % — AB (ref 36.0–46.0)
HEMOGLOBIN: 10.8 g/dL — AB (ref 12.0–15.0)
LYMPHS ABS: 2.1 10*3/uL (ref 0.7–4.0)
LYMPHS PCT: 8 %
MCH: 27.1 pg (ref 26.0–34.0)
MCHC: 33.2 g/dL (ref 30.0–36.0)
MCV: 81.5 fL (ref 78.0–100.0)
Monocytes Absolute: 2.1 10*3/uL — ABNORMAL HIGH (ref 0.1–1.0)
Monocytes Relative: 8 %
NEUTROS ABS: 21.3 10*3/uL — AB (ref 1.7–7.7)
Neutrophils Relative %: 82 %
Platelets: 549 10*3/uL — ABNORMAL HIGH (ref 150–400)
RBC: 3.99 MIL/uL (ref 3.87–5.11)
RDW: 14.7 % (ref 11.5–15.5)
WBC: 26 10*3/uL — ABNORMAL HIGH (ref 4.0–10.5)

## 2016-11-19 LAB — PROTIME-INR
INR: 2.34
PROTHROMBIN TIME: 26.1 s — AB (ref 11.4–15.2)

## 2016-11-19 MED ORDER — WARFARIN SODIUM 1 MG PO TABS
0.5000 mg | ORAL_TABLET | Freq: Once | ORAL | Status: AC
  Administered 2016-11-19: 0.5 mg via ORAL
  Filled 2016-11-19: qty 1

## 2016-11-19 MED ORDER — B COMPLEX-C PO TABS
1.0000 | ORAL_TABLET | Freq: Every day | ORAL | Status: DC
Start: 2016-11-19 — End: 2016-11-21
  Administered 2016-11-19 – 2016-11-21 (×3): 1 via ORAL
  Filled 2016-11-19 (×3): qty 1

## 2016-11-19 MED ORDER — SACCHAROMYCES BOULARDII 250 MG PO CAPS
250.0000 mg | ORAL_CAPSULE | Freq: Two times a day (BID) | ORAL | Status: DC
  Administered 2016-11-19 – 2016-11-21 (×5): 250 mg via ORAL
  Filled 2016-11-19 (×5): qty 1

## 2016-11-19 MED ORDER — VALACYCLOVIR HCL 500 MG PO TABS
500.0000 mg | ORAL_TABLET | Freq: Two times a day (BID) | ORAL | Status: DC
  Administered 2016-11-19 – 2016-11-21 (×5): 500 mg via ORAL
  Filled 2016-11-19 (×5): qty 1

## 2016-11-19 MED ORDER — SODIUM CHLORIDE 0.9 % IV BOLUS (SEPSIS)
500.0000 mL | Freq: Once | INTRAVENOUS | Status: AC
  Administered 2016-11-19: 500 mL via INTRAVENOUS

## 2016-11-19 NOTE — Consult Note (Signed)
[facility]         Reason for Consult: Persistent Leukocytosis     Referring Physician: Dr. Posey Pronto  Principal Problem:   SIRS (systemic inflammatory response syndrome) ([place]) Active Problems:   Chronic anticoagulation   History of DVT of lower extremity   Bronchiectasis (HCC)   Essential hypertension   Leukocytosis   Hyponatremia   Murmur  HPI: Vanessa Page is a 83 y.o. female with PMHx of Bronchiectasis, Protein C Deficiency, History of DVT on Coumadin, Left Hip fracture s/p fixation who was recently admitted in February 2018 with myalgias and weakness. Per patient, she was in excellent health prior to her routine colonoscopy on 10/21/16 when she started to feel poorly one week later with myalgias, arthralgias, decreased appetite, and weakness. This progressed and she was seen by her outpatient PCP and diagnosed with a right medial venous insufficiency ulcer, it was draining some and she was started on Bactrim. Patient came with her husband to [place] from [place] to visit her family. Her weakness continued to progress until she bedbound, at this time she came to [facility]. She admitted to slight urinary incontinence without dysuria or urinary frequency and UA showed many bacteria, small leukocytes, but negative nitrites (UA was also dirty with 6-30 squams) and was started on Ceftriaxone and transitioned to Keflex. She reported her weakness worsened after taking Bactrim outpatient. During that admission, Neurology was consulted and did not feel her symptoms were consistent with GBS or Myasthenia. Working diagnosis was serum sickness 2/2 Bactrim or UTI, patient improved and was discharge home.   Patient presented to the ED on 3/12 with complaint of diffuse weakness and fever. In the ED, patient was found to be febrile to 101.3, which resolved and has not recurred. She was tachycardic to 119 but normotensive. Lactic acid was elevated at 2.4, WBC elevated at 29 with  increased neutrophils and monocytes. Patient was started on Vancomycin and Zosyn which have since been discontinued. Pertinent labs include: Elevated ESR at 42, low CK at 24/31, ANA negative, acetylcholine ab negative, aldolase elevated at 11, LDH normal, TSH normal, BCx NGTD, UCx NGTD, RVP negative, influenza negative. Peripheral blood smear showed leukocytosis with neutrophilia. Anti-smith, Sjogren, and anti-Jo pending. Of note, patient has had mildly elevated AST, normal ALT.   Of note, patient had a persistent leukocytosis neutrophil predominant during an admission for trochanteric fracture in April 2015. Leukocytosis was 16 >20>19>16>14 during admission. No source of infection was found and leukocytosis was attributed to stress from recent operation. Outside records indicated a normal CBC in 2016 and 2017.  Past Medical History:  Diagnosis Date  . Aortic regurgitation 12/24/2013   moderate  . Bronchiectasis ([place])   . Bursitis of right shoulder   . DVT (deep venous thrombosis) ([place])   . 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 Mitral regurgitation 12/24/2013   mild    Allergies:  Allergies  Allergen Reactions  . Other Other (See Comments)    Patient has an intolerance to Augmentin,Zithromax,Bioxin,Avalox, & Erythromycin. She can not take them unless they are needed for Bronchiectasis  . Codeine Other (See Comments)    Unknown reaction    Current antibiotics: Valacyclovir 3/15 >> Zosyn 3/12 >>3/14 Vancomycin 3/12 >>3/13  MEDICATIONS: . B-complex with vitamin C  1 tablet Oral Daily  . calcium carbonate  1 tablet Oral Q breakfast  . feeding supplement (ENSURE ENLIVE)  237 mL Oral BID BM  .  fluticasone  2 spray Each Nare Daily  . folic acid  1 mg Oral Daily  . magic mouthwash w/lidocaine  10 mL Oral TID  . saccharomyces boulardii  250 mg Oral BID  . sodium chloride flush  3 mL Intravenous Q12H  . valACYclovir  500 mg Oral BID  . Warfarin  - Pharmacist Dosing Inpatient   Does not apply q1800    Social History  Substance Use Topics  . Smoking status: Never Smoker  . Smokeless tobacco: Never Used  . Alcohol use No     Comment: occasional wine     Family History  Problem Relation Age of Onset  . Prostate cancer Father     Review of Systems: A complete ROS was negative except as per HPI.   OBJECTIVE: Vitals:   11/18/16 0320 11/18/16 1418 11/18/16 2013 11/19/16 0458  BP: 127/67 (!) 115/53 (!) 127/55 (!) 147/58  Pulse:   97 (!) 119  Resp: 18 18 18 18   Temp: 98.7 F (37.1 C) 97.4 F (36.3 C) 98.8 F (37.1 C) 99 F (37.2 C)  TempSrc: Oral Oral Oral Oral  SpO2: 98% 98% 99% 95%  Weight:      Height:       General: Vital signs reviewed.  Patient is elderly, frail, in no acute distress and cooperative with exam.  Head: Normocephalic and atraumatic. Eyes: PERRLA, conjunctivae normal, no scleral icterus.  Mouth: Glossitis Neck: Supple, trachea midline, no JVD Cardiovascular: Tachycardic, regular rhythm, S1 normal, S2 normal, 2/6 SEM Pulmonary/Chest: Clear to auscultation bilaterally, no wheezes, rales, or rhonchi. Abdominal: Soft, non-tender, non-distended, BS + Musculoskeletal: Diffuse weakness and tenderness on palpation of muscles, particularly calf muscles, deltoid, biceps, quadricepts.  Extremities: Trace lower extremity edema bilaterally,  pulses symmetric and intact bilaterally.  Neurological: A&O x3, Strength is decreased 3-4/5 throughout, cranial nerve II-XII are grossly intact, sensory intact to light touch bilaterally.  Skin: No rashes erythema. Small medial stasis ulcer on right lower extremity, well healed.  Psychiatric: Normal mood and affect. speech and behavior is normal. Cognition and memory are normal.    LABS: Results for orders placed or performed during the hospital encounter of 11/16/16 (from the past 48 hour(s))  Procalcitonin     Status: None   Collection Time: 11/18/16  4:25 AM  Result  Value Ref Range   Procalcitonin 0.38 ng/mL    Comment:        Interpretation: PCT (Procalcitonin) <= 0.5 ng/mL: Systemic infection (sepsis) is not likely. Local bacterial infection is possible. (NOTE)         ICU PCT Algorithm               Non ICU PCT Algorithm    ----------------------------     ------------------------------         PCT < 0.25 ng/mL                 PCT < 0.1 ng/mL     Stopping of antibiotics            Stopping of antibiotics       strongly encouraged.               strongly encouraged.    ----------------------------     ------------------------------       PCT level decrease by               PCT < 0.25 ng/mL       >= 80% from peak PCT  OR PCT 0.25 - 0.5 ng/mL          Stopping of antibiotics                                             encouraged.     Stopping of antibiotics           encouraged.    ----------------------------     ------------------------------       PCT level decrease by              PCT >= 0.25 ng/mL       < 80% from peak PCT        AND PCT >= 0.5 ng/mL            Continuin g antibiotics                                              encouraged.       Continuing antibiotics            encouraged.    ----------------------------     ------------------------------     PCT level increase compared          PCT > 0.5 ng/mL         with peak PCT AND          PCT >= 0.5 ng/mL             Escalation of antibiotics                                          strongly encouraged.      Escalation of antibiotics        strongly encouraged.   Protime-INR     Status: Abnormal   Collection Time: 11/18/16  4:25 AM  Result Value Ref Range   Prothrombin Time 31.9 (H) 11.4 - 15.2 seconds   INR 3.01   CBC with Differential/Platelet     Status: Abnormal   Collection Time: 11/18/16  4:25 AM  Result Value Ref Range   WBC 24.6 (H) 4.0 - 10.5 K/uL   RBC 3.84 (L) 3.87 - 5.11 MIL/uL   Hemoglobin 10.3 (L) 12.0 - 15.0 g/dL   HCT 31.5 (L) 36.0 - 46.0 %   MCV  82.0 78.0 - 100.0 fL   MCH 26.8 26.0 - 34.0 pg   MCHC 32.7 30.0 - 36.0 g/dL   RDW 14.8 11.5 - 15.5 %   Platelets 490 (H) 150 - 400 K/uL   Neutrophils Relative % 80 %   Neutro Abs 19.8 (H) 1.7 - 7.7 K/uL   Lymphocytes Relative 8 %   Lymphs Abs 2.0 0.7 - 4.0 K/uL   Monocytes Relative 8 %   Monocytes Absolute 1.9 (H) 0.1 - 1.0 K/uL   Eosinophils Relative 4 %   Eosinophils Absolute 0.9 (H) 0.0 - 0.7 K/uL   Basophils Relative 0 %   Basophils Absolute 0.0 0.0 - 0.1 K/uL  Save smear     Status: None   Collection Time: 11/18/16  4:25 AM  Result Value Ref Range   Smear Review SMEAR STAINED AND AVAILABLE FOR REVIEW  Comprehensive metabolic panel     Status: Abnormal   Collection Time: 11/18/16  4:25 AM  Result Value Ref Range   Sodium 133 (L) 135 - 145 mmol/L   Potassium 3.4 (L) 3.5 - 5.1 mmol/L   Chloride 98 (L) 101 - 111 mmol/L   CO2 26 22 - 32 mmol/L   Glucose, Bld 118 (H) 65 - 99 mg/dL   BUN 11 6 - 20 mg/dL   Creatinine, Ser 0.86 0.44 - 1.00 mg/dL   Calcium 7.5 (L) 8.9 - 10.3 mg/dL   Total Protein 5.0 (L) 6.5 - 8.1 g/dL   Albumin 1.6 (L) 3.5 - 5.0 g/dL   AST 48 (H) 15 - 41 U/L   ALT 32 14 - 54 U/L   Alkaline Phosphatase 120 38 - 126 U/L   Total Bilirubin 0.8 0.3 - 1.2 mg/dL   GFR calc non Af Amer >60 >60 mL/min   GFR calc Af Amer >60 >60 mL/min    Comment: (NOTE) The eGFR has been calculated using the CKD EPI equation. This calculation has not been validated in all clinical situations. eGFR's persistently <60 mL/min signify possible Chronic Kidney Disease.    Anion gap 9 5 - 15  Protime-INR     Status: Abnormal   Collection Time: 11/19/16  2:31 AM  Result Value Ref Range   Prothrombin Time 26.1 (H) 11.4 - 15.2 seconds   INR 2.34   CBC with Differential/Platelet     Status: Abnormal   Collection Time: 11/19/16  6:12 AM  Result Value Ref Range   WBC 26.0 (H) 4.0 - 10.5 K/uL   RBC 3.99 3.87 - 5.11 MIL/uL   Hemoglobin 10.8 (L) 12.0 - 15.0 g/dL   HCT 32.5 (L) 36.0 - 46.0  %   MCV 81.5 78.0 - 100.0 fL   MCH 27.1 26.0 - 34.0 pg   MCHC 33.2 30.0 - 36.0 g/dL   RDW 14.7 11.5 - 15.5 %   Platelets 549 (H) 150 - 400 K/uL   Neutrophils Relative % 82 %   Lymphocytes Relative 8 %   Monocytes Relative 8 %   Eosinophils Relative 2 %   Basophils Relative 0 %   Neutro Abs 21.3 (H) 1.7 - 7.7 K/uL   Lymphs Abs 2.1 0.7 - 4.0 K/uL   Monocytes Absolute 2.1 (H) 0.1 - 1.0 K/uL   Eosinophils Absolute 0.5 0.0 - 0.7 K/uL   Basophils Absolute 0.0 0.0 - 0.1 K/uL   Smear Review MORPHOLOGY UNREMARKABLE     MICRO: BCx 2/25 >> NGTD UCx 2/25 >> insignificant growth BCx 3/12 >> NGTD UCx 3/12 >> NGTD RVP negative Influenza negative  IMAGING: No results found.  HISTORICAL MICRO/IMAGING CT Abdomen/Pelvis:  Mild third spacing of fluid without source of patient's fever identified on the current exam. Right middle lobe bronchiectasis. Subsegmental basilar atelectasis with trace left pleural effusion/pleural thickening. Small hemangioma posterior dome of the liver suspected. Aortic atherosclerosis. Endometrial lining thickness 3 mm and tiny left ovarian cyst possibly incidental findings. If there is any vaginal bleeding, pelvic sonogram may then be considered for further delineation. Scoliosis and degenerative changes.  MRI Brain wo contrast Atrophy and moderate chronic ischemic change.  No acute abnormality Bilateral mastoid sinus effusion.  Assessment/Plan:   Vanessa Page is a 83 y.o. female with PMHx of Bronchiectasis, Protein C Deficiency, History of DVT on Coumadin, Left Hip fracture s/p fixation who is admitted with weakness, myalgias, arthralgias and persistent leukocytosis.   Leukocytosis: In the setting of  weakness, myalgias, arthralgias. No evidence of infectious etiology given extensive negative work up. Differential includes rheumatologic disorders- possibly PMR, polymyositis, myositis, Sjogren's. PMR, while not necessarily associated with leukocytosis could  explain the myalgias and arthralgias, especially if dramatically improved with prednisone. Could also consider viral infection such as CMV, EBV, Parvovirus, HIV, Hepatitis C.  -Observe off of antibiotics -Check CMV, EBV, Parvovirus, HIV, Hepatitis C ab -Anti-Smith Ab, Sjogren's Syndrome labs, Anti-Jo -Consider trial of NSAIDs such as Naproxen 500 mg BID WC to assess for anti-inflammatory response  Reactive Thrombocytosis: Platelets normal in 2015.   History of DVT: On Coumadin. [place] negative for DVT.  -Continue Coumadin  Chronic Bronchiectasis: No increased shortness of breath or cough. CXR with persistent RML bronchiectasis.  -Per primary  Martyn Malay, DO PGY-3 Internal Medicine Resident Pager # [phone] 11/19/2016 5:22 PM

## 2016-11-19 NOTE — Progress Notes (Signed)
[place] for Coumadin Indication: h/o DVT  Allergies  Allergen Reactions  . Other Other (See Comments)    Patient has an intolerance to Augmentin,Zithromax,Bioxin,Avalox, & Erythromycin. She can not take them unless they are needed for Bronchiectasis  . Codeine Other (See Comments)    Unknown reaction    Patient Measurements: Height: 5' (152.4 cm) Weight: 126 lb 3.2 oz (57.2 kg) IBW/kg (Calculated) : 45.5  Vital Signs: Temp: 99 F (37.2 C) (03/15 0458) Temp Source: Oral (03/15 0458) BP: 147/58 (03/15 0458) Pulse Rate: 119 (03/15 0458)  Labs:  Recent Labs  11/16/16 1820  11/16/16 2341 11/17/16 0449 11/17/16 0450 11/17/16 1133 11/18/16 0425 11/19/16 0231 11/19/16 0612  HGB 12.5  --   --  10.2*  --   --  10.3*  --  10.8*  HCT 37.0  --   --  30.6*  --   --  31.5*  --  32.5*  PLT 515*  --   --  474*  --   --  490*  --  549*  LABPROT  --   < >  --   --  34.8*  --  31.9* 26.1*  --   INR  --   < >  --   --  3.36  --  3.01 2.34  --   CREATININE 0.95  --   --   --  0.80  --  0.86  --   --   CKTOTAL  --   --  20*  --   --   --   --   --   --   TROPONINI  --   --  0.06*  --  0.03* 0.04*  --   --   --   < > = values in this interval not displayed.  Estimated Creatinine Clearance: 39.3 mL/min (by C-G formula based on SCr of 0.86 mg/dL).   Medical History: Past Medical History:  Diagnosis Date  . Aortic regurgitation 12/24/2013   moderate  . Bronchiectasis ([place])   . Bursitis of right shoulder   . DVT (deep venous thrombosis) ([place])   . 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 Mitral regurgitation 12/24/2013   mild    Assessment: 83yo female continuing Coumadin as PTA for h/o DVT. PMH of Factor C deficiency followed by Heme. INR above goal at 3.13 on admit, now trending down to 2.34 after held doses x 2 days, remains above goal set by patient's hematologist in [place] (INR 1.5-2.0). Will give small  dose today with large INR drop overnight and expect to trend down further after held doses. CBC stable, no bleed documented.  Home warfarin dose: 2mg  on MWF, 1mg  AOD  Goal of Therapy:  INR 1.5-2.0 per patient, per her hematologist in [place] Monitor platelets by anticoagulation protocol:  yes   Plan:  Warfarin 0.5mg  x 1 dose tonight Daily INR Monitor CBC, s/sx bleeding   Elicia Lamp, PharmD, BCPS Clinical Pharmacist 11/19/2016 10:26 AM

## 2016-11-19 NOTE — Progress Notes (Signed)
[facility] Progress Note  Patient: Vanessa Page RUE:[id number]   PCP: Pcp Not In System DOB: 1931/04/30   DOA: 11/16/2016   DOS: 11/19/2016   Date of Service: the patient was seen and examined on 11/19/2016  Subjective: Has diarrhea which is getting better, left arm swelling with pain, remains fatigued and tired. Gingivitis is improving. Her intake remains minimal  Brief hospital course: Pt. with PMH of protein C deficiency, bronchiectasis, recurrent DVT on Coumadin; admitted on 11/16/2016, with complaint of leg pain with fever and tachycardia, was found to have sirs. Since the workup so far has been performed and is unremarkable other than possibility of myositis Currently further plan is further workup for myositis.  Assessment and Plan: 1 systemic inflammatory response syndrome Unclear etiology. Patient presented with fever, tachycardia, leukocytosis, elevated lactic acid level that has trended down. CT abdomen and pelvis negative for any source of infection. Chest x-ray as well as x-ray abdomen unremarkable. Patient has been pancultured, no growth.  appreciate input from infectious disease, will stop empiric IV vancomycin and IV Zosyn. And monitor  2 hypertension Stable.  3 leukocytosis Patient with a persistent leukocytosis during this hospitalization, prior CBC were showing normal WBCs when not in distress. Patient is not toxic appearing.  elevated sedimentation rate of 70. Patient noted to have albumin level of 1.6.  hematology consulted, feels her leukocytosis is reactive and currently recommends no further workup.  4 hyponatremia Resolved.  5 history of Protein C deficiency/lower extremity DVT Patient on chronic anticoagulation. INR supratherapeutic. Coumadin per pharmacy.  6 lower extremity edema Lower extremity Dopplers negative for DVT. 2-D echo with EF of 65-70%, no wall motion abnormalities, grade 2 diastolic dysfunction. Patient also noted to have  hypoalbuminemia. Patient with a normal renal function. LFTs essentially within normal limits. CT abdomen and pelvis negative for any acute abnormalities.  7.  myalgia Patient does have elevation of aldolase with normal CK. Also primary complaint is generalized muscle ache primarily located in lower extremities. At this point I would send out workup for myositis and monitor. Supportive management. Discussed with neurology, for this patient does not have enough evidence of myositis at present, recommend no further workup  8. Gingivitis. We'll provide oral vitamins and monitor.  Bowel regimen: last BM 11/19/2016 Diet: Regular diet DVT Prophylaxis: warfarin   Advance goals of care discussion: DNR/DNI  Family Communication: family was present at bedside, at the time of interview. The pt provided permission to discuss medical plan with the family. Opportunity was given to ask question and all questions were answered satisfactorily.   Disposition:  Discharge to home. Expected discharge date: 11/21/2016  Consultants: infectious disease, Procedures: Echocardiogram   Antibiotics: Anti-infectives    Start     Dose/Rate Route Frequency Ordered Stop   11/19/16 1315  valACYclovir (VALTREX) tablet 500 mg     500 mg Oral 2 times daily 11/19/16 1304 11/26/16 0959   11/17/16 2000  vancomycin (VANCOCIN) IVPB 750 mg/150 ml premix  Status:  Discontinued     750 mg 150 mL/hr over 60 Minutes Intravenous Every 24 hours 11/16/16 1935 11/18/16 1612   11/17/16 0200  piperacillin-tazobactam (ZOSYN) IVPB 3.375 g  Status:  Discontinued     3.375 g 12.5 mL/hr over 240 Minutes Intravenous Every 8 hours 11/16/16 1935 11/18/16 1612   11/16/16 1915  piperacillin-tazobactam (ZOSYN) IVPB 3.375 g     3.375 g 100 mL/hr over 30 Minutes Intravenous  Once 11/16/16 1901 11/16/16 2017   11/16/16 1915  vancomycin (  VANCOCIN) IVPB 1000 mg/200 mL premix     1,000 mg 200 mL/hr over 60 Minutes Intravenous  Once 11/16/16  1901 11/16/16 2042       Objective: Physical Exam: Vitals:   11/18/16 0320 11/18/16 1418 11/18/16 2013 11/19/16 0458  BP: 127/67 (!) 115/53 (!) 127/55 (!) 147/58  Pulse:   97 (!) 119  Resp: 18 18 18 18   Temp: 98.7 F (37.1 C) 97.4 F (36.3 C) 98.8 F (37.1 C) 99 F (37.2 C)  TempSrc: Oral Oral Oral Oral  SpO2: 98% 98% 99% 95%  Weight:      Height:        Intake/Output Summary (Last 24 hours) at 11/19/16 1654 Last data filed at 11/19/16 0459  Gross per 24 hour  Intake              240 ml  Output              600 ml  Net             -360 ml   Filed Weights   11/16/16 1817 11/17/16 0031  Weight: 56.2 kg (124 lb) 57.2 kg (126 lb 3.2 oz)   General: Alert, Awake and Oriented to Time, Place and Person. Appear in mild distress, affect appropriate Eyes: PERRL, Conjunctiva normal ENT: Oral Mucosa erythema moist. Neck: no JVD, no Abnormal Mass Or lumps Cardiovascular: S1 and S2 Present, no Murmur, Respiratory: Bilateral Air entry equal and Decreased, no use of accessory muscle, Clear to Auscultation, no Crackles, no wheezes Abdomen: Bowel Sound present, Soft and no tenderness Skin: no redness, no Rash, no induration Extremities: trace Pedal edema, no calf tenderness Neurologic: Grossly no focal neuro deficit. Bilaterally Equal motor strength  Data Reviewed: CBC:  Recent Labs Lab 11/16/16 1820 11/17/16 0449 11/18/16 0425 11/19/16 0612  WBC 29.5* 26.9* 24.6* 26.0*  NEUTROABS 25.0* 22.3* 19.8* 21.3*  HGB 12.5 10.2* 10.3* 10.8*  HCT 37.0 30.6* 31.5* 32.5*  MCV 82.0 81.6 82.0 81.5  PLT 515* 474* 490* 633*   Basic Metabolic Panel:  Recent Labs Lab 11/16/16 1820 11/17/16 0450 11/18/16 0425  NA 132* 132* 133*  K 4.1 3.2* 3.4*  CL 93* 99* 98*  CO2 26 25 26   GLUCOSE 133* 117* 118*  BUN 18 12 11   CREATININE 0.95 0.80 0.86  CALCIUM 8.3* 7.1* 7.5*  MG  --  1.7  --   PHOS  --  3.0  --     Liver Function Tests:  Recent Labs Lab 11/16/16 1820 11/17/16 0450  11/18/16 0425  AST 36 44* 48*  ALT 26 27 32  ALKPHOS 120 118 120  BILITOT 0.6 0.8 0.8  PROT 6.1* 4.9* 5.0*  ALBUMIN 2.2* 1.6* 1.6*   No results for input(s): LIPASE, AMYLASE in the last 168 hours. No results for input(s): AMMONIA in the last 168 hours. Coagulation Profile:  Recent Labs Lab 11/16/16 2153 11/17/16 0450 11/18/16 0425 11/19/16 0231  INR 3.13 3.36 3.01 2.34   Cardiac Enzymes:  Recent Labs Lab 11/16/16 2341 11/17/16 0450 11/17/16 1133  CKTOTAL 20*  --   --   TROPONINI 0.06* 0.03* 0.04*   BNP (last 3 results) No results for input(s): PROBNP in the last 8760 hours. CBG: No results for input(s): GLUCAP in the last 168 hours. Studies: No results found.  Scheduled Meds: . B-complex with vitamin C  1 tablet Oral Daily  . calcium carbonate  1 tablet Oral Q breakfast  . feeding supplement (ENSURE ENLIVE)  237 mL Oral BID BM  . fluticasone  2 spray Each Nare Daily  . folic acid  1 mg Oral Daily  . magic mouthwash w/lidocaine  10 mL Oral TID  . saccharomyces boulardii  250 mg Oral BID  . sodium chloride flush  3 mL Intravenous Q12H  . valACYclovir  500 mg Oral BID  . warfarin  0.5 mg Oral ONCE-1800  . Warfarin - Pharmacist Dosing Inpatient   Does not apply q1800   Continuous Infusions: PRN Meds: acetaminophen **OR** acetaminophen, HYDROcodone-acetaminophen, iopamidol, ondansetron **OR** ondansetron (ZOFRAN) IV, polyethylene glycol  Time spent: 35 minutes  Author: Berle Mull, MD [facility] Pager: [phone] 11/19/2016 4:54 PM  If 7PM-7AM, please contact night-coverage at [email or web address], password [facility]

## 2016-11-19 NOTE — Progress Notes (Signed)
VASCULAR LAB PRELIMINARY  PRELIMINARY  PRELIMINARY  PRELIMINARY  Left upper extremity venous duplex completed.    Preliminary report:  There is no DVT or SVT noted in the left upper extremity.  Savreen Gebhardt, RVT 11/19/2016, 2:30 PM

## 2016-11-20 LAB — CBC WITH DIFFERENTIAL/PLATELET
Basophils Absolute: 0 10*3/uL (ref 0.0–0.1)
Basophils Relative: 0 %
EOS ABS: 1.1 10*3/uL — AB (ref 0.0–0.7)
Eosinophils Relative: 4 %
HEMATOCRIT: 35 % — AB (ref 36.0–46.0)
HEMOGLOBIN: 11.3 g/dL — AB (ref 12.0–15.0)
LYMPHS PCT: 7 %
Lymphs Abs: 1.9 10*3/uL (ref 0.7–4.0)
MCH: 26.8 pg (ref 26.0–34.0)
MCHC: 32.3 g/dL (ref 30.0–36.0)
MCV: 83.1 fL (ref 78.0–100.0)
MONOS PCT: 8 %
Monocytes Absolute: 2.2 10*3/uL — ABNORMAL HIGH (ref 0.1–1.0)
NEUTROS ABS: 22.4 10*3/uL — AB (ref 1.7–7.7)
Neutrophils Relative %: 81 %
Platelets: 538 10*3/uL — ABNORMAL HIGH (ref 150–400)
RBC: 4.21 MIL/uL (ref 3.87–5.11)
RDW: 15.3 % (ref 11.5–15.5)
WBC: 27.6 10*3/uL — ABNORMAL HIGH (ref 4.0–10.5)

## 2016-11-20 LAB — SJOGRENS SYNDROME-A EXTRACTABLE NUCLEAR ANTIBODY: SSA (Ro) (ENA) Antibody, IgG: 0.2 AI (ref 0.0–0.9)

## 2016-11-20 LAB — ANTI-JO 1 ANTIBODY, IGG

## 2016-11-20 LAB — PROTIME-INR
INR: 2.01
PROTHROMBIN TIME: 23.1 s — AB (ref 11.4–15.2)

## 2016-11-20 LAB — SJOGRENS SYNDROME-B EXTRACTABLE NUCLEAR ANTIBODY

## 2016-11-20 LAB — ANTI-SMITH ANTIBODY: ENA SM Ab Ser-aCnc: <0.2 AI (ref 0.0–0.9)

## 2016-11-20 LAB — PROCALCITONIN: Procalcitonin: 0.41 ng/mL

## 2016-11-20 LAB — HIV ANTIBODY (ROUTINE TESTING W REFLEX): HIV Screen 4th Generation wRfx: NONREACTIVE

## 2016-11-20 MED ORDER — RISAQUAD PO CAPS
1.0000 | ORAL_CAPSULE | Freq: Every day | ORAL | Status: DC
  Administered 2016-11-20 – 2016-11-21 (×2): 1 via ORAL
  Filled 2016-11-20 (×2): qty 1

## 2016-11-20 MED ORDER — LORAZEPAM 0.5 MG PO TABS
0.5000 mg | ORAL_TABLET | Freq: Once | ORAL | Status: AC
  Administered 2016-11-20: 0.5 mg via ORAL
  Filled 2016-11-20: qty 1

## 2016-11-20 MED ORDER — WARFARIN SODIUM 1 MG PO TABS
1.0000 mg | ORAL_TABLET | Freq: Once | ORAL | Status: AC
  Administered 2016-11-20: 1 mg via ORAL
  Filled 2016-11-20: qty 1

## 2016-11-20 MED ORDER — SODIUM CHLORIDE 0.9 % IV SOLN
INTRAVENOUS | Status: AC
  Administered 2016-11-20: 14:00:00 via INTRAVENOUS

## 2016-11-20 MED ORDER — PREDNISONE 20 MG PO TABS
20.0000 mg | ORAL_TABLET | Freq: Every day | ORAL | Status: DC
  Administered 2016-11-20 – 2016-11-21 (×2): 20 mg via ORAL
  Filled 2016-11-20 (×2): qty 1

## 2016-11-20 MED ORDER — LOPERAMIDE HCL 2 MG PO CAPS
2.0000 mg | ORAL_CAPSULE | ORAL | Status: DC | PRN
  Administered 2016-11-20 (×3): 2 mg via ORAL
  Filled 2016-11-20 (×3): qty 1

## 2016-11-20 MED ORDER — PRO-STAT SUGAR FREE PO LIQD
30.0000 mL | Freq: Two times a day (BID) | ORAL | Status: DC
  Administered 2016-11-20 – 2016-11-21 (×3): 30 mL via ORAL
  Filled 2016-11-20 (×2): qty 30

## 2016-11-20 MED ORDER — LOPERAMIDE HCL 2 MG PO CAPS: 2.0000 mg | ORAL_CAPSULE | Freq: Once | ORAL | Status: DC

## 2016-11-20 MED ORDER — MAGIC MOUTHWASH W/LIDOCAINE
10.0000 mL | Freq: Four times a day (QID) | ORAL | Status: DC
  Administered 2016-11-20 – 2016-11-21 (×3): 10 mL via ORAL
  Filled 2016-11-20 (×5): qty 10

## 2016-11-20 NOTE — Progress Notes (Signed)
Pt requesting for something to help her sleep. On call NP notified. Orders placed, Will continue to monitor. Isac Caddy, RN

## 2016-11-20 NOTE — Progress Notes (Signed)
[facility] Progress Note  Patient: Vanessa Page UJW:[id number]   PCP: Pcp Not In System DOB: [birth year]   DOA: 11/16/2016   DOS: 11/20/2016   Date of Service: the patient was seen and examined on 11/20/2016  Subjective: 3-4 loose watery BM, no abdominal pain or cramps. No fever no chills. Oral intake improving. No nausea no vomiting.  Brief hospital course: Pt. with PMH of protein C deficiency, bronchiectasis, recurrent DVT on Coumadin; admitted on 11/16/2016, with complaint of leg pain with fever and tachycardia, was found to have sirs. Since the workup so far has been performed and is unremarkable other than possibility of myositis Currently further plan is further workup for myositis.  Assessment and Plan: 1 systemic inflammatory response syndrome Unclear etiology. Patient presented with fever, tachycardia, leukocytosis, elevated lactic acid level that has trended down. CT abdomen and pelvis negative for any source of infection. Chest x-ray as well as x-ray abdomen unremarkable. Patient has been pancultured, no growth.  appreciate input from infectious disease, stop empiric IV vancomycin and IV Zosyn. And monitor  2 hypertension Stable.  3 leukocytosis Patient with a persistent leukocytosis during this hospitalization, prior CBC were showing normal WBCs when not in distress. Patient is not toxic appearing.  elevated sedimentation rate of 70. Patient noted to have albumin level of 1.6.  hematology consulted, feels her leukocytosis is reactive and currently recommends no further workup.  4 hyponatremia Resolved.  5 history of Protein C deficiency/lower extremity DVT Patient on chronic anticoagulation. INR supratherapeutic. Coumadin per pharmacy.  6 lower extremity edema Lower extremity Dopplers negative for DVT. 2-D echo with EF of 65-70%, no wall motion abnormalities, grade 2 diastolic dysfunction. Patient also noted to have hypoalbuminemia. Patient with a normal  renal function. LFTs essentially within normal limits. CT abdomen and pelvis negative for any acute abnormalities.  7.  myalgia Patient does have elevation of aldolase with normal CK. Also primary complaint is generalized muscle ache primarily located in lower extremities. At this point for myositis seems to be unremarkable. We'll start her on low-dose prednisone 20 mg daily. Supportive management. Discussed with neurology, for this patient does not have enough evidence of myositis at present, recommend no further workup  8. Gingivitis. We'll provide oral vitamins and monitor. Continue Magic mouthwash.  9. Diarrhea. Discuss with ID: No concern for C. difficile given no other associated symptoms. We'll monitor. Start on Imodium when necessary. Continue also give gentle IV hydration.  10. Fever blister. No indication for isolation at present. Currently requested. On Valtrex for 7 days.  Bowel regimen: last BM 11/20/2016 Diet: Regular diet DVT Prophylaxis: warfarin   Advance goals of care discussion: DNR/DNI  Family Communication: family was present at bedside, at the time of interview. The pt provided permission to discuss medical plan with the family. Opportunity was given to ask question and all questions were answered satisfactorily.   Disposition:  Discharge to home. Expected discharge date: 11/21/2016  Consultants: infectious disease, hematology Procedures: Echocardiogram   Antibiotics: Anti-infectives    Start     Dose/Rate Route Frequency Ordered Stop   11/19/16 1315  valACYclovir (VALTREX) tablet 500 mg     500 mg Oral 2 times daily 11/19/16 1304 11/26/16 0959   11/17/16 2000  vancomycin (VANCOCIN) IVPB 750 mg/150 ml premix  Status:  Discontinued     750 mg 150 mL/hr over 60 Minutes Intravenous Every 24 hours 11/16/16 1935 11/18/16 1612   11/17/16 0200  piperacillin-tazobactam (ZOSYN) IVPB 3.375 g  Status:  Discontinued  3.375 g 12.5 mL/hr over 240 Minutes  Intravenous Every 8 hours 11/16/16 1935 11/18/16 1612   11/16/16 1915  piperacillin-tazobactam (ZOSYN) IVPB 3.375 g     3.375 g 100 mL/hr over 30 Minutes Intravenous  Once 11/16/16 1901 11/16/16 2017   11/16/16 1915  vancomycin (VANCOCIN) IVPB 1000 mg/200 mL premix     1,000 mg 200 mL/hr over 60 Minutes Intravenous  Once 11/16/16 1901 11/16/16 2042     Objective: Physical Exam: Vitals:   11/19/16 2033 11/19/16 2101 11/20/16 0352 11/20/16 1252  BP: (!) 123/58  (!) 138/54 (!) 124/56  Pulse: 99  85 90  Resp: 18  18   Temp: 100.2 F (37.9 C) 99.3 F (37.4 C) 97.9 F (36.6 C)   TempSrc: Oral Oral Oral   SpO2: 99%  99% 97%  Weight:      Height:        Intake/Output Summary (Last 24 hours) at 11/20/16 1631 Last data filed at 11/20/16 0551  Gross per 24 hour  Intake                0 ml  Output              900 ml  Net             -900 ml   Filed Weights   11/16/16 1817 11/17/16 0031  Weight: 56.2 kg (124 lb) 57.2 kg (126 lb 3.2 oz)   General: Alert, Awake and Oriented to Time, Place and Person. Appear in mild distress, affect appropriate Eyes: PERRL, Conjunctiva normal ENT: Oral Mucosa erythema moist. Neck: no JVD, no Abnormal Mass Or lumps Cardiovascular: S1 and S2 Present, no Murmur, Respiratory: Bilateral Air entry equal and Decreased, no use of accessory muscle, Clear to Auscultation, no Crackles, no wheezes Abdomen: Bowel Sound present, Soft and no tenderness Skin: no redness, no Rash, no induration Extremities: trace Pedal edema, no calf tenderness Neurologic: Grossly no focal neuro deficit. Bilaterally Equal motor strength  Data Reviewed: CBC:  Recent Labs Lab 11/16/16 1820 11/17/16 0449 11/18/16 0425 11/19/16 0612 11/20/16 0516  WBC 29.5* 26.9* 24.6* 26.0* 27.6*  NEUTROABS 25.0* 22.3* 19.8* 21.3* 22.4*  HGB 12.5 10.2* 10.3* 10.8* 11.3*  HCT 37.0 30.6* 31.5* 32.5* 35.0*  MCV 82.0 81.6 82.0 81.5 83.1  PLT 515* 474* 490* 549* 921*   Basic Metabolic  Panel:  Recent Labs Lab 11/16/16 1820 11/17/16 0450 11/18/16 0425  NA 132* 132* 133*  K 4.1 3.2* 3.4*  CL 93* 99* 98*  CO2 26 25 26   GLUCOSE 133* 117* 118*  BUN 18 12 11   CREATININE 0.95 0.80 0.86  CALCIUM 8.3* 7.1* 7.5*  MG  --  1.7  --   PHOS  --  3.0  --     Liver Function Tests:  Recent Labs Lab 11/16/16 1820 11/17/16 0450 11/18/16 0425  AST 36 44* 48*  ALT 26 27 32  ALKPHOS 120 118 120  BILITOT 0.6 0.8 0.8  PROT 6.1* 4.9* 5.0*  ALBUMIN 2.2* 1.6* 1.6*   No results for input(s): LIPASE, AMYLASE in the last 168 hours. No results for input(s): AMMONIA in the last 168 hours. Coagulation Profile:  Recent Labs Lab 11/16/16 2153 11/17/16 0450 11/18/16 0425 11/19/16 0231 11/20/16 0516  INR 3.13 3.36 3.01 2.34 2.01   Cardiac Enzymes:  Recent Labs Lab 11/16/16 2341 11/17/16 0450 11/17/16 1133  CKTOTAL 20*  --   --   TROPONINI 0.06* 0.03* 0.04*   BNP (last 3  results) No results for input(s): PROBNP in the last 8760 hours. CBG: No results for input(s): GLUCAP in the last 168 hours. Studies: No results found.  Scheduled Meds: . acidophilus  1 capsule Oral Daily  . B-complex with vitamin C  1 tablet Oral Daily  . calcium carbonate  1 tablet Oral Q breakfast  . feeding supplement (ENSURE ENLIVE)  237 mL Oral BID BM  . feeding supplement (PRO-STAT SUGAR FREE 64)  30 mL Oral BID  . fluticasone  2 spray Each Nare Daily  . folic acid  1 mg Oral Daily  . magic mouthwash w/lidocaine  10 mL Oral TID  . predniSONE  20 mg Oral Q breakfast  . saccharomyces boulardii  250 mg Oral BID  . sodium chloride flush  3 mL Intravenous Q12H  . valACYclovir  500 mg Oral BID  . warfarin  1 mg Oral ONCE-1800  . Warfarin - Pharmacist Dosing Inpatient   Does not apply q1800   Continuous Infusions: . sodium chloride 75 mL/hr at 11/20/16 1335   PRN Meds: acetaminophen **OR** acetaminophen, HYDROcodone-acetaminophen, iopamidol, loperamide, ondansetron **OR** ondansetron  (ZOFRAN) IV, polyethylene glycol  Time spent: 35 minutes  Author: Berle Mull, MD [facility] Pager: [phone] 11/20/2016 4:31 PM  If 7PM-7AM, please contact night-coverage at [email or web address], password [facility]

## 2016-11-20 NOTE — Clinical Social Work Note (Signed)
Clinical Social Work Assessment  Patient Details  Name: Vanessa Page MRN: [id number] Date of Birth: 1930/12/31  Date of referral:  11/20/16               Reason for consult:  Discharge Planning                Permission sought to share information with:  Family Supports Permission granted to share information::  Yes, Verbal Permission Granted  Name::     Vanessa Page  Agency::     Relationship::  spouse  Contact Information:  [phone]  Housing/Transportation Living arrangements for the past 2 months:  [place] of Information:  Patient, Adult Children Patient Interpreter Needed:  None Criminal Activity/Legal Involvement Pertinent to Current Situation/Hospitalization:  No - Comment as needed Significant Relationships:  Adult Children, Spouse Lives with:  Self, Spouse Do you feel safe going back to the place where you live?  Yes Need for family participation in patient care:  Yes (Comment)  Care giving concerns:  Patient lives in [place] with at an independent facility  Facilities manager / plan: CSW met patient and patients son at bedside to offer support and discuss patients needs at bedside. Patient and family is agreeable for patient to discharge to SNF to help patient get back to baseline. Patients son had requested patient be sent to [facility]. CSW to complete necessary paperwork and initiate SNF search on patient behalf. CSW to follow up with patent once bed offers are available. CSW remains available for support and to facilitate patient discharge needs.   Employment status:  Retired Forensic scientist:  Medicare PT Recommendations:  [place] / Referral to community resources:  [place]  Patient/Family's Response to care:  Patient verbalized appreciation and understanding for CSW role and involvement in care. Patient agreeable with current discharge plan to SNF   Patient/Family's  Understanding of and Emotional Response to Diagnosis, Current Treatment, and Prognosis:  Patient with good understand of current medical state and limitations around most recent hospitalization. Patient agreeable with SNF placement in hopes of going back home to [place]   Emotional Assessment Appearance:  Appears stated age Attitude/Demeanor/Rapport:    Affect (typically observed):  Other Orientation:  Oriented to  Time, Oriented to Situation, Oriented to Place, Oriented to Self Alcohol / Substance use:  Not Applicable Psych involvement (Current and /or in the community):  No (Comment)  Discharge Needs  Concerns to be addressed:  No discharge needs identified Readmission within the last 30 days:  Yes Current discharge risk:  None Barriers to Discharge:  No Barriers Identified   Wende Neighbors, LCSW 11/20/2016, 1:54 PM

## 2016-11-20 NOTE — Progress Notes (Signed)
Physical Therapy Treatment Patient Details Name: Vanessa Page MRN: [id number] DOB: 08-May-1931 Today's Date: 11/20/2016    History of Present Illness Vanessa Page is a 83 y.o. female with medical history significant of bronchiectasis factor C def, history of DVT lower extremities,  hyponatremia. Pt admitted with fever and tachycardia.  Dx of SIRS.     PT Comments    Pt reports feeling better today. She ambulated 25' x 2 with RW, distance limited by fatigue in RLE. Pt notes RLE is weaker than L in general. With manual muscle testing R hip flexion 3/5, L hip flexion 4/5, R knee extension +3/5, L knee extension +4/5. Instructed pt in BUE/LE strengthening exercises to be done independently.    Follow Up Recommendations  SNF;Supervision/Assistance - 24 hour     Equipment Recommendations  None recommended by PT    Recommendations for Other Services       Precautions / Restrictions Precautions Precautions: Fall Restrictions Weight Bearing Restrictions: No    Mobility  Bed Mobility               General bed mobility comments: Pt OOB in bathroom upon arrival  Transfers Overall transfer level: Needs assistance Equipment used: Rolling walker (2 wheeled) Transfers: Sit to/from Stand Sit to Stand: Min assist         General transfer comment: Min assist to boost up from chair and toilet. Cues for hand placement and technique  Ambulation/Gait Ambulation/Gait assistance: Min guard Ambulation Distance (Feet): 50 Feet Assistive device: Rolling walker (2 wheeled) Gait Pattern/deviations: Step-through pattern;Decreased stride length Gait velocity: decreased Gait velocity interpretation: Below normal speed for age/gender General Gait Details: 25' x 2 with seated rest break 2* R calf fatigue   Stairs            Wheelchair Mobility    Modified Rankin (Stroke Patients Only)       Balance Overall balance assessment: Needs assistance Sitting-balance  support: Feet supported;No upper extremity supported Sitting balance-Leahy Scale: Good     Standing balance support: No upper extremity supported;During functional activity Standing balance-Leahy Scale: Fair Standing balance comment: Able to stand at sink and wash hands without UE support                    Cognition Arousal/Alertness: Awake/alert Behavior During Therapy: WFL for tasks assessed/performed Overall Cognitive Status: Within Functional Limits for tasks assessed                      Exercises General Exercises - Lower Extremity Ankle Circles/Pumps: AROM;Both;10 reps;Seated Quad Sets: AROM;Both;10 reps;Supine Gluteal Sets: AROM;10 reps;Supine Long Arc Quad: AROM;10 reps;Seated;Both Hip Flexion/Marching: AROM;Both;5 reps;Seated Shoulder Exercises Shoulder Flexion: AROM;Both;10 reps;Seated    General Comments        Pertinent Vitals/Pain Pain Assessment: No/denies pain    Home Living                      Prior Function            PT Goals (current goals can now be found in the care plan section) Acute Rehab PT Goals Patient Stated Goal: rehab then home PT Goal Formulation: With patient/family Time For Goal Achievement: 12/01/16 Potential to Achieve Goals: Good Progress towards PT goals: Progressing toward goals    Frequency    Min 3X/week      PT Plan Current plan remains appropriate    Co-evaluation  End of Session Equipment Utilized During Treatment: Gait belt Activity Tolerance: Patient tolerated treatment well Patient left: in chair;with call bell/phone within reach;with family/visitor present Nurse Communication: Mobility status PT Visit Diagnosis: Unsteadiness on feet (R26.81);Other abnormalities of gait and mobility (R26.89);Muscle weakness (generalized) (M62.81);History of falling (Z91.81);Difficulty in walking, not elsewhere classified (R26.2);Pain Pain - Right/Left: Right Pain - part of body:  Leg     Time: 5259-1028 PT Time Calculation (min) (ACUTE ONLY): 29 min  Charges:  $Gait Training: 8-22 mins $Therapeutic Exercise: 8-22 mins                    G Codes:       Philomena Doheny 11/20/2016, 11:41 AM [phone]

## 2016-11-20 NOTE — Clinical Social Work Placement (Signed)
   CLINICAL SOCIAL WORK PLACEMENT  NOTE  Date:  11/20/2016  Patient Details  Name: Vanessa Page MRN: [id number] Date of Birth: Jul 23, 1931  Clinical Social Work is seeking post-discharge placement for this patient at the [place] level of care (*CSW will initial, date and re-position this form in  chart as items are completed):  Yes   Patient/family provided with [facility] Work Department's list of facilities offering this level of care within the geographic area requested by the patient (or if unable, by the patient's family).  Yes   Patient/family informed of their freedom to choose among providers that offer the needed level of care, that participate in Medicare, Medicaid or managed care program needed by the patient, have an available bed and are willing to accept the patient.  Yes   Patient/family informed of [facility]'s ownership interest in [facility] and [facility], as well as of the fact that they are under no obligation to receive care at these facilities.  PASRR submitted to EDS on       PASRR number received on       Existing PASRR number confirmed on       FL2 transmitted to all facilities in geographic area requested by pt/family on       FL2 transmitted to all facilities within larger geographic area on       Patient informed that his/her managed care company has contracts with or will negotiate with certain facilities, including the following:            Patient/family informed of bed offers received.  Patient chooses bed at       Physician recommends and patient chooses bed at      Patient to be transferred to   on  .  Patient to be transferred to facility by       Patient family notified on   of transfer.  Name of family member notified:        PHYSICIAN Please sign FL2     Additional Comment:    _______________________________________________ Wende Neighbors, LCSW 11/20/2016, 2:02 PM

## 2016-11-20 NOTE — Progress Notes (Signed)
ANTICOAGULATION CONSULT NOTE - Follow Up Consult  Pharmacy Consult for Coumadin Indication: h/o DVT. factor C deficiency   Allergies  Allergen Reactions  . Other Other (See Comments)    Patient has an intolerance to Augmentin,Zithromax,Bioxin,Avalox, & Erythromycin. She can not take them unless they are needed for Bronchiectasis  . Codeine Other (See Comments)    Unknown reaction    Patient Measurements: Height: 5' (152.4 cm) Weight: 126 lb 3.2 oz (57.2 kg) IBW/kg (Calculated) : 45.5 Heparin Dosing Weight:   Vital Signs: Temp: 97.9 F (36.6 C) (03/16 0352) Temp Source: Oral (03/16 0352) BP: 138/54 (03/16 0352) Pulse Rate: 85 (03/16 0352)  Labs:  Recent Labs  11/17/16 1133  11/18/16 0425 11/19/16 0231 11/19/16 0612 11/20/16 0516  HGB  --   < > 10.3*  --  10.8* 11.3*  HCT  --   --  31.5*  --  32.5* 35.0*  PLT  --   --  490*  --  549* 538*  LABPROT  --   --  31.9* 26.1*  --  23.1*  INR  --   --  3.01 2.34  --  2.01  CREATININE  --   --  0.86  --   --   --   TROPONINI 0.04*  --   --   --   --   --   < > = values in this interval not displayed.  Estimated Creatinine Clearance: 39.3 mL/min (by C-G formula based on SCr of 0.86 mg/dL).   Assessment:  Anticoag: Coumadin for h/o DVT. factor C deficiency followed by heme; INR 3.13 on admit 3/12, up to 3.36 on 3/13, now trend down INR 2.01. Held doses x 2 days. Dopplers negative for new DVT. Patient/husband report INR goal is 1.5-2.0 per hematologist in [place] - PTA dose: 2 mg on MWF, 1 mg TTSS (last dose 3/12 PtA)  Goal of Therapy:  INR 1.5-2 per patient Monitor platelets by anticoagulation protocol: Yes   Plan:  Warfarin 1 mg x 1 dose tonight Daily INR   Magin Balbi S. Alford Highland, PharmD, [facility] Clinical Staff Pharmacist Pager [phone]  Eilene Ghazi Stillinger 11/20/2016,10:48 AM

## 2016-11-20 NOTE — Progress Notes (Signed)
[facility] for Infectious Disease    Date of Admission:  11/16/2016    ID: Vanessa Page is a 83 y.o. female with PMHx of Bronchiectasis, Protein C Deficiency, History of DVT on Coumadin admitted with low-grade fever, persistent leukocytosis and a 2 month history of waxing/waning weakness and myalgias.  Principal Problem:   SIRS (systemic inflammatory response syndrome) (HCC) Active Problems:   Chronic anticoagulation   History of DVT of lower extremity   Bronchiectasis (HCC)   Essential hypertension   Leukocytosis   Hyponatremia   Murmur  Subjective: Patient was seen and examined this morning. Overnight, she has a low grade temperature of 100.2. Leukocytosis persists this morning at 27.6. Labs ordered yesterday and this morning for continued myositis and viral work up is pending. Overall, she feels some mild improvement in her weakness this morning with increased appetite. Her son feels she is similar to yesterday.   Medications:  . acidophilus  1 capsule Oral Daily  . B-complex with vitamin C  1 tablet Oral Daily  . calcium carbonate  1 tablet Oral Q breakfast  . feeding supplement (ENSURE ENLIVE)  237 mL Oral BID BM  . fluticasone  2 spray Each Nare Daily  . folic acid  1 mg Oral Daily  . magic mouthwash w/lidocaine  10 mL Oral TID  . predniSONE  20 mg Oral Q breakfast  . saccharomyces boulardii  250 mg Oral BID  . sodium chloride flush  3 mL Intravenous Q12H  . valACYclovir  500 mg Oral BID  . Warfarin - Pharmacist Dosing Inpatient   Does not apply q1800    Objective: Vitals:   11/19/16 0458 11/19/16 2033 11/19/16 2101 11/20/16 0352  BP: (!) 147/58 (!) 123/58  (!) 138/54  Pulse: (!) 119 99  85  Resp: 18 18  18   Temp: 99 F (37.2 C) 100.2 F (37.9 C) 99.3 F (37.4 C) 97.9 F (36.6 C)  TempSrc: Oral Oral Oral Oral  SpO2: 95% 99%  99%  Weight:      Height:       General: Vital signs reviewed.  Patient is elderly, in no acute distress and cooperative with  exam.  Cardiovascular: RRR, S1 normal, S2 normal, 2/6 SEM Pulmonary/Chest: Clear to auscultation bilaterally, no wheezes, rales, or rhonchi. Abdominal: Soft, non-tender, non-distended, BS +  Musculoskeletal: Mild tenderness throughout upper and lower extremities on palpation Extremities: Trace lower extremity edema bilaterally,  pulses symmetric and intact bilaterally.  Neurological: A&O x3, Strength is 5/5 in upper extremities, 5/5 in plantar and dorsiflexion, 3/5 in left hip flexion, 4/5 in left hip extension, 4/5 in right hip flexion and extension, sensory intact to light touch bilaterally.  Skin: Warm, dry and intact. Venous stasis ulcer on right medial lower extremity healing. Psychiatric: Normal mood and affect. speech and behavior is normal.    Lab Results  Recent Labs  11/18/16 0425 11/19/16 0612 11/20/16 0516  WBC 24.6* 26.0* 27.6*  HGB 10.3* 10.8* 11.3*  HCT 31.5* 32.5* 35.0*  NA 133*  --   --   K 3.4*  --   --   CL 98*  --   --   CO2 26  --   --   BUN 11  --   --   CREATININE 0.86  --   --    Liver Panel  Recent Labs  11/18/16 0425  PROT 5.0*  ALBUMIN 1.6*  AST 48*  ALT 32  ALKPHOS 120  BILITOT 0.8  Microbiology: BCx 2/25 >> NGTD UCx 2/25 >> insignificant growth BCx 3/12 >> NGTD UCx 3/12 >> NGTD RVP negative Influenza negative  Current antibiotics: Valacyclovir 3/15 >> Zosyn 3/12 >> 3/14 Vancomycin 3/12 >>3/13  Studies/Results: [place] Lt Upper Extrem Ltd Soft Tissue Non Vascular  Result Date: 11/20/2016 CLINICAL DATA:  Patient admitted 11/16/2016 with fever and tachycardia. Possible myositis. Pain and swelling about the left elbow for 3 days. EXAM: ULTRASOUND LEFT UPPER EXTREMITY LIMITED TECHNIQUE: Ultrasound examination of the upper extremity soft tissues was performed in the area of clinical concern. COMPARISON:  NONE. FINDINGS: Scanning was directed toward the region of concern. There is subcutaneous edema. No focal fluid collection is seen. No mass  is identified. IMPRESSION: Subcutaneous edema without focal fluid collection. Electronically Signed   By: Inge Rise M.D.   On: 11/20/2016 08:40   Assessment/Plan: Vanessa Page is a 83 y.o. female with PMHx of Bronchiectasis, Protein C Deficiency, History of DVT on Coumadin admitted with low-grade fever, persistent leukocytosis and a 2 month history of waxing/waning weakness and myalgias.  Persistent Leukocytosis: In the setting of weakness, low-grade fever, and myalgias. Patient is stable off of antibiotics. No evidence of infectious etiology given extensive negative work up. Differential includes rheumatologic disorders- possibly PMR, polymyositis, myositis, Sjogren's. PMR, while not necessarily associated with leukocytosis could explain the myalgias and arthralgias, especially if dramatically improved with prednisone. Could also consider viral infection such as CMV, EBV, Parvovirus, HIV, Hepatitis C. Other possible hematologic causes include Essential Thrombocytosis given elevated platelet count above 450 and can be associated with neutrophilia and non-specific symptoms such as fatigue. Patient was started on prednisone this morning.  -Observe off of antibiotics -CMV, EBV, Parvovirus, HIV, Hepatitis C ab pending -Anti-Smith Ab, Sjogren's Syndrome labs, Anti-Jo pending -Undergoing trial of low-dose prednisone to assess for anti-inflammatory response, okay to increase dose from ID standpoint -Consider further work up for Essential Thrombocytosis or other hematologic causes if the above labs are unremarkable  Thrombocytosis: Reactive 2/2 inflammatory responses versus Essential Thrombocytosis.  -Consider JAK2 testing  Martyn Malay, DO PGY-3 Internal Medicine Resident Pager # [phone] 11/20/2016 10:49 AM

## 2016-11-21 DIAGNOSIS — M353 Polymyalgia rheumatica: Principal | ICD-10-CM

## 2016-11-21 LAB — CULTURE, BLOOD (ROUTINE X 2)
Culture: NO GROWTH
Culture: NO GROWTH

## 2016-11-21 LAB — CBC WITH DIFFERENTIAL/PLATELET
BASOS ABS: 0 10*3/uL (ref 0.0–0.1)
BASOS PCT: 0 %
EOS PCT: 1 %
Eosinophils Absolute: 0.2 10*3/uL (ref 0.0–0.7)
HEMATOCRIT: 30.8 % — AB (ref 36.0–46.0)
HEMOGLOBIN: 10 g/dL — AB (ref 12.0–15.0)
LYMPHS PCT: 8 %
Lymphs Abs: 1.9 10*3/uL (ref 0.7–4.0)
MCH: 27 pg (ref 26.0–34.0)
MCHC: 32.5 g/dL (ref 30.0–36.0)
MCV: 83.2 fL (ref 78.0–100.0)
MONOS PCT: 7 %
Monocytes Absolute: 1.6 10*3/uL — ABNORMAL HIGH (ref 0.1–1.0)
NEUTROS ABS: 19.8 10*3/uL — AB (ref 1.7–7.7)
Neutrophils Relative %: 84 %
Platelets: 472 10*3/uL — ABNORMAL HIGH (ref 150–400)
RBC: 3.7 MIL/uL — ABNORMAL LOW (ref 3.87–5.11)
RDW: 15.3 % (ref 11.5–15.5)
WBC: 23.5 10*3/uL — ABNORMAL HIGH (ref 4.0–10.5)

## 2016-11-21 LAB — PROTIME-INR
INR: 2.1
Prothrombin Time: 23.9 seconds — ABNORMAL HIGH (ref 11.4–15.2)

## 2016-11-21 LAB — EPSTEIN-BARR VIRUS VCA, IGG: EBV VCA IGG: 181 U/mL — AB (ref 0.0–17.9)

## 2016-11-21 LAB — HEPATITIS C ANTIBODY: HCV Ab: <0.1 s/co ratio (ref 0.0–0.9)

## 2016-11-21 LAB — EPSTEIN-BARR VIRUS VCA, IGM: EBV VCA IgM: <36 U/mL (ref 0.0–35.9)

## 2016-11-21 LAB — CMV IGM: CMV IgM: <30 AU/mL (ref 0.0–29.9)

## 2016-11-21 MED ORDER — ZOLPIDEM TARTRATE 5 MG PO TABS: 5.0000 mg | ORAL_TABLET | Freq: Every evening | ORAL | 0 refills | Status: DC | PRN

## 2016-11-21 MED ORDER — WARFARIN SODIUM 1 MG PO TABS: 0.5000 mg | ORAL_TABLET | Freq: Once | ORAL | Status: DC

## 2016-11-21 MED ORDER — PREDNISONE 5 MG PO TABS: ORAL_TABLET | ORAL | 0 refills | Status: AC

## 2016-11-21 MED ORDER — RISAQUAD PO CAPS: 1.0000 | ORAL_CAPSULE | Freq: Every day | ORAL | 0 refills | Status: DC

## 2016-11-21 MED ORDER — PRO-STAT SUGAR FREE PO LIQD: 30.0000 mL | Freq: Two times a day (BID) | ORAL | 0 refills | Status: AC

## 2016-11-21 MED ORDER — SACCHAROMYCES BOULARDII 250 MG PO CAPS: 250.0000 mg | ORAL_CAPSULE | Freq: Two times a day (BID) | ORAL | 0 refills | Status: AC

## 2016-11-21 MED ORDER — MAGIC MOUTHWASH W/LIDOCAINE: 10.0000 mL | Freq: Four times a day (QID) | ORAL | 0 refills | Status: AC

## 2016-11-21 MED ORDER — VALACYCLOVIR HCL 500 MG PO TABS: 500.0000 mg | ORAL_TABLET | Freq: Two times a day (BID) | ORAL | 0 refills | Status: AC

## 2016-11-21 MED ORDER — FOLIC ACID 1 MG PO TABS: 1.0000 mg | ORAL_TABLET | Freq: Every day | ORAL | 0 refills | Status: AC

## 2016-11-21 MED ORDER — LOPERAMIDE HCL 2 MG PO CAPS: 2.0000 mg | ORAL_CAPSULE | ORAL | 0 refills | Status: AC | PRN

## 2016-11-21 NOTE — Progress Notes (Signed)
[place] for Coumadin Indication: h/o DVT  Allergies  Allergen Reactions  . Other Other (See Comments)    Patient has an intolerance to Augmentin,Zithromax,Bioxin,Avalox, & Erythromycin. She can not take them unless they are needed for Bronchiectasis  . Codeine Other (See Comments)    Unknown reaction    Patient Measurements: Height: 5' (152.4 cm) Weight: 126 lb 3.2 oz (57.2 kg) IBW/kg (Calculated) : 45.5  Vital Signs: Temp: 97.4 F (36.3 C) (03/17 0420) Temp Source: Oral (03/17 0420) BP: 102/51 (03/17 0420) Pulse Rate: 80 (03/17 0420)  Labs:  Recent Labs  11/19/16 0231  11/19/16 0612 11/20/16 0516 11/21/16 0422  HGB  --   < > 10.8* 11.3* 10.0*  HCT  --   --  32.5* 35.0* 30.8*  PLT  --   --  549* 538* 472*  LABPROT 26.1*  --   --  23.1* 23.9*  INR 2.34  --   --  2.01 2.10  < > = values in this interval not displayed.  Estimated Creatinine Clearance: 39.3 mL/min (by C-G formula based on SCr of 0.86 mg/dL).   Medical History: Past Medical History:  Diagnosis Date  . Aortic regurgitation 12/24/2013   moderate  . Bronchiectasis ([place])   . Bursitis of right shoulder   . DVT (deep venous thrombosis) ([place])   . 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 Mitral regurgitation 12/24/2013   mild    Assessment: 83yo female continuing Coumadin as PTA for h/o DVT. PMH of Factor C deficiency followed by Heme. Dopplers negative for new DVT. INR above goal at 3.13 on admit. Held doses x 2 days and resumed on 3/15 at low-dose. INR now back up a bit from 2.01>2.1 - slightly above goal set by patient's hematologist in [place] (INR 1.5-2.0). Hg trend down 10, plt stable 472, no bleed documented.  Home warfarin dose: 2mg  on MWF, 1mg  AOD (last dose 3/12 PTA)  Goal of Therapy:  INR 1.5-2.0 per patient/husband, per her hematologist in [place] Monitor platelets by anticoagulation protocol:  yes   Plan:  Warfarin  0.5mg  x 1 dose tonight - smaller dose with INR slightly above lower goal Daily INR Monitor CBC, s/sx bleeding   Elicia Lamp, PharmD, BCPS Clinical Pharmacist 11/21/2016 11:01 AM

## 2016-11-21 NOTE — [place] FL2 (Signed)
[place] MEDICAID FL2 LEVEL OF CARE SCREENING TOOL     IDENTIFICATION  Patient Name: Vanessa Page Birthdate: 04/04/1931 Sex: female Admission Date (Current Location): 11/16/2016  [facility] and [place] Number:  Herbalist and Address:  [facility], [place] [address] [place], [place] [address]      Provider Number: [id number]  Attending Physician Name and Address:  Lavina Hamman, MD  Relative Name and Phone Number:       Current Level of Care: Hospital Recommended Level of Care: [place] Prior Approval Number:    Date Approved/Denied:   PASRR Number: [id number]  Discharge Plan: SNF    Current Diagnoses: Patient Active Problem List   Diagnosis Date Noted  . SIRS (systemic inflammatory response syndrome) ([place]) 11/16/2016  . Murmur 11/16/2016  . Myalgia 11/01/2016  . Leukocytosis 11/01/2016  . UTI (urinary tract infection) 11/01/2016  . Hyponatremia 11/01/2016  . Weakness   . Hypotension, unspecified 01/08/2014  . Knee pain, left 01/08/2014  . Hip fracture requiring operative repair ([place]) 12/26/2013  . Leukocytosis, unspecified 12/25/2013  . Intertrochanteric fracture of left hip ([place]) 12/23/2013  . Hip fracture ([place]) 12/23/2013  . Chronic anticoagulation 12/23/2013  . History of DVT of lower extremity 12/23/2013  . Bronchiectasis ([place]) 12/23/2013  . Essential hypertension 12/23/2013    Orientation RESPIRATION BLADDER Height & Weight     Self, Time, Situation, Place  Normal Continent Weight: 126 lb 3.2 oz (57.2 kg) Height:  5' (152.4 cm)  BEHAVIORAL SYMPTOMS/MOOD NEUROLOGICAL BOWEL NUTRITION STATUS      Continent Diet (See DC Summary)  AMBULATORY STATUS COMMUNICATION OF NEEDS Skin   Limited Assist Verbally Normal                       Personal Care Assistance Level of Assistance  Bathing, Feeding, Dressing Bathing Assistance: Limited assistance Feeding assistance: Limited assistance Dressing  Assistance: Limited assistance     Functional Limitations Info  Sight, Hearing, Speech Sight Info: Adequate Hearing Info: Adequate Speech Info: Adequate    SPECIAL CARE FACTORS FREQUENCY  PT (By licensed PT), OT (By licensed OT)     PT Frequency: 5x wk OT Frequency: 5x wk            Contractures Contractures Info: Not present    Additional Factors Info  Code Status, Allergies Code Status Info: DNR Allergies Info: Other, Codeine           Current Medications (11/21/2016):  This is the current hospital active medication list Current Facility-Administered Medications  Medication Dose Route Frequency Provider Last Rate Last Dose  . acetaminophen (TYLENOL) tablet 325-650 mg  325-650 mg Oral Q6H PRN Skeet Simmer, RPH   325 mg at 11/20/16 2110   Or  . acetaminophen (TYLENOL) suppository 650 mg  650 mg Rectal Q6H PRN Skeet Simmer, RPH      . acidophilus (RISAQUAD) capsule 1 capsule  1 capsule Oral Daily Lavina Hamman, MD   1 capsule at 11/21/16 1039  . B-complex with vitamin C tablet 1 tablet  1 tablet Oral Daily Lavina Hamman, MD   1 tablet at 11/21/16 1039  . calcium carbonate (OS-CAL - dosed in mg of elemental calcium) tablet 500 mg of elemental calcium  1 tablet Oral Q breakfast Skeet Simmer, RPH   500 mg of elemental calcium at 11/21/16 0831  . feeding supplement (ENSURE ENLIVE) (ENSURE ENLIVE) liquid 237 mL  237 mL  Oral BID BM Eugenie Filler, MD   237 mL at 11/21/16 1000  . feeding supplement (PRO-STAT SUGAR FREE 64) liquid 30 mL  30 mL Oral BID Lavina Hamman, MD   30 mL at 11/21/16 1000  . fluticasone (FLONASE) 50 MCG/ACT nasal spray 2 spray  2 spray Each Nare Daily Eugenie Filler, MD   2 spray at 11/21/16 1041  . folic acid (FOLVITE) tablet 1 mg  1 mg Oral Daily Lavina Hamman, MD   1 mg at 11/21/16 1040  . HYDROcodone-acetaminophen (NORCO/VICODIN) 5-325 MG per tablet 1-2 tablet  1-2 tablet Oral Q4H PRN Toy Baker, MD      . iopamidol (ISOVUE-300) 61  % injection 100 mL  100 mL Intravenous Once PRN Toy Baker, MD      . loperamide (IMODIUM) capsule 2 mg  2 mg Oral PRN Lavina Hamman, MD   2 mg at 11/20/16 2110  . magic mouthwash w/lidocaine  10 mL Oral QID Lavina Hamman, MD   10 mL at 11/21/16 1040  . ondansetron (ZOFRAN) tablet 4 mg  4 mg Oral Q6H PRN Toy Baker, MD       Or  . ondansetron (ZOFRAN) injection 4 mg  4 mg Intravenous Q6H PRN Toy Baker, MD      . polyethylene glycol (MIRALAX / GLYCOLAX) packet 17 g  17 g Oral Daily PRN Toy Baker, MD   17 g at 11/17/16 1352  . predniSONE (DELTASONE) tablet 20 mg  20 mg Oral Q breakfast Lavina Hamman, MD   20 mg at 11/21/16 0831  . saccharomyces boulardii (FLORASTOR) capsule 250 mg  250 mg Oral BID Lavina Hamman, MD   250 mg at 11/21/16 1039  . sodium chloride flush (NS) 0.9 % injection 3 mL  3 mL Intravenous Q12H Toy Baker, MD   3 mL at 11/21/16 1000  . valACYclovir (VALTREX) tablet 500 mg  500 mg Oral BID Lavina Hamman, MD   500 mg at 11/21/16 1039  . warfarin (COUMADIN) tablet 0.5 mg  0.5 mg Oral ONCE-1800 Romona Curls, [facility]      . Warfarin - Pharmacist Dosing Inpatient   Does not apply [place], [facility]         Discharge Medications: Please see discharge summary for a list of discharge medications.  Relevant Imaging Results:  Relevant Lab Results:   Additional Information [id number]  Linton Flemings B, LCSWA

## 2016-11-21 NOTE — Clinical Social Work Placement (Signed)
   CLINICAL SOCIAL WORK PLACEMENT  NOTE  Date:  11/21/2016  Patient Details  Name: Vanessa Page MRN: [id number] Date of Birth: [birth year]  Clinical Social Work is seeking post-discharge placement for this patient at the [place] level of care (*CSW will initial, date and re-position this form in  chart as items are completed):  Yes   Patient/family provided with [place] Work Department's list of facilities offering this level of care within the geographic area requested by the patient (or if unable, by the patient's family).  Yes   Patient/family informed of their freedom to choose among providers that offer the needed level of care, that participate in Medicare, Medicaid or managed care program needed by the patient, have an available bed and are willing to accept the patient.  Yes   Patient/family informed of [place]'s ownership interest in [facility] and [facility], as well as of the fact that they are under no obligation to receive care at these facilities.  PASRR submitted to EDS on       PASRR number received on       Existing PASRR number confirmed on 11/21/16     FL2 transmitted to all facilities in geographic area requested by pt/family on 11/21/16     FL2 transmitted to all facilities within larger geographic area on       Patient informed that his/her managed care company has contracts with or will negotiate with certain facilities, including the following:        Yes   Patient/family informed of bed offers received.  Patient chooses bed at  [facility])     Physician recommends and patient chooses bed at      Patient to be transferred to  [facility]) on 11/21/16.  Patient to be transferred to facility by       Patient family notified on 11/21/16 of transfer.  Name of family member notified:  Family @ Bedside     PHYSICIAN Please sign FL2, Please sign DNR     Additional Comment:     _______________________________________________ Serafina Mitchell, LCSWA 11/21/2016, 12:17 PM

## 2016-11-21 NOTE — Discharge Summary (Addendum)
[facility] Discharge Summary   Patient: Vanessa Page BOF:[id number]   PCP: Pcp Not In System DOB: [birth year]   Date of admission: 11/16/2016   Date of discharge:  11/21/2016    Discharge Diagnoses:  Principal Problem:   SIRS (systemic inflammatory response syndrome) (HCC) Active Problems:   Chronic anticoagulation   History of DVT of lower extremity   Bronchiectasis ([place])   Essential hypertension   Leukocytosis   Hyponatremia   Murmur   Admitted From: home Disposition:  SNF  Recommendations for Outpatient Follow-up:  1. Please follow up with PCP in 1week and establish care with rheumatology in 2 weeks   Follow-up Information    RHEUMATOLOGY. Schedule an appointment as soon as possible for a visit in 2 week(s).   Why:  [facility]       PCP. Schedule an appointment as soon as possible for a visit in 1 week(s).          Diet recommendation: cardiac diet  Activity: The patient is advised to gradually reintroduce usual activities.  Discharge Condition: good  Code Status: full code  History of present illness: As per the H and P dictated on admission, "Vanessa Page is a 83 y.o. female with medical history significant of bronchiectasis factor C def, history of DVT lower extremities,  hyponatremia    Presented with diffuse weakness fevers after 101.3 patient was noted to tachycardia Dr. 110 she endorses lower extremity edema bilaterally on room air oxygen saturation was 90% on the mass arrival and she was started on 3 L. Since her admission she has been gradually getting weaker. Despite home PT never got better. States the weakness is worse in lower extremities. But arms have improved. She has had bilateral leg edema. Denies any sick contacts. NO diarrhea. She has been more sleepy recently.  States the decreased exercise tolerance started 6 weeks ago after she had Colonoscopy (showed only polyp) since then have had dyspnea with activity and  progressive weakness.  Patient was recently admitted for UTI associated diffuse weakness she was prior to this on Bactrim and thought this was secondary to antibiotics given myalgias felt to be serum sickness-like presentation neurology was consulted and felt that she has no ongoing GBS, stating he likes symptoms she was discharged home with PT OT home health. TI was treated with IV ceftriaxone she was discharged on oral Keflex Patient was discharged home 2/27 of February  Of note patient has known history of venous stasis ulcer on the right lower extremity that been healing well. Otherwise she hasn't had any nausea or vomiting no diarrhea no abdominal pain no cough no shortness of breath also throat no chest pain"  Hospital Course:   Summary of her active problems in the hospital is as following. 1 systemic inflammatory response syndrome Unclear etiology. Patient presented with fever, tachycardia, leukocytosis, elevated lactic acid level that has trended down. CT abdomen and pelvis negative for any source of infection. Chest x-ray as well as x-ray abdomen unremarkable. Patient has been pancultured, no growth.  appreciate input from infectious disease, no worsening after stopping Antibiotics.   2 PMR Pt with elevated ESR and proximal muscle weakness, having difficulty combing her hair. Started on prednisone 20 mg and feels better, suggest this as diagnosis,  Will taper slowly, recommend to establish care with rheumatology.   3 leukocytosis Patient with a persistent leukocytosis during this hospitalization, prior CBC were showing normal WBCs when not in distress. Patient is not toxic appearing.  elevated sedimentation rate of 70. Patient noted to have albumin level of 1.6.  hematology consulted, feels her leukocytosis is reactive and currently recommends no further workup.  4 hyponatremia Resolved.  5 history of Protein C deficiency/lower extremity DVT Patient on chronic  anticoagulation. INR supratherapeutic. Coumadin per pharmacy.  6 lower extremity edema Lower extremity Dopplers negative for DVT. 2-D echo with EF of 65-70%, no wall motion abnormalities, grade 2 diastolic dysfunction. Patient also noted to have hypoalbuminemia. Patient with a normal renal function. LFTs essentially within normal limits. CT abdomen and pelvis negative for any acute abnormalities.  7. Myalgia  Patient does have elevation of aldolase with normal CK. Also primary complaint is generalized muscle ache primarily located in lower extremities. At this point for myositis seems to be unremarkable. We'll start her on low-dose prednisone 20 mg daily. Supportive management. Discussed with neurology, for this patient does not have enough evidence of myositis at present, recommend no further workup  8.Gingivitis. We'll provide oral vitamins and monitor. Continue Magic mouthwash.  9. Diarrhea. Discuss with ID: No concern for C. difficile given no other associated symptoms. Start on Imodium when necessary.   10. Fever blister. No indication for isolation at present. Currently requested. On Valtrex for 7 days.  11. Insomnia  ambien PRN  All other chronic medical condition were stable during the hospitalization.  Patient was seen by physical therapy, who recommended SNF, which was arranged by Education officer, museum and case Freight forwarder. On the day of the discharge the patient's vitals were stable, and no other acute medical condition were reported by patient. the patient was felt safe to be discharge at SNF with therapy.  Procedures and Results:  Echocardiogram    Consultations:  Hematology  Infectious disease  DISCHARGE MEDICATION: Current Discharge Medication List    START taking these medications   Details  acidophilus (RISAQUAD) CAPS capsule Take 1 capsule by mouth daily. Qty: 10 capsule, Refills: 0    Amino Acids-Protein Hydrolys (FEEDING SUPPLEMENT, PRO-STAT SUGAR FREE  64,) LIQD Take 30 mLs by mouth 2 (two) times daily. Qty: 900 mL, Refills: 0    folic acid (FOLVITE) 1 MG tablet Take 1 tablet (1 mg total) by mouth daily. Qty: 30 tablet, Refills: 0    loperamide (IMODIUM) 2 MG capsule Take 1 capsule (2 mg total) by mouth as needed for diarrhea or loose stools. Qty: 5 capsule, Refills: 0    magic mouthwash w/lidocaine SOLN Take 10 mLs by mouth 4 (four) times daily. Qty: 50 mL, Refills: 0    predniSONE (DELTASONE) 5 MG tablet Take '20mg'$  daily for 5days,Take '15mg'$  daily for 2 weeks,Take 12.'5mg'$  daily for 2 weeks until seen by rheumatology. Qty: 120 tablet, Refills: 0    saccharomyces boulardii (FLORASTOR) 250 MG capsule Take 1 capsule (250 mg total) by mouth 2 (two) times daily. Qty: 30 capsule, Refills: 0    valACYclovir (VALTREX) 500 MG tablet Take 1 tablet (500 mg total) by mouth 2 (two) times daily. Qty: 10 tablet, Refills: 0    zolpidem (AMBIEN) 5 MG tablet Take 1 tablet (5 mg total) by mouth at bedtime as needed for sleep. Qty: 5 tablet, Refills: 0      CONTINUE these medications which have NOT CHANGED   Details  azelastine (ASTELIN) 137 MCG/SPRAY nasal spray Place 2 sprays into both nostrils at bedtime as needed for allergies. Use in each nostril as directed    calcium carbonate (OSCAL) 1500 (600 Ca) MG TABS tablet Take 600 mg of elemental calcium  by mouth daily with breakfast.    conjugated estrogens (PREMARIN) vaginal cream Place 1 Applicatorful vaginally 2 (two) times a week.    denosumab (PROLIA) 60 MG/ML SOLN injection Inject 60 mg into the skin every 6 (six) months. Administer in upper arm, thigh, or abdomen    feeding supplement, ENSURE ENLIVE, (ENSURE ENLIVE) LIQD Take 237 mLs by mouth 2 (two) times daily between meals. Qty: 237 mL, Refills: 12    fluticasone (FLONASE) 50 MCG/ACT nasal spray Place into both nostrils daily.    polyethylene glycol (MIRALAX / GLYCOLAX) packet Take 17 g by mouth daily as needed. Qty: 14 each, Refills: 0     Vitamin D, Ergocalciferol, (DRISDOL) 50000 units CAPS capsule Take 50,000 Units by mouth every 30 (thirty) days.    warfarin (COUMADIN) 1 MG tablet Take 1-2 mg by mouth See admin instructions. Takes 70m on mon,wed,fri  Takes 12mall other days      STOP taking these medications     cefdinir (OMNICEF) 300 MG capsule        Allergies  Allergen Reactions  . Other Other (See Comments)    Patient has an intolerance to Augmentin,Zithromax,Bioxin,Avalox, & Erythromycin. She can not take them unless they are needed for Bronchiectasis  . Codeine Other (See Comments)    Unknown reaction   Discharge Instructions    Diet - low sodium heart healthy    Complete by:  As directed    Discharge instructions    Complete by:  As directed    It is important that you read following instructions as well as go over your medication list with RN to help you understand your care after this hospitalization.  Discharge Instructions: Please follow-up with PCP in one week  Please request your primary care physician to go over all Hospital Tests and Procedure/Radiological results at the follow up,  Please get all Hospital records sent to your PCP by signing hospital release before you go home.   Do not take more than prescribed Pain, Sleep and Anxiety Medications. You were cared for by a hospitalist during your hospital stay. If you have any questions about your discharge medications or the care you received while you were in the hospital after you are discharged, you can call the unit and ask to speak with the hospitalist on call if the hospitalist that took care of you is not available.  Once you are discharged, your primary care physician will handle any further medical issues. Please note that NO REFILLS for any discharge medications will be authorized once you are discharged, as it is imperative that you return to your primary care physician (or establish a relationship with a primary care physician if  you do not have one) for your aftercare needs so that they can reassess your need for medications and monitor your lab values. You Must read complete instructions/literature along with all the possible adverse reactions/side effects for all the Medicines you take and that have been prescribed to you. Take any new Medicines after you have completely understood and accept all the possible adverse reactions/side effects. Wear Seat belts while driving. If you have smoked or chewed Tobacco in the last 2 yrs please stop smoking and/or stop any Recreational drug use.   Increase activity slowly    Complete by:  As directed      Discharge Exam: Filed Weights   11/16/16 1817 11/17/16 0031  Weight: 56.2 kg (124 lb) 57.2 kg (126 lb 3.2 oz)   Vitals:  11/20/16 1941 11/21/16 0420  BP: (!) 121/57 (!) 102/51  Pulse: 79 80  Resp:    Temp: 97.7 F (36.5 C) 97.4 F (36.3 C)   General: Appear in no distress, no Rash; Oral Mucosa moist. Cardiovascular: S1 and S2 Present, no Murmur, no JVD Respiratory: Bilateral Air entry present and Clear to Auscultation, no Crackles, n wheezes Abdomen: Bowel Sound opresent, Soft and no tenderness Extremities: trace Pedal edema, no calf tenderness Neurology: Grossly no focal neuro deficit.  The results of significant diagnostics from this hospitalization (including imaging, microbiology, ancillary and laboratory) are listed below for reference.    Significant Diagnostic Studies: Dg Chest 2 View  Result Date: 11/16/2016 CLINICAL DATA:  83 year old female with history of shortness of breath for the past 2 weeks, worsening on exertion. History bronchiectasis. EXAM: CHEST  2 VIEW COMPARISON:  Chest x-ray 11/01/2016. FINDINGS: No consolidative airspace disease. Small left pleural effusion blunting the left costophrenic sulcus. No right pleural effusion. No evidence of pulmonary edema. No definite suspicious appearing pulmonary nodules or masses. Heart size is normal. The  patient is rotated to the left on today's exam, resulting in distortion of the mediastinal contours and reduced diagnostic sensitivity and specificity for mediastinal pathology. Atherosclerosis in the thoracic aorta. Bilateral apical pleuroparenchymal thickening, partially calcified, most compatible with chronic post infectious or inflammatory scarring. IMPRESSION: 1. Small left pleural effusion. 2. Aortic atherosclerosis. Electronically Signed   By: Vinnie Langton M.D.   On: 11/16/2016 20:21   Dg Chest 2 View  Result Date: 11/01/2016 CLINICAL DATA:  Generalized body aches. EXAM: CHEST  2 VIEW COMPARISON:  Chest x-ray 01/09/2014 FINDINGS: The heart size is upper limits of normal. Atherosclerotic changes are present at the aortic arch. Changes COPD are again seen. There is no edema or effusion. No focal airspace disease is present. IMPRESSION: 1. No acute cardiopulmonary disease. 2. Aortic atherosclerosis. 3. Changes of COPD. Electronically Signed   By: San Morelle M.D.   On: 11/01/2016 09:07   Dg Abd 1 View  Result Date: 11/17/2016 CLINICAL DATA:  Constipation. EXAM: ABDOMEN - 1 VIEW COMPARISON:  CT 11/17/2016 FINDINGS: Oral contrast material noted in the right colon and transverse colon. Contrast also noted within the urinary bladder from prior IV contrast administration. No evidence of bowel obstruction or free air. No organomegaly. No acute bony abnormality appear IMPRESSION: No acute findings. Electronically Signed   By: Rolm Baptise M.D.   On: 11/17/2016 07:59   Mr Brain Wo Contrast  Result Date: 11/01/2016 CLINICAL DATA:  Weakness EXAM: MRI HEAD WITHOUT CONTRAST TECHNIQUE: Multiplanar, multiecho pulse sequences of the brain and surrounding structures were obtained without intravenous contrast. COMPARISON:  None. FINDINGS: Brain: Moderate atrophy. Ventricular enlargement consistent with atrophy. Negative for acute infarct. Hyperintensity in the cerebral white matter bilaterally.  Hyperintensity in the left thalamus. These areas are most likely due to chronic ischemia. Negative for hemorrhage or mass. No shift of the midline structures. Vascular: Normal arterial flow voids. Skull and upper cervical spine: Negative Sinuses/Orbits: Paranasal sinuses clear. Bilateral mastoid effusion. Normal orbit. Other: None IMPRESSION: Atrophy and moderate chronic ischemic change.  No acute abnormality Bilateral mastoid sinus effusion. Electronically Signed   By: Franchot Gallo M.D.   On: 11/01/2016 18:48   Ct Abdomen Pelvis W Contrast  Result Date: 11/17/2016 CLINICAL DATA:  83 year old hypertensive female with systemic inflammatory response. History deep venous thrombosis. Recent urinary tract infection. Leukocytosis. Fever. Initial encounter. EXAM: CT ABDOMEN AND PELVIS WITH CONTRAST TECHNIQUE: Multidetector CT imaging of  the abdomen and pelvis was performed using the standard protocol following bolus administration of intravenous contrast. CONTRAST:  100 cc Isovue 300. COMPARISON:  No comparison CT. FINDINGS: Lower chest: Bronchiectasis lower right middle lobe. Basilar subsegmental atelectasis. Trace left pleural effusion/ pleural thickening. Heart slightly enlarged.  Mitral valve calcifications. Hepatobiliary: 2.1 cm cyst dome of liver. Posterior dome liver 1.7 cm hemangioma suspected. No calcified gallstone. Pancreas: No pancreatic mass. Spleen: No mass or enlargement. Adrenals/Urinary Tract: Mild hyperplasia adrenal glands. No renal or ureteral obstructing stone or hydronephrosis. Small cysts right lower pole. Noncontrast filled imaging of the urinary bladder without gross abnormality. Stomach/Bowel: Small hiatal hernia. Under distended stomach without gross abnormality. Minimal haziness of fat planes suggestive of third spacing of fluid giving subcutaneous appearance rather than related to primary bowel inflammatory process. No free air or drainable abscess. Moderate stool rectosigmoid region.  Vascular/Lymphatic: Atherosclerotic changes aorta with ectasia without aneurysmal dilation. Atherosclerotic changes iliac arteries with slight ectasia and narrowing. No obvious thrombosis of the iliac veins. No adenopathy. Reproductive: Endometrial lining measures 3 mm. If the patient had any vaginal bleeding than this can be assessed with pelvic sonogram. Tiny left ovarian cysts. Other: Mild third spacing of fluid. Musculoskeletal: Post left intertrochanteric fixation with streak artifact. Scoliosis lumbar spine convex left with disc space narrowing greatest on the right at the L2-3 level and on the left at the L4-5 level. IMPRESSION: Mild third spacing of fluid without source of patient's fever identified on the current exam. Right middle lobe bronchiectasis. Subsegmental basilar atelectasis with trace left pleural effusion/pleural thickening. Small hemangioma posterior dome of the liver suspected. Aortic atherosclerosis. Endometrial lining thickness 3 mm and tiny left ovarian cyst possibly incidental findings. If there is any vaginal bleeding, pelvic sonogram may then be considered for further delineation. Scoliosis and degenerative changes. Electronically Signed   By: Genia Del M.D.   On: 11/17/2016 06:38   [place] Lt Upper Extrem Ltd Soft Tissue Non Vascular  Result Date: 11/20/2016 CLINICAL DATA:  Patient admitted 11/16/2016 with fever and tachycardia. Possible myositis. Pain and swelling about the left elbow for 3 days. EXAM: ULTRASOUND LEFT UPPER EXTREMITY LIMITED TECHNIQUE: Ultrasound examination of the upper extremity soft tissues was performed in the area of clinical concern. COMPARISON:  NONE. FINDINGS: Scanning was directed toward the region of concern. There is subcutaneous edema. No focal fluid collection is seen. No mass is identified. IMPRESSION: Subcutaneous edema without focal fluid collection. Electronically Signed   By: Inge Rise M.D.   On: 11/20/2016 08:40    Microbiology: Recent  Results (from the past 240 hour(s))  Blood Culture (routine x 2)     Status: None (Preliminary result)   Collection Time: 11/16/16  6:25 PM  Result Value Ref Range Status   Specimen Description BLOOD RIGHT HAND  Final   Special Requests BOTTLES DRAWN AEROBIC ONLY 5CC  Final   Culture NO GROWTH 4 DAYS  Final   Report Status PENDING  Incomplete  Blood Culture (routine x 2)     Status: None (Preliminary result)   Collection Time: 11/16/16  6:55 PM  Result Value Ref Range Status   Specimen Description BLOOD LEFT HAND  Final   Special Requests BOTTLES DRAWN AEROBIC ONLY 10CC  Final   Culture NO GROWTH 4 DAYS  Final   Report Status PENDING  Incomplete  Gram stain     Status: None   Collection Time: 11/16/16  7:39 PM  Result Value Ref Range Status   Specimen Description URINE,  CATHETERIZED  Final   Special Requests NONE  Final   Gram Stain   Final    SQUAMOUS EPITHELIAL CELLS PRESENT WBC PRESENT, PREDOMINANTLY PMN GRAM POSITIVE RODS GRAM POSITIVE COCCI IN PAIRS CYTOSPIN SMEAR    Report Status 11/16/2016 FINAL  Final  Urine culture     Status: None   Collection Time: 11/16/16  7:39 PM  Result Value Ref Range Status   Specimen Description URINE, CATHETERIZED  Final   Special Requests NONE  Final   Culture NO GROWTH  Final   Report Status 11/17/2016 FINAL  Final  Respiratory Panel by PCR     Status: None   Collection Time: 11/16/16  9:55 PM  Result Value Ref Range Status   Adenovirus NOT DETECTED NOT DETECTED Final   Coronavirus 229E NOT DETECTED NOT DETECTED Final   Coronavirus HKU1 NOT DETECTED NOT DETECTED Final   Coronavirus NL63 NOT DETECTED NOT DETECTED Final   Coronavirus OC43 NOT DETECTED NOT DETECTED Final   Metapneumovirus NOT DETECTED NOT DETECTED Final   Rhinovirus / Enterovirus NOT DETECTED NOT DETECTED Final   Influenza A NOT DETECTED NOT DETECTED Final   Influenza B NOT DETECTED NOT DETECTED Final   Parainfluenza Virus 1 NOT DETECTED NOT DETECTED Final    Parainfluenza Virus 2 NOT DETECTED NOT DETECTED Final   Parainfluenza Virus 3 NOT DETECTED NOT DETECTED Final   Parainfluenza Virus 4 NOT DETECTED NOT DETECTED Final   Respiratory Syncytial Virus NOT DETECTED NOT DETECTED Final   Bordetella pertussis NOT DETECTED NOT DETECTED Final   Chlamydophila pneumoniae NOT DETECTED NOT DETECTED Final   Mycoplasma pneumoniae NOT DETECTED NOT DETECTED Final     Labs: CBC:  Recent Labs Lab 11/17/16 0449 11/18/16 0425 11/19/16 0612 11/20/16 0516 11/21/16 0422  WBC 26.9* 24.6* 26.0* 27.6* 23.5*  NEUTROABS 22.3* 19.8* 21.3* 22.4* 19.8*  HGB 10.2* 10.3* 10.8* 11.3* 10.0*  HCT 30.6* 31.5* 32.5* 35.0* 30.8*  MCV 81.6 82.0 81.5 83.1 83.2  PLT 474* 490* 549* 538* 938*   Basic Metabolic Panel:  Recent Labs Lab 11/16/16 1820 11/17/16 0450 11/18/16 0425  NA 132* 132* 133*  K 4.1 3.2* 3.4*  CL 93* 99* 98*  CO2 _0 GLUCOSE 133* 117* 118*  BUN _1 CREATININE 0.95 0.80 0.86  CALCIUM 8.3* 7.1* 7.5*  MG  --  1.7  --   PHOS  --  3.0  --    Liver Function Tests:  Recent Labs Lab 11/16/16 1820 11/17/16 0450 11/18/16 0425  AST 36 44* 48*  ALT 26 27 32  ALKPHOS 120 118 120  BILITOT 0.6 0.8 0.8  PROT 6.1* 4.9* 5.0*  ALBUMIN 2.2* 1.6* 1.6*   No results for input(s): LIPASE, AMYLASE in the last 168 hours. No results for input(s): AMMONIA in the last 168 hours. Cardiac Enzymes:  Recent Labs Lab 11/16/16 2341 11/17/16 0450 11/17/16 1133  CKTOTAL 20*  --   --   TROPONINI 0.06* 0.03* 0.04*   BNP (last 3 results)  Recent Labs  11/16/16 2153  BNP 196.7*   CBG: No results for input(s): GLUCAP in the last 168 hours. Time spent: 35 minutes  Signed:  Bayron Dalto  [facility]  11/21/2016  , 11:09 AM

## 2016-11-21 NOTE — Progress Notes (Signed)
Discharged to home with family office visits in place teaching done  

## 2016-11-21 NOTE — Progress Notes (Signed)
[facility] Infectious Disease   Reason for visit: Follow up on leukocytosis  Interval History: more walking, more movement, feels better, afebrile, WBC lower despite prednisone  Physical Exam: Constitutional:  Vitals:   11/20/16 1941 11/21/16 0420  BP: (!) 121/57 (!) 102/51  Pulse: 79 80  Resp:    Temp: 97.7 F (36.5 C) 97.4 F (36.3 C)   patient appears in NAD, in chair Respiratory: Normal respiratory effort; CTA B Cardiovascular: RRR GI: soft, nt, nd  Review of Systems: Constitutional: negative for fevers and chills Gastrointestinal: negative for diarrhea Musculoskeletal: negative for arthralgias  Lab Results  Component Value Date   WBC 23.5 (H) 11/21/2016   HGB 10.0 (L) 11/21/2016   HCT 30.8 (L) 11/21/2016   MCV 83.2 11/21/2016   PLT 472 (H) 11/21/2016    Lab Results  Component Value Date   CREATININE 0.86 11/18/2016   BUN 11 11/18/2016   NA 133 (L) 11/18/2016   K 3.4 (L) 11/18/2016   CL 98 (L) 11/18/2016   CO2 26 11/18/2016    Lab Results  Component Value Date   ALT 32 11/18/2016   AST 48 (H) 11/18/2016   ALKPHOS 120 11/18/2016     Microbiology: Recent Results (from the past 240 hour(s))  Blood Culture (routine x 2)     Status: None (Preliminary result)   Collection Time: 11/16/16  6:25 PM  Result Value Ref Range Status   Specimen Description BLOOD RIGHT HAND  Final   Special Requests BOTTLES DRAWN AEROBIC ONLY 5CC  Final   Culture NO GROWTH 4 DAYS  Final   Report Status PENDING  Incomplete  Blood Culture (routine x 2)     Status: None (Preliminary result)   Collection Time: 11/16/16  6:55 PM  Result Value Ref Range Status   Specimen Description BLOOD LEFT HAND  Final   Special Requests BOTTLES DRAWN AEROBIC ONLY 10CC  Final   Culture NO GROWTH 4 DAYS  Final   Report Status PENDING  Incomplete  Gram stain     Status: None   Collection Time: 11/16/16  7:39 PM  Result Value Ref Range Status   Specimen Description URINE, CATHETERIZED   Final   Special Requests NONE  Final   Gram Stain   Final    SQUAMOUS EPITHELIAL CELLS PRESENT WBC PRESENT, PREDOMINANTLY PMN GRAM POSITIVE RODS GRAM POSITIVE COCCI IN PAIRS CYTOSPIN SMEAR    Report Status 11/16/2016 FINAL  Final  Urine culture     Status: None   Collection Time: 11/16/16  7:39 PM  Result Value Ref Range Status   Specimen Description URINE, CATHETERIZED  Final   Special Requests NONE  Final   Culture NO GROWTH  Final   Report Status 11/17/2016 FINAL  Final  Respiratory Panel by PCR     Status: None   Collection Time: 11/16/16  9:55 PM  Result Value Ref Range Status   Adenovirus NOT DETECTED NOT DETECTED Final   Coronavirus 229E NOT DETECTED NOT DETECTED Final   Coronavirus HKU1 NOT DETECTED NOT DETECTED Final   Coronavirus NL63 NOT DETECTED NOT DETECTED Final   Coronavirus OC43 NOT DETECTED NOT DETECTED Final   Metapneumovirus NOT DETECTED NOT DETECTED Final   Rhinovirus / Enterovirus NOT DETECTED NOT DETECTED Final   Influenza A NOT DETECTED NOT DETECTED Final   Influenza B NOT DETECTED NOT DETECTED Final   Parainfluenza Virus 1 NOT DETECTED NOT DETECTED Final   Parainfluenza Virus 2 NOT DETECTED NOT DETECTED  Final   Parainfluenza Virus 3 NOT DETECTED NOT DETECTED Final   Parainfluenza Virus 4 NOT DETECTED NOT DETECTED Final   Respiratory Syncytial Virus NOT DETECTED NOT DETECTED Final   Bordetella pertussis NOT DETECTED NOT DETECTED Final   Chlamydophila pneumoniae NOT DETECTED NOT DETECTED Final   Mycoplasma pneumoniae NOT DETECTED NOT DETECTED Final    Impression/Plan:  1. Possible Polymyalgia rheumatica - seems to have improved with steroids which suggests this is the diagnosis.   Will need to follow up with rheumatology at some point.  2. Weakness - from # 1 and may need rehab.    I will sign off, thanks for consultation

## 2016-11-21 NOTE — Clinical Social Work Note (Signed)
Clinical Social Worker facilitated patient discharge including contacting patient family and facility to confirm patient discharge plans.  Clinical information faxed to facility and family agreeable with plan.  CSW arranged ambulance transport via [facility] to [facility] RN to call report [phone] prior to discharge.  Clinical Social Worker will sign off for now as social work intervention is no longer needed. Please consult [place] again if new need arises.  Lonette Stevison B. Joline Maxcy Clinical Social Work Dept Weekend Social Worker [phone] 12:03 PM

## 2016-11-23 ENCOUNTER — Encounter: Payer: Self-pay | Admitting: Adult Health

## 2016-11-23 ENCOUNTER — Non-Acute Institutional Stay (SKILLED_NURSING_FACILITY): Payer: Medicare Other | Admitting: Adult Health

## 2016-11-23 DIAGNOSIS — M353 Polymyalgia rheumatica: Secondary | ICD-10-CM

## 2016-11-23 DIAGNOSIS — R531 Weakness: Secondary | ICD-10-CM

## 2016-11-23 DIAGNOSIS — Z86718 Personal history of other venous thrombosis and embolism: Secondary | ICD-10-CM

## 2016-11-23 DIAGNOSIS — B001 Herpesviral vesicular dermatitis: Secondary | ICD-10-CM

## 2016-11-23 DIAGNOSIS — Z7901 Long term (current) use of anticoagulants: Secondary | ICD-10-CM

## 2016-11-23 DIAGNOSIS — R6510 Systemic inflammatory response syndrome (SIRS) of non-infectious origin without acute organ dysfunction: Secondary | ICD-10-CM | POA: Diagnosis not present

## 2016-11-23 DIAGNOSIS — D72829 Elevated white blood cell count, unspecified: Secondary | ICD-10-CM

## 2016-11-23 DIAGNOSIS — J309 Allergic rhinitis, unspecified: Secondary | ICD-10-CM

## 2016-11-23 DIAGNOSIS — K0510 Chronic gingivitis, plaque induced: Secondary | ICD-10-CM

## 2016-11-23 NOTE — Progress Notes (Signed)
DATE:  11/23/2016   MRN:  [id number]  BIRTHDAY: [birth year]  Facility:  [facility]ursing Home Location:  [place] Room Number: 803-B  LEVEL OF CARE:  SNF (42)  Contact Information    Name Relation Home Work Mobile   [place] Spouse [phone]         Code Status History    Date Active Date Inactive Code Status Order ID Comments User Context   11/21/2016  2:01 PM 11/21/2016  7:15 PM Full Code [id number]  Lavina Hamman, MD Inpatient   11/17/2016 12:31 AM 11/21/2016  2:01 PM DNR [id number]  Toy Baker, MD Inpatient   11/01/2016  3:44 PM 11/03/2016  8:09 PM Full Code [id number]  Jani Gravel, MD Inpatient   12/26/2013  4:05 PM 01/05/2014  2:42 PM Full Code [id number]  Bary Leriche, PA-C Inpatient   12/24/2013 10:22 AM 12/26/2013  4:05 PM Full Code [id number]  Mcarthur Rossetti, MD Inpatient   12/23/2013  9:06 PM 12/24/2013 10:22 AM Full Code [id number]  Nat Math, MD Inpatient       Chief Complaint  Patient presents with  . Hospitalization Follow-up    HISTORY OF PRESENT ILLNESS:   This is an 83-YO female admitted to [facility] for short-term rehabilitation on 11/21/2016 following an admission at [facility] 11/16/2016-11/21/2016 for systemic inflammatory response syndrome (SIRS). CT abdomen unremarkable and panculture showed no growth. She was empirically treated with IV Vancomycin and IV Zosyn. She had persistent leukocytosis and elevated ESR of 70. Hematology was consulted and thought that leukocytosis is reactive and no further work-up needed. Lower extremity dopplers were negative for DVT and 2-D echo with EF 65-70%. Neurology was consulted for her myalgia of lower extremity and thought that she does not have much evidence of myositis. She was started on Prednisone. She was given magic mouthwash for gingivitis. She was given Valtrex for fever blister. She has PMH of bronchiectasis, factor C def,  history of DVT lower extremities and  hyponatremia.  She was seen in the room today with husband @ bedside. She verbalized no concerns.   PAST MEDICAL HISTORY:  Past Medical History:  Diagnosis Date  . Aortic regurgitation 12/24/2013   moderate  . Bronchiectasis ([place])   . Bursitis of right shoulder   . Chronic anticoagulation   . DVT (deep venous thrombosis) ([place])    Lower extremity  . Essential hypertension   . 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 Hyponatremia   . Leukocytosis   . Mitral regurgitation 12/24/2013   mild  . Murmur   . SIRS (systemic inflammatory response syndrome) ([facility])      CURRENT MEDICATIONS: Reviewed  Patient's Medications  New Prescriptions   No medications on file  Previous Medications   ACIDOPHILUS (RISAQUAD) CAPS CAPSULE    Take 1 capsule by mouth daily.   AMINO ACIDS-PROTEIN HYDROLYS (FEEDING SUPPLEMENT, PRO-STAT SUGAR FREE 64,) LIQD    Take 30 mLs by mouth 2 (two) times daily.   AZELASTINE (ASTELIN) 137 MCG/SPRAY NASAL SPRAY    Place 2 sprays into both nostrils at bedtime as needed for allergies. Use in each nostril as directed   CALCIUM CARBONATE (TUMS EX) 750 MG CHEWABLE TABLET    Chew 1 tablet by mouth daily.   CONJUGATED ESTROGENS (PREMARIN) VAGINAL CREAM    Place 1 Applicatorful vaginally 2 (two) times a week.   FLUTICASONE (FLONASE) 50 MCG/ACT NASAL SPRAY  Place into both nostrils daily.   FOLIC ACID (FOLVITE) 1 MG TABLET    Take 1 tablet (1 mg total) by mouth daily.   LOPERAMIDE (IMODIUM) 2 MG CAPSULE    Take 1 capsule (2 mg total) by mouth as needed for diarrhea or loose stools.   MAGIC MOUTHWASH W/LIDOCAINE SOLN    Take 10 mLs by mouth 4 (four) times daily.   NUTRITIONAL SUPPLEMENT LIQD    Take 237 mLs by mouth 2 (two) times daily. [employer]   POLYETHYLENE GLYCOL (MIRALAX / GLYCOLAX) PACKET    Take 17 g by mouth daily as needed.   PREDNISONE (DELTASONE) 5 MG TABLET    Take 71m daily for 5days,Take 134mdaily for 2 weeks,Take 12.39m57mdaily for 2 weeks until seen by rheumatology.   SACCHAROMYCES BOULARDII (FLORASTOR) 250 MG CAPSULE    Take 1 capsule (250 mg total) by mouth 2 (two) times daily.   VALACYCLOVIR (VALTREX) 500 MG TABLET    Take 1 tablet (500 mg total) by mouth 2 (two) times daily.   VITAMIN D, ERGOCALCIFEROL, (DRISDOL) 50000 UNITS CAPS CAPSULE    Take 50,000 Units by mouth every 30 (thirty) days.   WARFARIN (COUMADIN) 1 MG TABLET    Take 1-2 mg by mouth See admin instructions. Takes 2mg23m mon,wed,fri  Takes 1mg 88m other days  Modified Medications   No medications on file  Discontinued Medications   CALCIUM CARBONATE (OSCAL) 1500 (600 CA) MG TABS TABLET    Take 600 mg of elemental calcium by mouth daily with breakfast.   DENOSUMAB (PROLIA) 60 MG/ML SOLN INJECTION    Inject 60 mg into the skin every 6 (six) months. Administer in upper arm, thigh, or abdomen   FEEDING SUPPLEMENT, ENSURE ENLIVE, (ENSURE ENLIVE) LIQD    Take 237 mLs by mouth 2 (two) times daily between meals.   ZOLPIDEM (AMBIEN) 5 MG TABLET    Take 1 tablet (5 mg total) by mouth at bedtime as needed for sleep.     Allergies  Allergen Reactions  . Other Other (See Comments)    Patient has an intolerance to Augmentin,Zithromax,Bioxin,Avalox, & Erythromycin. She can not take them unless they are needed for Bronchiectasis  . Codeine Other (See Comments)    Unknown reaction     REVIEW OF SYSTEMS:  GENERAL: no change in appetite, no fatigue, no weight changes, no fever, chills or weakness EYES: Denies change in vision, dry eyes, eye pain, itching or discharge EARS: Denies change in hearing, ringing in ears, or earache NOSE: Denies nasal congestion or epistaxis MOUTH and THROAT: Denies oral discomfort, gingival pain or bleeding, pain from teeth or hoarseness   RESPIRATORY: no cough, SOB, DOE, wheezing, hemoptysis CARDIAC: no chest pain, edema or palpitations GI: no abdominal pain, diarrhea, constipation, heart burn, nausea or vomiting GU:  Denies dysuria, frequency, hematuria, incontinence, or discharge PSYCHIATRIC: Denies feeling of depression or anxiety. No report of hallucinations, insomnia, paranoia, or agitation    PHYSICAL EXAMINATION  GENERAL APPEARANCE: Well nourished. In no acute distress. Normal body habitus SKIN:  Has dry blister on her chin HEAD: Normal in size and contour. No evidence of trauma EYES: Lids open and close normally. No blepharitis, entropion or ectropion. PERRL. Conjunctivae are clear and sclerae are white. Lenses are without opacity EARS: Pinnae are normal. Patient hears normal voice tunes of the examiner MOUTH and THROAT: Lips are without lesions. Oral mucosa is moist and without lesions. Tongue is normal in shape, size, and color and without  lesions NECK: supple, trachea midline, no neck masses, no thyroid tenderness, no thyromegaly LYMPHATICS: no LAN in the neck, no supraclavicular LAN RESPIRATORY: breathing is even & unlabored, BS CTAB CARDIAC: RRR, no murmur,no extra heart sounds, BLE 2+ edema GI: abdomen soft, normal BS, no masses, no tenderness, no hepatomegaly, no splenomegaly EXTREMITIES:  Able to move X 4 extremities PSYCHIATRIC: Alert and oriented X 3. Affect and behavior are appropriate  LABS/RADIOLOGY: Labs reviewed: Basic Metabolic Panel:  Recent Labs  11/16/16 1820 11/17/16 0450 11/18/16 0425  NA 132* 132* 133*  K 4.1 3.2* 3.4*  CL 93* 99* 98*  CO2 26 25 26   GLUCOSE 133* 117* 118*  BUN 18 12 11   CREATININE 0.95 0.80 0.86  CALCIUM 8.3* 7.1* 7.5*  MG  --  1.7  --   PHOS  --  3.0  --    Liver Function Tests:  Recent Labs  11/16/16 1820 11/17/16 0450 11/18/16 0425  AST 36 44* 48*  ALT 26 27 32  ALKPHOS 120 118 120  BILITOT 0.6 0.8 0.8  PROT 6.1* 4.9* 5.0*  ALBUMIN 2.2* 1.6* 1.6*    Recent Labs  11/01/16 0740  LIPASE 20   CBC:  Recent Labs  11/19/16 0612 11/20/16 0516 11/21/16 0422  WBC 26.0* 27.6* 23.5*  NEUTROABS 21.3* 22.4* 19.8*  HGB 10.8*  11.3* 10.0*  HCT 32.5* 35.0* 30.8*  MCV 81.5 83.1 83.2  PLT 549* 538* 472*   Cardiac Enzymes:  Recent Labs  11/01/16 0740 11/02/16 0237 11/16/16 2341 11/17/16 0450 11/17/16 1133  CKTOTAL 24* 31* 20*  --   --   CKMB  --  2.9  --   --   --   TROPONINI <0.03  --  0.06* 0.03* 0.04*    Dg Chest 2 View  Result Date: 11/16/2016 CLINICAL DATA:  83 year old female with history of shortness of breath for the past 2 weeks, worsening on exertion. History bronchiectasis. EXAM: CHEST  2 VIEW COMPARISON:  Chest x-ray 11/01/2016. FINDINGS: No consolidative airspace disease. Small left pleural effusion blunting the left costophrenic sulcus. No right pleural effusion. No evidence of pulmonary edema. No definite suspicious appearing pulmonary nodules or masses. Heart size is normal. The patient is rotated to the left on today's exam, resulting in distortion of the mediastinal contours and reduced diagnostic sensitivity and specificity for mediastinal pathology. Atherosclerosis in the thoracic aorta. Bilateral apical pleuroparenchymal thickening, partially calcified, most compatible with chronic post infectious or inflammatory scarring. IMPRESSION: 1. Small left pleural effusion. 2. Aortic atherosclerosis. Electronically Signed   By: Vinnie Langton M.D.   On: 11/16/2016 20:21   Dg Chest 2 View  Result Date: 11/01/2016 CLINICAL DATA:  Generalized body aches. EXAM: CHEST  2 VIEW COMPARISON:  Chest x-ray 01/09/2014 FINDINGS: The heart size is upper limits of normal. Atherosclerotic changes are present at the aortic arch. Changes COPD are again seen. There is no edema or effusion. No focal airspace disease is present. IMPRESSION: 1. No acute cardiopulmonary disease. 2. Aortic atherosclerosis. 3. Changes of COPD. Electronically Signed   By: San Morelle M.D.   On: 11/01/2016 09:07   Dg Abd 1 View  Result Date: 11/17/2016 CLINICAL DATA:  Constipation. EXAM: ABDOMEN - 1 VIEW COMPARISON:  CT 11/17/2016  FINDINGS: Oral contrast material noted in the right colon and transverse colon. Contrast also noted within the urinary bladder from prior IV contrast administration. No evidence of bowel obstruction or free air. No organomegaly. No acute bony abnormality appear IMPRESSION: No acute findings.  Electronically Signed   By: Rolm Baptise M.D.   On: 11/17/2016 07:59   Mr Brain Wo Contrast  Result Date: 11/01/2016 CLINICAL DATA:  Weakness EXAM: MRI HEAD WITHOUT CONTRAST TECHNIQUE: Multiplanar, multiecho pulse sequences of the brain and surrounding structures were obtained without intravenous contrast. COMPARISON:  None. FINDINGS: Brain: Moderate atrophy. Ventricular enlargement consistent with atrophy. Negative for acute infarct. Hyperintensity in the cerebral white matter bilaterally. Hyperintensity in the left thalamus. These areas are most likely due to chronic ischemia. Negative for hemorrhage or mass. No shift of the midline structures. Vascular: Normal arterial flow voids. Skull and upper cervical spine: Negative Sinuses/Orbits: Paranasal sinuses clear. Bilateral mastoid effusion. Normal orbit. Other: None IMPRESSION: Atrophy and moderate chronic ischemic change.  No acute abnormality Bilateral mastoid sinus effusion. Electronically Signed   By: Franchot Gallo M.D.   On: 11/01/2016 18:48   Ct Abdomen Pelvis W Contrast  Result Date: 11/17/2016 CLINICAL DATA:  83 year old hypertensive female with systemic inflammatory response. History deep venous thrombosis. Recent urinary tract infection. Leukocytosis. Fever. Initial encounter. EXAM: CT ABDOMEN AND PELVIS WITH CONTRAST TECHNIQUE: Multidetector CT imaging of the abdomen and pelvis was performed using the standard protocol following bolus administration of intravenous contrast. CONTRAST:  100 cc Isovue 300. COMPARISON:  No comparison CT. FINDINGS: Lower chest: Bronchiectasis lower right middle lobe. Basilar subsegmental atelectasis. Trace left pleural effusion/  pleural thickening. Heart slightly enlarged.  Mitral valve calcifications. Hepatobiliary: 2.1 cm cyst dome of liver. Posterior dome liver 1.7 cm hemangioma suspected. No calcified gallstone. Pancreas: No pancreatic mass. Spleen: No mass or enlargement. Adrenals/Urinary Tract: Mild hyperplasia adrenal glands. No renal or ureteral obstructing stone or hydronephrosis. Small cysts right lower pole. Noncontrast filled imaging of the urinary bladder without gross abnormality. Stomach/Bowel: Small hiatal hernia. Under distended stomach without gross abnormality. Minimal haziness of fat planes suggestive of third spacing of fluid giving subcutaneous appearance rather than related to primary bowel inflammatory process. No free air or drainable abscess. Moderate stool rectosigmoid region. Vascular/Lymphatic: Atherosclerotic changes aorta with ectasia without aneurysmal dilation. Atherosclerotic changes iliac arteries with slight ectasia and narrowing. No obvious thrombosis of the iliac veins. No adenopathy. Reproductive: Endometrial lining measures 3 mm. If the patient had any vaginal bleeding than this can be assessed with pelvic sonogram. Tiny left ovarian cysts. Other: Mild third spacing of fluid. Musculoskeletal: Post left intertrochanteric fixation with streak artifact. Scoliosis lumbar spine convex left with disc space narrowing greatest on the right at the L2-3 level and on the left at the L4-5 level. IMPRESSION: Mild third spacing of fluid without source of patient's fever identified on the current exam. Right middle lobe bronchiectasis. Subsegmental basilar atelectasis with trace left pleural effusion/pleural thickening. Small hemangioma posterior dome of the liver suspected. Aortic atherosclerosis. Endometrial lining thickness 3 mm and tiny left ovarian cyst possibly incidental findings. If there is any vaginal bleeding, pelvic sonogram may then be considered for further delineation. Scoliosis and degenerative  changes. Electronically Signed   By: Genia Del M.D.   On: 11/17/2016 06:38   [place] Lt Upper Extrem Ltd Soft Tissue Non Vascular  Result Date: 11/20/2016 CLINICAL DATA:  Patient admitted 11/16/2016 with fever and tachycardia. Possible myositis. Pain and swelling about the left elbow for 3 days. EXAM: ULTRASOUND LEFT UPPER EXTREMITY LIMITED TECHNIQUE: Ultrasound examination of the upper extremity soft tissues was performed in the area of clinical concern. COMPARISON:  NONE. FINDINGS: Scanning was directed toward the region of concern. There is subcutaneous edema. No focal fluid collection is  seen. No mass is identified. IMPRESSION: Subcutaneous edema without focal fluid collection. Electronically Signed   By: Inge Rise M.D.   On: 11/20/2016 08:40    ASSESSMENT/PLAN:  Generalized weakness -  for rehabilitation, PT and OT, for therapeutic strengthening exercises; fall precautions  SIRS - unclear etiology,she was empirically treated with IV Vancomycin and IV Zosyn. CT of abdomen and pelvis negative for any source of infection. Chest and abdomen x-ray is unremarkable. Panculture showed no growth. Infectious disease was consulted and thought that she had polymyalgia rheumatica since muscle weakness has improved after starting Prednisone. Antibiotics were discontinued.  She will have rehabilitation with PT and OT for therapeutic strengthening exercises.  PMR - was started on Prednisone and to follow-up with rheumatology; check BMP  Leukocytosis - wbc 23.5; hematology was consulted and thought that leukocytosis is  Reactive; check CBC  Hx of RLE DVT - continue Coumadin  Long-term use of anticoagulant - INR  2.2, therapeutic; continue Coumadin 1 mg 1 tab PO Q D; check INR on 11/26/16  Gingivitis -  continue Magic mouthwash  Fever blister - continue Valtrex X 7 days  Allergic rhinitis - continue Flonase 50 g into both nostrils daily     Goals of care:  Short-term  rehabilitation    [place] Deupree C. [place] - NP    [employer] [phone]

## 2016-11-24 ENCOUNTER — Non-Acute Institutional Stay (SKILLED_NURSING_FACILITY): Payer: Medicare Other | Admitting: Internal Medicine

## 2016-11-24 ENCOUNTER — Encounter: Payer: Self-pay | Admitting: Internal Medicine

## 2016-11-24 DIAGNOSIS — E876 Hypokalemia: Secondary | ICD-10-CM

## 2016-11-24 DIAGNOSIS — K0510 Chronic gingivitis, plaque induced: Secondary | ICD-10-CM

## 2016-11-24 DIAGNOSIS — D6859 Other primary thrombophilia: Secondary | ICD-10-CM | POA: Diagnosis not present

## 2016-11-24 DIAGNOSIS — M353 Polymyalgia rheumatica: Secondary | ICD-10-CM

## 2016-11-24 DIAGNOSIS — E871 Hypo-osmolality and hyponatremia: Secondary | ICD-10-CM | POA: Diagnosis not present

## 2016-11-24 DIAGNOSIS — R531 Weakness: Secondary | ICD-10-CM

## 2016-11-24 DIAGNOSIS — E440 Moderate protein-calorie malnutrition: Secondary | ICD-10-CM | POA: Diagnosis not present

## 2016-11-24 DIAGNOSIS — B001 Herpesviral vesicular dermatitis: Secondary | ICD-10-CM

## 2016-11-24 DIAGNOSIS — K5900 Constipation, unspecified: Secondary | ICD-10-CM

## 2016-11-24 DIAGNOSIS — D72829 Elevated white blood cell count, unspecified: Secondary | ICD-10-CM

## 2016-11-24 LAB — CBC AND DIFFERENTIAL
HCT: 37 % (ref 36–46)
Hemoglobin: 11.8 g/dL — AB (ref 12.0–16.0)
Platelets: 672 10*3/uL — AB (ref 150–399)
WBC: 22.9 10^3/mL

## 2016-11-24 LAB — BASIC METABOLIC PANEL
BUN: 36 mg/dL — AB (ref 4–21)
Creatinine: 0.9 mg/dL (ref 0.5–1.1)
Glucose: 101 mg/dL
POTASSIUM: 4.5 mmol/L (ref 3.4–5.3)
Sodium: 136 mmol/L — AB (ref 137–147)

## 2016-11-24 LAB — PARVOVIRUS B19 ANTIBODY, IGG AND IGM
PAROVIRUS B19 IGM ABS: 0.8 {index} (ref 0.0–0.8)
Parovirus B19 IgG Abs: 6.2 index — ABNORMAL HIGH (ref 0.0–0.8)

## 2016-11-24 NOTE — Progress Notes (Signed)
LOCATION: [place]  PCP: Pcp Not In System   Code Status: Full Code   Goals of care: Advanced Directive information Advanced Directives 11/16/2016  Does Patient Have a Medical Advance Directive? Yes  Type of Advance Directive [place]  Does patient want to make changes to medical advance directive? No - Patient declined  Copy of [place] in Chart? No - copy requested  Would patient like information on creating a medical advance directive? No - Patient declined  Pre-existing out of facility DNR order (yellow form or pink MOST form) -       Extended Emergency Contact Information Primary Emergency Contact: [facility] Address: [address] [place] [address] Johnnette Litter of [place] Phone: [phone] Relation: Spouse   Allergies  Allergen Reactions  . Other Other (See Comments)    Patient has an intolerance to Augmentin,Zithromax,Bioxin,Avalox, & Erythromycin. She can not take them unless they are needed for Bronchiectasis  . Codeine Other (See Comments)    Unknown reaction    Chief Complaint  Patient presents with  . New Admit To SNF    New AdmissioN Visit      HPI:  Patient is a 83 y.o. female seen today for short term rehabilitation post hospital admission from 11/16/16-11/21/16 with SIRS with unclear etiology. Her infectious workup was negative. There was concern for PMR with her weakness, decreased exercise tolerance and muscle ache along with elevated ESR and she was started on prednisone. She was placed on valcyclovir for fever blister and vitamin supplements for gingivitis. Of note, she was in the hospital prior to this with UTI. She has PMH of DVT lower extremity, HTN, broncheactasis, PVD among others. She is seen in her room today with her husband at bedside. She resides in [place] and is visiting her grand daughter in [place].   Review of Systems:  Constitutional: Negative for  fever, chills, diaphoresis. Feels weak and tired.  HENT: Negative for headache, congestion, nasal discharge, sore throat, difficulty swallowing. Positive for dry mouth and decrease in strength of her voice.  Eyes: Negative for blurred vision, double vision and discharge. Wears glasses. Respiratory: Negative for cough. Positive for shortness of breath with exertion.  Cardiovascular: Negative for chest pain, palpitations, leg swelling.  Gastrointestinal: Negative for heartburn, nausea, vomiting, abdominal pain, loss of appetite,. Last bowel movement was yesterday with regular stool. Genitourinary: Negative for dysuria and flank pain.  Musculoskeletal: Negative for back pain, fall in the facility. Positive for myalgia to right shoulder and calf muscles.  Skin: Negative for itching, rash.  Neurological: Positive for weakness to her legs and occasional dizziness. Psychiatric/Behavioral: Negative for depression   Past Medical History:  Diagnosis Date  . Aortic regurgitation 12/24/2013   moderate  . Bronchiectasis ([place])   . Bursitis of right shoulder   . Chronic anticoagulation   . DVT (deep venous thrombosis) ([place])    Lower extremity  . Essential hypertension   . 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 Hyponatremia   . Leukocytosis   . Mitral regurgitation 12/24/2013   mild  . Murmur   . SIRS (systemic inflammatory response syndrome) ([facility])    Past Surgical History:  Procedure Laterality Date  . INTRAMEDULLARY (IM) NAIL INTERTROCHANTERIC Left 12/24/2013   Procedure: INTRAMEDULLARY (IM) NAIL INTERTROCHANTRIC;  Surgeon: Mcarthur Rossetti, MD;  Location: [facility];  Service: Orthopedics;  Laterality:  Left;  . THYROIDECTOMY, PARTIAL    . TONSILLECTOMY     Social History:   reports that she has never smoked. She has never used smokeless tobacco. She reports that she does not drink alcohol or use drugs.  Family History  Problem Relation Age of  Onset  . Prostate cancer Father     Medications: Allergies as of 11/24/2016      Reactions   Other Other (See Comments)   Patient has an intolerance to Augmentin,Zithromax,Bioxin,Avalox, & Erythromycin. She can not take them unless they are needed for Bronchiectasis   Codeine Other (See Comments)   Unknown reaction      Medication List       Accurate as of 11/24/16  3:10 PM. Always use your most recent med list.          azelastine 0.1 % nasal spray Commonly known as:  ASTELIN Place 2 sprays into both nostrils at bedtime as needed for allergies. Use in each nostril as directed   calcium carbonate 750 MG chewable tablet Commonly known as:  TUMS EX Chew 1 tablet by mouth daily.   conjugated estrogens vaginal cream Commonly known as:  PREMARIN Place 1 Applicatorful vaginally 2 (two) times a week.   feeding supplement (PRO-STAT SUGAR FREE 64) Liqd Take 30 mLs by mouth 2 (two) times daily.   fluticasone 50 MCG/ACT nasal spray Commonly known as:  FLONASE Place into both nostrils daily.   folic acid 1 MG tablet Commonly known as:  FOLVITE Take 1 tablet (1 mg total) by mouth daily.   loperamide 2 MG capsule Commonly known as:  IMODIUM Take 1 capsule (2 mg total) by mouth as needed for diarrhea or loose stools.   magic mouthwash w/lidocaine Soln Take 10 mLs by mouth 4 (four) times daily.   NUTRITIONAL SUPPLEMENT Liqd Take 237 mLs by mouth 2 (two) times daily. MedPass   polyethylene glycol packet Commonly known as:  MIRALAX / GLYCOLAX Take 17 g by mouth daily as needed.   predniSONE 5 MG tablet Commonly known as:  DELTASONE Take 37m daily for 5days,Take 160mdaily for 2 weeks,Take 12.22m27maily for 2 weeks until seen by rheumatology.   saccharomyces boulardii 250 MG capsule Commonly known as:  FLORASTOR Take 1 capsule (250 mg total) by mouth 2 (two) times daily.   valACYclovir 500 MG tablet Commonly known as:  VALTREX Take 1 tablet (500 mg total) by mouth 2  (two) times daily.   Vitamin D (Ergocalciferol) 50000 units Caps capsule Commonly known as:  DRISDOL Take 50,000 Units by mouth every 30 (thirty) days.   warfarin 1 MG tablet Commonly known as:  COUMADIN Take 1 mg by mouth daily.   zolpidem 5 MG tablet Commonly known as:  AMBIEN Take 5 mg by mouth at bedtime as needed for sleep.       Immunizations: Immunization History  Administered Date(s) Administered  . PPD Test 11/21/2016     Physical Exam: Vitals:   11/24/16 1248  BP: 117/60  Pulse: 84  Resp: 20  Temp: 97.4 F (36.3 C)  TempSrc: Oral  SpO2: 92%  Weight: 132 lb (59.9 kg)  Height: 5' (1.524 m)   Body mass index is 25.78 kg/m.  General- elderly female, well built, in no acute distress Head- normocephalic, atraumatic Nose- no maxillary or frontal sinus tenderness, no nasal discharge Throat- moist mucus membrane, no oral thrush, fever blister to lower lip, no angular cheilosis Eyes- PERRLA, EOMI, no pallor, no icterus, no discharge,  normal conjunctiva, normal sclera Neck- no cervical lymphadenopathy Cardiovascular- normal s1,s2, no murmur Respiratory- bilateral clear to auscultation, no wheeze, no rhonchi, no crackles, no use of accessory muscles Abdomen- bowel sounds present, soft, non tender, no guarding or rigidity Musculoskeletal- able to move all 4 extremities, muscle tenderness to right shoulder and calf muscles, 1+ leg edema Neurological- alert and oriented to person, place and time Skin- warm and dry, chronic venous ulcer to RLE healing well Psychiatry- normal mood and affect    Labs reviewed: Basic Metabolic Panel:  Recent Labs  11/16/16 1820 11/17/16 0450 11/18/16 0425  NA 132* 132* 133*  K 4.1 3.2* 3.4*  CL 93* 99* 98*  CO2 26 25 26   GLUCOSE 133* 117* 118*  BUN 18 12 11   CREATININE 0.95 0.80 0.86  CALCIUM 8.3* 7.1* 7.5*  MG  --  1.7  --   PHOS  --  3.0  --    Liver Function Tests:  Recent Labs  11/16/16 1820 11/17/16 0450  11/18/16 0425  AST 36 44* 48*  ALT 26 27 32  ALKPHOS 120 118 120  BILITOT 0.6 0.8 0.8  PROT 6.1* 4.9* 5.0*  ALBUMIN 2.2* 1.6* 1.6*    Recent Labs  11/01/16 0740  LIPASE 20   No results for input(s): AMMONIA in the last 8760 hours. CBC:  Recent Labs  11/19/16 0612 11/20/16 0516 11/21/16 0422 11/24/16  WBC 26.0* 27.6* 23.5* 22.9  NEUTROABS 21.3* 22.4* 19.8*  --   HGB 10.8* 11.3* 10.0* 11.8*  HCT 32.5* 35.0* 30.8* 37  MCV 81.5 83.1 83.2  --   PLT 549* 538* 472* 672*   Cardiac Enzymes:  Recent Labs  11/01/16 0740 11/02/16 0237 11/16/16 2341 11/17/16 0450 11/17/16 1133  CKTOTAL 24* 31* 20*  --   --   CKMB  --  2.9  --   --   --   TROPONINI <0.03  --  0.06* 0.03* 0.04*   BNP: Invalid input(s): POCBNP CBG: No results for input(s): GLUCAP in the last 8760 hours.  Radiological Exams: Dg Chest 2 View  Result Date: 11/16/2016 CLINICAL DATA:  83 year old female with history of shortness of breath for the past 2 weeks, worsening on exertion. History bronchiectasis. EXAM: CHEST  2 VIEW COMPARISON:  Chest x-ray 11/01/2016. FINDINGS: No consolidative airspace disease. Small left pleural effusion blunting the left costophrenic sulcus. No right pleural effusion. No evidence of pulmonary edema. No definite suspicious appearing pulmonary nodules or masses. Heart size is normal. The patient is rotated to the left on today's exam, resulting in distortion of the mediastinal contours and reduced diagnostic sensitivity and specificity for mediastinal pathology. Atherosclerosis in the thoracic aorta. Bilateral apical pleuroparenchymal thickening, partially calcified, most compatible with chronic post infectious or inflammatory scarring. IMPRESSION: 1. Small left pleural effusion. 2. Aortic atherosclerosis. Electronically Signed   By: Vinnie Langton M.D.   On: 11/16/2016 20:21   Dg Chest 2 View  Result Date: 11/01/2016 CLINICAL DATA:  Generalized body aches. EXAM: CHEST  2 VIEW  COMPARISON:  Chest x-ray 01/09/2014 FINDINGS: The heart size is upper limits of normal. Atherosclerotic changes are present at the aortic arch. Changes COPD are again seen. There is no edema or effusion. No focal airspace disease is present. IMPRESSION: 1. No acute cardiopulmonary disease. 2. Aortic atherosclerosis. 3. Changes of COPD. Electronically Signed   By: San Morelle M.D.   On: 11/01/2016 09:07   Dg Abd 1 View  Result Date: 11/17/2016 CLINICAL DATA:  Constipation. EXAM: ABDOMEN -  1 VIEW COMPARISON:  CT 11/17/2016 FINDINGS: Oral contrast material noted in the right colon and transverse colon. Contrast also noted within the urinary bladder from prior IV contrast administration. No evidence of bowel obstruction or free air. No organomegaly. No acute bony abnormality appear IMPRESSION: No acute findings. Electronically Signed   By: Rolm Baptise M.D.   On: 11/17/2016 07:59   Mr Brain Wo Contrast  Result Date: 11/01/2016 CLINICAL DATA:  Weakness EXAM: MRI HEAD WITHOUT CONTRAST TECHNIQUE: Multiplanar, multiecho pulse sequences of the brain and surrounding structures were obtained without intravenous contrast. COMPARISON:  None. FINDINGS: Brain: Moderate atrophy. Ventricular enlargement consistent with atrophy. Negative for acute infarct. Hyperintensity in the cerebral white matter bilaterally. Hyperintensity in the left thalamus. These areas are most likely due to chronic ischemia. Negative for hemorrhage or mass. No shift of the midline structures. Vascular: Normal arterial flow voids. Skull and upper cervical spine: Negative Sinuses/Orbits: Paranasal sinuses clear. Bilateral mastoid effusion. Normal orbit. Other: None IMPRESSION: Atrophy and moderate chronic ischemic change.  No acute abnormality Bilateral mastoid sinus effusion. Electronically Signed   By: Franchot Gallo M.D.   On: 11/01/2016 18:48   Ct Abdomen Pelvis W Contrast  Result Date: 11/17/2016 CLINICAL DATA:  83 year old  hypertensive female with systemic inflammatory response. History deep venous thrombosis. Recent urinary tract infection. Leukocytosis. Fever. Initial encounter. EXAM: CT ABDOMEN AND PELVIS WITH CONTRAST TECHNIQUE: Multidetector CT imaging of the abdomen and pelvis was performed using the standard protocol following bolus administration of intravenous contrast. CONTRAST:  100 cc Isovue 300. COMPARISON:  No comparison CT. FINDINGS: Lower chest: Bronchiectasis lower right middle lobe. Basilar subsegmental atelectasis. Trace left pleural effusion/ pleural thickening. Heart slightly enlarged.  Mitral valve calcifications. Hepatobiliary: 2.1 cm cyst dome of liver. Posterior dome liver 1.7 cm hemangioma suspected. No calcified gallstone. Pancreas: No pancreatic mass. Spleen: No mass or enlargement. Adrenals/Urinary Tract: Mild hyperplasia adrenal glands. No renal or ureteral obstructing stone or hydronephrosis. Small cysts right lower pole. Noncontrast filled imaging of the urinary bladder without gross abnormality. Stomach/Bowel: Small hiatal hernia. Under distended stomach without gross abnormality. Minimal haziness of fat planes suggestive of third spacing of fluid giving subcutaneous appearance rather than related to primary bowel inflammatory process. No free air or drainable abscess. Moderate stool rectosigmoid region. Vascular/Lymphatic: Atherosclerotic changes aorta with ectasia without aneurysmal dilation. Atherosclerotic changes iliac arteries with slight ectasia and narrowing. No obvious thrombosis of the iliac veins. No adenopathy. Reproductive: Endometrial lining measures 3 mm. If the patient had any vaginal bleeding than this can be assessed with pelvic sonogram. Tiny left ovarian cysts. Other: Mild third spacing of fluid. Musculoskeletal: Post left intertrochanteric fixation with streak artifact. Scoliosis lumbar spine convex left with disc space narrowing greatest on the right at the L2-3 level and on the  left at the L4-5 level. IMPRESSION: Mild third spacing of fluid without source of patient's fever identified on the current exam. Right middle lobe bronchiectasis. Subsegmental basilar atelectasis with trace left pleural effusion/pleural thickening. Small hemangioma posterior dome of the liver suspected. Aortic atherosclerosis. Endometrial lining thickness 3 mm and tiny left ovarian cyst possibly incidental findings. If there is any vaginal bleeding, pelvic sonogram may then be considered for further delineation. Scoliosis and degenerative changes. Electronically Signed   By: Genia Del M.D.   On: 11/17/2016 06:38   [place] Lt Upper Extrem Ltd Soft Tissue Non Vascular  Result Date: 11/20/2016 CLINICAL DATA:  Patient admitted 11/16/2016 with fever and tachycardia. Possible myositis. Pain and swelling about the  left elbow for 3 days. EXAM: ULTRASOUND LEFT UPPER EXTREMITY LIMITED TECHNIQUE: Ultrasound examination of the upper extremity soft tissues was performed in the area of clinical concern. COMPARISON:  NONE. FINDINGS: Scanning was directed toward the region of concern. There is subcutaneous edema. No focal fluid collection is seen. No mass is identified. IMPRESSION: Subcutaneous edema without focal fluid collection. Electronically Signed   By: Inge Rise M.D.   On: 11/20/2016 08:40    Assessment/Plan  Generalized weakness Will have her work with physical therapy and occupational therapy team to help with gait training and muscle strengthening exercises.fall precautions. Skin care. Encourage to be out of bed.   PMR Elevated ESR. Continue prednisone 20 mg daily until 11/26/16, then 15 mg daily x 2 weeks and then 12.5 mg daily x 2 weeks until seen by rheumatology. Will have patient work with PT/OT as tolerated to regain strength and restore function.  Fall precautions are in place.  Leukocytosis Afebrile. Has ongoing inflammation from PMR and isa also on steroid both of which can cause  leukocytosis. Monitor wbc curve  Fever blister Continue and complete valtrex 550 mg bid on 11/26/16  Hyponatremia Check bmp  Hypokalemia Check bmp  protein C deficiency Continue couamdin 1 mg daily and check inr 11/26/16  Gingivitis Continue mouth wash, provide oral hygiene  Protein calorie malnutrition Monitor weekly weight, continue prostat and medpass  Constipation Continue miralax 17 g daily    Goals of care: short term rehabilitation   Labs/tests ordered: CBC, BMP  Family/ staff Communication: reviewed care plan with patient and nursing supervisor    Blanchie Serve, MD Internal Medicine [place], [place] [address] Cell Phone (Monday-Friday 8 am - 5 pm): [phone] On Call: [phone] and follow prompts after 5 pm and on weekends Office Phone: [phone] Office Fax: [phone]

## 2016-11-25 ENCOUNTER — Non-Acute Institutional Stay (SKILLED_NURSING_FACILITY): Payer: Medicare Other | Admitting: Adult Health

## 2016-11-25 ENCOUNTER — Encounter: Payer: Self-pay | Admitting: Adult Health

## 2016-11-25 DIAGNOSIS — G4700 Insomnia, unspecified: Secondary | ICD-10-CM

## 2016-11-25 DIAGNOSIS — R531 Weakness: Secondary | ICD-10-CM

## 2016-11-25 DIAGNOSIS — M353 Polymyalgia rheumatica: Secondary | ICD-10-CM | POA: Diagnosis not present

## 2016-11-25 DIAGNOSIS — D72829 Elevated white blood cell count, unspecified: Secondary | ICD-10-CM

## 2016-11-25 DIAGNOSIS — K5901 Slow transit constipation: Secondary | ICD-10-CM

## 2016-11-25 DIAGNOSIS — B001 Herpesviral vesicular dermatitis: Secondary | ICD-10-CM | POA: Diagnosis not present

## 2016-11-25 DIAGNOSIS — J309 Allergic rhinitis, unspecified: Secondary | ICD-10-CM

## 2016-11-25 DIAGNOSIS — Z86718 Personal history of other venous thrombosis and embolism: Secondary | ICD-10-CM | POA: Diagnosis not present

## 2016-11-25 DIAGNOSIS — K0510 Chronic gingivitis, plaque induced: Secondary | ICD-10-CM | POA: Diagnosis not present

## 2016-11-25 LAB — RSV(RESPIRATORY SYNCYTIAL VIRUS) AB, BLOOD: RSV Ab: 1:32 {titer} — ABNORMAL HIGH

## 2016-11-25 NOTE — Progress Notes (Signed)
Patient ID: Vanessa Page, female   DOB: 22-Nov-1930, 83 y.o.   MRN: [id number]    DATE:  11/25/2016   MRN:  [id number]  BIRTHDAY: [birth year]  Facility:  Nursing Home Location:  [place] Room Number: 803-B  LEVEL OF CARE:  SNF (82)  Contact Information    Name Relation Home Work Mobile   [place] Spouse [phone]         Code Status History    Date Active Date Inactive Code Status Order ID Comments User Context   11/21/2016  2:01 PM 11/21/2016  7:15 PM Full Code [id number]  Lavina Hamman, MD Inpatient   11/17/2016 12:31 AM 11/21/2016  2:01 PM DNR [id number]  Toy Baker, MD Inpatient   11/01/2016  3:44 PM 11/03/2016  8:09 PM Full Code [id number]  Jani Gravel, MD Inpatient   12/26/2013  4:05 PM 01/05/2014  2:42 PM Full Code [id number]  Bary Leriche, PA-C Inpatient   12/24/2013 10:22 AM 12/26/2013  4:05 PM Full Code [id number]  Mcarthur Rossetti, MD Inpatient   12/23/2013  9:06 PM 12/24/2013 10:22 AM Full Code [id number]  Nat Math, MD Inpatient       Chief Complaint  Patient presents with  . Discharge Note    HISTORY OF PRESENT ILLNESS:   This is an 56-YO female who is for transfer to a skilled facility in [place]. She lives in [place] and was just visiting family members in [place], [place] when she got sick.  She was admitted to [facility] for short-term rehabilitation on 11/21/2016 following an admission at [facility] 11/16/2016-11/21/2016 for systemic inflammatory response syndrome (SIRS). CT abdomen unremarkable and panculture showed no growth. She was empirically treated with IV Vancomycin and IV Zosyn. She had persistent leukocytosis and elevated ESR of 70. Hematology was consulted and thought that leukocytosis is reactive and no further work-up needed. Lower extremity dopplers were negative for DVT and 2-D echo with EF 65-70%. Neurology was consulted for her myalgia of lower extremity and thought that she does  not have much evidence of myositis. She was started on Prednisone. She was given magic mouthwash for gingivitis. She was given Valtrex for fever blister. She has PMH of bronchiectasis, factor C def,  history of DVT lower extremities and hyponatremia.   PAST MEDICAL HISTORY:  Past Medical History:  Diagnosis Date  . Aortic regurgitation 12/24/2013   moderate  . Bronchiectasis ([place])   . Bursitis of right shoulder   . Chronic anticoagulation   . DVT (deep venous thrombosis) ([place])    Lower extremity  . Essential hypertension   . 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 Hyponatremia   . Leukocytosis   . Mitral regurgitation 12/24/2013   mild  . Murmur   . SIRS (systemic inflammatory response syndrome) ([facility])      CURRENT MEDICATIONS: Reviewed  Patient's Medications  New Prescriptions   No medications on file  Previous Medications   AMINO ACIDS-PROTEIN HYDROLYS (FEEDING SUPPLEMENT, PRO-STAT SUGAR FREE 64,) LIQD    Take 30 mLs by mouth 2 (two) times daily.   AZELASTINE (ASTELIN) 137 MCG/SPRAY NASAL SPRAY    Place 2 sprays into both nostrils at bedtime as needed for allergies. Use in each nostril as directed   CALCIUM CARBONATE (TUMS EX) 750 MG CHEWABLE TABLET    Chew 1 tablet by mouth daily.   CONJUGATED ESTROGENS (PREMARIN) VAGINAL CREAM    Place 1 Applicatorful vaginally  2 (two) times a week.   FLUTICASONE (FLONASE) 50 MCG/ACT NASAL SPRAY    Place into both nostrils daily.   FOLIC ACID (FOLVITE) 1 MG TABLET    Take 1 tablet (1 mg total) by mouth daily.   LOPERAMIDE (IMODIUM) 2 MG CAPSULE    Take 1 capsule (2 mg total) by mouth as needed for diarrhea or loose stools.   MAGIC MOUTHWASH W/LIDOCAINE SOLN    Take 10 mLs by mouth 4 (four) times daily.   NUTRITIONAL SUPPLEMENT LIQD    Take 237 mLs by mouth 2 (two) times daily. [employer]   POLYETHYLENE GLYCOL (MIRALAX / GLYCOLAX) PACKET    Take 17 g by mouth daily as needed.   PREDNISONE (DELTASONE) 5 MG  TABLET    Take 58m daily for 5days,Take 152mdaily for 2 weeks,Take 12.44m67maily for 2 weeks until seen by rheumatology.   SACCHAROMYCES BOULARDII (FLORASTOR) 250 MG CAPSULE    Take 1 capsule (250 mg total) by mouth 2 (two) times daily.   VALACYCLOVIR (VALTREX) 500 MG TABLET    Take 1 tablet (500 mg total) by mouth 2 (two) times daily.   VITAMIN D, ERGOCALCIFEROL, (DRISDOL) 50000 UNITS CAPS CAPSULE    Take 50,000 Units by mouth every 30 (thirty) days.   WARFARIN (COUMADIN) 1 MG TABLET    Take 1 mg by mouth daily.    ZOLPIDEM (AMBIEN) 5 MG TABLET    Take 5 mg by mouth at bedtime as needed for sleep.  Modified Medications   No medications on file  Discontinued Medications   No medications on file     Allergies  Allergen Reactions  . Other Other (See Comments)    Patient has an intolerance to Augmentin,Zithromax,Bioxin,Avalox, & Erythromycin. She can not take them unless they are needed for Bronchiectasis  . Codeine Other (See Comments)    Unknown reaction     REVIEW OF SYSTEMS:  GENERAL: no change in appetite, no fatigue, no weight changes, no fever, chills or weakness EYES: Denies change in vision, dry eyes, eye pain, itching or discharge EARS: Denies change in hearing, ringing in ears, or earache NOSE: Denies nasal congestion or epistaxis MOUTH and THROAT: Denies oral discomfort, gingival pain or bleeding, pain from teeth or hoarseness   RESPIRATORY: no cough, SOB, DOE, wheezing, hemoptysis CARDIAC: no chest pain, or palpitations GI: no abdominal pain, diarrhea, constipation, heart burn, nausea or vomiting GU: Denies dysuria, frequency, hematuria, incontinence, or discharge PSYCHIATRIC: Denies feeling of depression or anxiety. No report of hallucinations, insomnia, paranoia, or agitation    PHYSICAL EXAMINATION  GENERAL APPEARANCE: Well nourished. In no acute distress. Normal body habitus SKIN:  Has dry blister on her chin HEAD: Normal in size and contour. No evidence of  trauma EYES: Lids open and close normally. No blepharitis, entropion or ectropion. PERRL. Conjunctivae are clear and sclerae are white. Lenses are without opacity EARS: Pinnae are normal. Patient hears normal voice tunes of the examiner MOUTH and THROAT: Lips are without lesions. Oral mucosa is moist and without lesions. Tongue is normal in shape, size, and color and without lesions NECK: supple, trachea midline, no neck masses, no thyroid tenderness, no thyromegaly LYMPHATICS: no LAN in the neck, no supraclavicular LAN RESPIRATORY: breathing is even & unlabored, BS CTAB CARDIAC: Irregularly irregular, no murmur,no extra heart sounds, BLE 2+ edema GI: abdomen soft, normal BS, no masses, no tenderness, no hepatomegaly, no splenomegaly EXTREMITIES:  Able to move X 4 extremities PSYCHIATRIC: Alert and oriented X 3. Affect  and behavior are appropriate   LABS/RADIOLOGY: Labs reviewed: Basic Metabolic Panel:  Recent Labs  11/16/16 1820 11/17/16 0450 11/18/16 0425 11/24/16  NA 132* 132* 133* 136*  K 4.1 3.2* 3.4* 4.5  CL 93* 99* 98*  --   CO2 _0 --   GLUCOSE 133* 117* 118*  --   BUN _1 36*  CREATININE 0.95 0.80 0.86 0.9  CALCIUM 8.3* 7.1* 7.5*  --   MG  --  1.7  --   --   PHOS  --  3.0  --   --    Liver Function Tests:  Recent Labs  11/16/16 1820 11/17/16 0450 11/18/16 0425  AST 36 44* 48*  ALT 26 27 32  ALKPHOS 120 118 120  BILITOT 0.6 0.8 0.8  PROT 6.1* 4.9* 5.0*  ALBUMIN 2.2* 1.6* 1.6*    Recent Labs  11/01/16 0740  LIPASE 20   CBC:  Recent Labs  11/19/16 0612 11/20/16 0516 11/21/16 0422 11/24/16  WBC 26.0* 27.6* 23.5* 22.9  NEUTROABS 21.3* 22.4* 19.8*  --   HGB 10.8* 11.3* 10.0* 11.8*  HCT 32.5* 35.0* 30.8* 37  MCV 81.5 83.1 83.2  --   PLT 549* 538* 472* 672*   Cardiac Enzymes:  Recent Labs  11/01/16 0740 11/02/16 0237 11/16/16 2341 11/17/16 0450 11/17/16 1133  CKTOTAL 24* 31* 20*  --   --   CKMB  --  2.9  --   --   --    TROPONINI <0.03  --  0.06* 0.03* 0.04*    Dg Chest 2 View  Result Date: 11/16/2016 CLINICAL DATA:  83 year old female with history of shortness of breath for the past 2 weeks, worsening on exertion. History bronchiectasis. EXAM: CHEST  2 VIEW COMPARISON:  Chest x-ray 11/01/2016. FINDINGS: No consolidative airspace disease. Small left pleural effusion blunting the left costophrenic sulcus. No right pleural effusion. No evidence of pulmonary edema. No definite suspicious appearing pulmonary nodules or masses. Heart size is normal. The patient is rotated to the left on today's exam, resulting in distortion of the mediastinal contours and reduced diagnostic sensitivity and specificity for mediastinal pathology. Atherosclerosis in the thoracic aorta. Bilateral apical pleuroparenchymal thickening, partially calcified, most compatible with chronic post infectious or inflammatory scarring. IMPRESSION: 1. Small left pleural effusion. 2. Aortic atherosclerosis. Electronically Signed   By: Vinnie Langton M.D.   On: 11/16/2016 20:21   Dg Chest 2 View  Result Date: 11/01/2016 CLINICAL DATA:  Generalized body aches. EXAM: CHEST  2 VIEW COMPARISON:  Chest x-ray 01/09/2014 FINDINGS: The heart size is upper limits of normal. Atherosclerotic changes are present at the aortic arch. Changes COPD are again seen. There is no edema or effusion. No focal airspace disease is present. IMPRESSION: 1. No acute cardiopulmonary disease. 2. Aortic atherosclerosis. 3. Changes of COPD. Electronically Signed   By: San Morelle M.D.   On: 11/01/2016 09:07   Dg Abd 1 View  Result Date: 11/17/2016 CLINICAL DATA:  Constipation. EXAM: ABDOMEN - 1 VIEW COMPARISON:  CT 11/17/2016 FINDINGS: Oral contrast material noted in the right colon and transverse colon. Contrast also noted within the urinary bladder from prior IV contrast administration. No evidence of bowel obstruction or free air. No organomegaly. No acute bony abnormality  appear IMPRESSION: No acute findings. Electronically Signed   By: Rolm Baptise M.D.   On: 11/17/2016 07:59   Mr Brain Wo Contrast  Result Date: 11/01/2016 CLINICAL DATA:  Weakness EXAM: MRI HEAD WITHOUT CONTRAST  TECHNIQUE: Multiplanar, multiecho pulse sequences of the brain and surrounding structures were obtained without intravenous contrast. COMPARISON:  None. FINDINGS: Brain: Moderate atrophy. Ventricular enlargement consistent with atrophy. Negative for acute infarct. Hyperintensity in the cerebral white matter bilaterally. Hyperintensity in the left thalamus. These areas are most likely due to chronic ischemia. Negative for hemorrhage or mass. No shift of the midline structures. Vascular: Normal arterial flow voids. Skull and upper cervical spine: Negative Sinuses/Orbits: Paranasal sinuses clear. Bilateral mastoid effusion. Normal orbit. Other: None IMPRESSION: Atrophy and moderate chronic ischemic change.  No acute abnormality Bilateral mastoid sinus effusion. Electronically Signed   By: Franchot Gallo M.D.   On: 11/01/2016 18:48   Ct Abdomen Pelvis W Contrast  Result Date: 11/17/2016 CLINICAL DATA:  83 year old hypertensive female with systemic inflammatory response. History deep venous thrombosis. Recent urinary tract infection. Leukocytosis. Fever. Initial encounter. EXAM: CT ABDOMEN AND PELVIS WITH CONTRAST TECHNIQUE: Multidetector CT imaging of the abdomen and pelvis was performed using the standard protocol following bolus administration of intravenous contrast. CONTRAST:  100 cc Isovue 300. COMPARISON:  No comparison CT. FINDINGS: Lower chest: Bronchiectasis lower right middle lobe. Basilar subsegmental atelectasis. Trace left pleural effusion/ pleural thickening. Heart slightly enlarged.  Mitral valve calcifications. Hepatobiliary: 2.1 cm cyst dome of liver. Posterior dome liver 1.7 cm hemangioma suspected. No calcified gallstone. Pancreas: No pancreatic mass. Spleen: No mass or enlargement.  Adrenals/Urinary Tract: Mild hyperplasia adrenal glands. No renal or ureteral obstructing stone or hydronephrosis. Small cysts right lower pole. Noncontrast filled imaging of the urinary bladder without gross abnormality. Stomach/Bowel: Small hiatal hernia. Under distended stomach without gross abnormality. Minimal haziness of fat planes suggestive of third spacing of fluid giving subcutaneous appearance rather than related to primary bowel inflammatory process. No free air or drainable abscess. Moderate stool rectosigmoid region. Vascular/Lymphatic: Atherosclerotic changes aorta with ectasia without aneurysmal dilation. Atherosclerotic changes iliac arteries with slight ectasia and narrowing. No obvious thrombosis of the iliac veins. No adenopathy. Reproductive: Endometrial lining measures 3 mm. If the patient had any vaginal bleeding than this can be assessed with pelvic sonogram. Tiny left ovarian cysts. Other: Mild third spacing of fluid. Musculoskeletal: Post left intertrochanteric fixation with streak artifact. Scoliosis lumbar spine convex left with disc space narrowing greatest on the right at the L2-3 level and on the left at the L4-5 level. IMPRESSION: Mild third spacing of fluid without source of patient's fever identified on the current exam. Right middle lobe bronchiectasis. Subsegmental basilar atelectasis with trace left pleural effusion/pleural thickening. Small hemangioma posterior dome of the liver suspected. Aortic atherosclerosis. Endometrial lining thickness 3 mm and tiny left ovarian cyst possibly incidental findings. If there is any vaginal bleeding, pelvic sonogram may then be considered for further delineation. Scoliosis and degenerative changes. Electronically Signed   By: Genia Del M.D.   On: 11/17/2016 06:38   [place] Lt Upper Extrem Ltd Soft Tissue Non Vascular  Result Date: 11/20/2016 CLINICAL DATA:  Patient admitted 11/16/2016 with fever and tachycardia. Possible myositis. Pain and  swelling about the left elbow for 3 days. EXAM: ULTRASOUND LEFT UPPER EXTREMITY LIMITED TECHNIQUE: Ultrasound examination of the upper extremity soft tissues was performed in the area of clinical concern. COMPARISON:  NONE. FINDINGS: Scanning was directed toward the region of concern. There is subcutaneous edema. No focal fluid collection is seen. No mass is identified. IMPRESSION: Subcutaneous edema without focal fluid collection. Electronically Signed   By: Inge Rise M.D.   On: 11/20/2016 08:40    ASSESSMENT/PLAN:  Generalized weakness -  will transfer to a skilled facility in [place], fall precautions  PMR - was started on Prednisone and to follow-up with rheumatology in [place]  Leukocytosis - wbc 22.9; thought to be  reactive; patient and husband will follow-up with PCP in [place]  Hx of RLE DVT - continue Coumadin 1 mg PO Q D; check INR on 09/28/16  Gingivitis -  continue Magic mouthwash 10 ml QID  Fever blister - continue Valtrex X 1 more day then DC  Allergic rhinitis - continue Flonase 50 g into both nostrils daily  Insomnia - continue Ambien 5 mg 1 tab by mouth daily at bedtime when necessary   Constipation - continue MiraLAX 17 g by mouth daily PRN     I have filled out patient's discharge paperwork and written prescriptions.  Patient will transfer to a skilled facility in [place].  DME provided:  None  Total discharge time: Less than 30 minutes  Discharge time involved coordination of the discharge process with social worker, nursing staff and therapy department.     Zayana Salvador C. [place] - NP    [employer] [phone]

## 2017-04-20 IMAGING — US US EXTREM UP*L* LTD
1 series · 14 of 23 positions shown · non-contrast
Comparison: NONE.

CLINICAL DATA: Patient admitted 11/16/2016 with fever and
tachycardia. Possible myositis. Pain and swelling about the left
elbow for 3 days.

EXAM:
ULTRASOUND LEFT UPPER EXTREMITY LIMITED
TECHNIQUE: Ultrasound examination of the upper extremity soft tissues was
performed in the area of clinical concern.

[Series 1: us extrem up*left* ltd · 0.06mm/px · 14 of 23 slices shown]
[im 1/23]
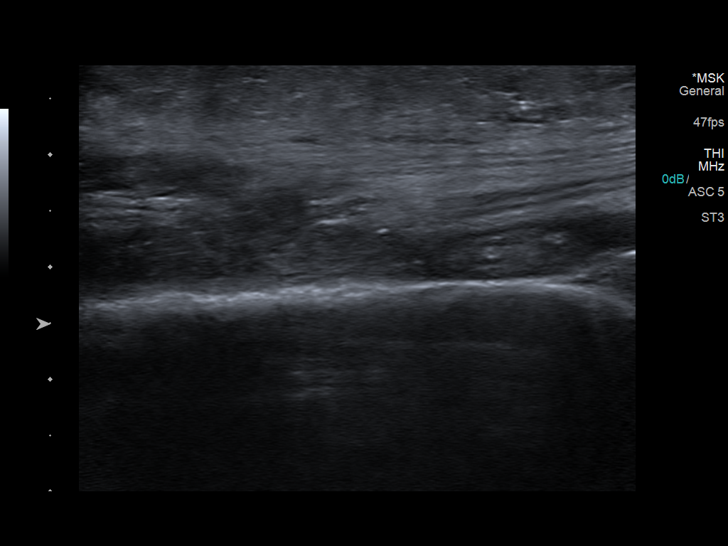
[im 3/23]
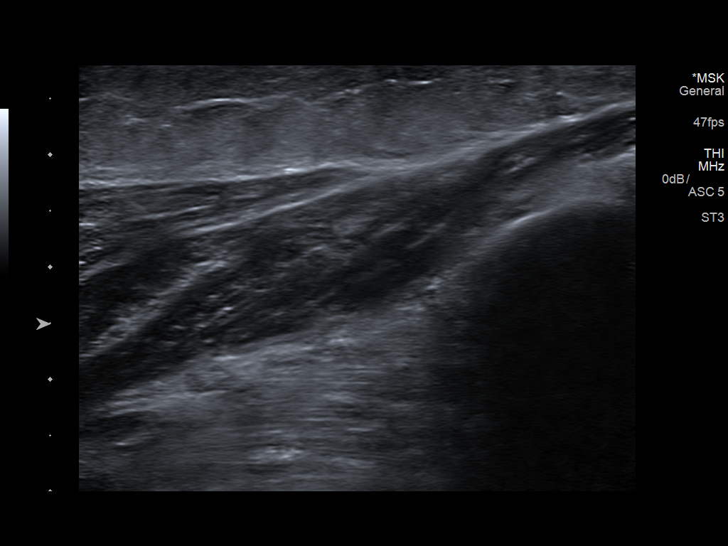
[im 5/23]
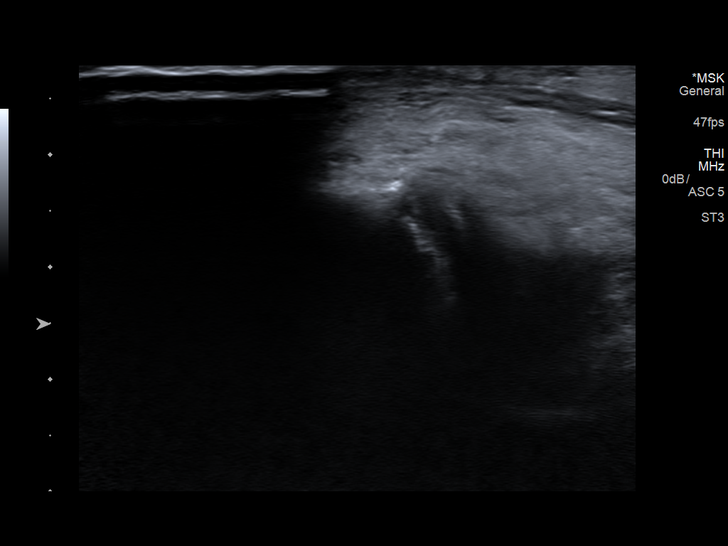
[im 6/23]
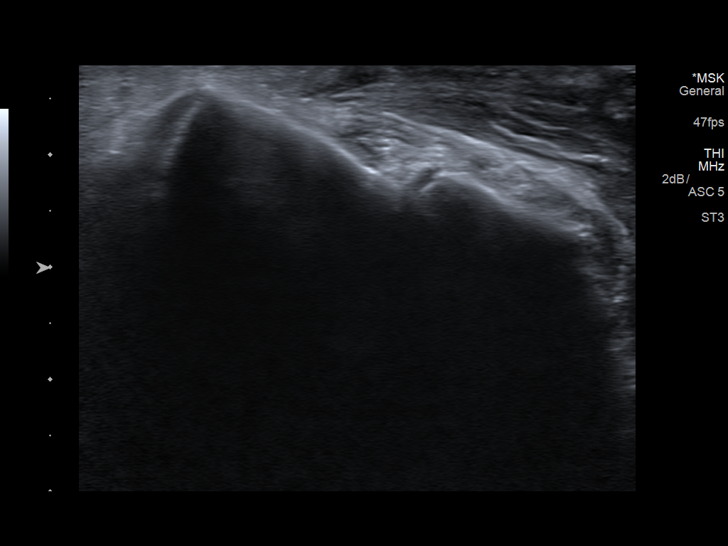
[im 8/23]
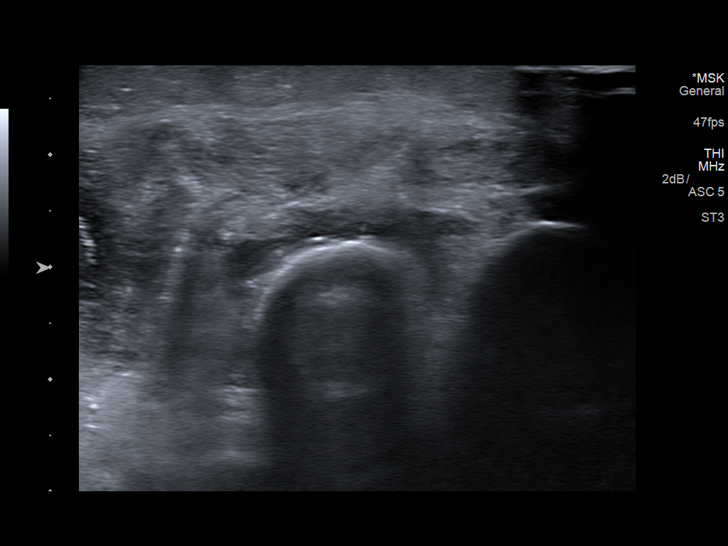
[im 10/23]
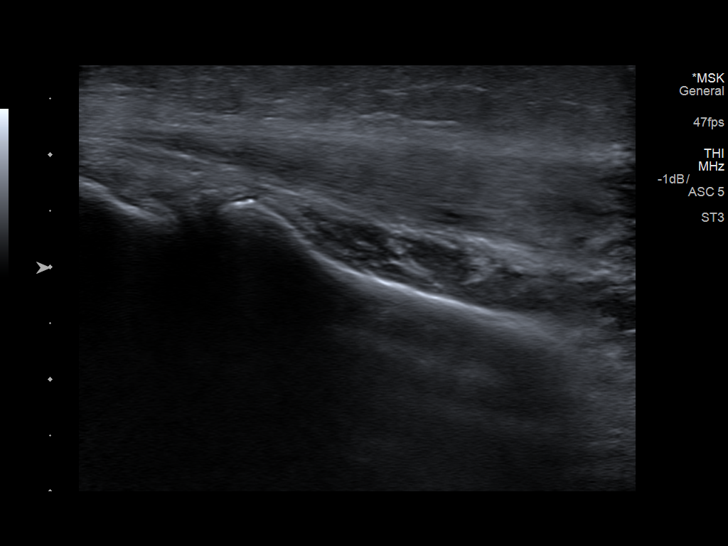
[im 11/23]
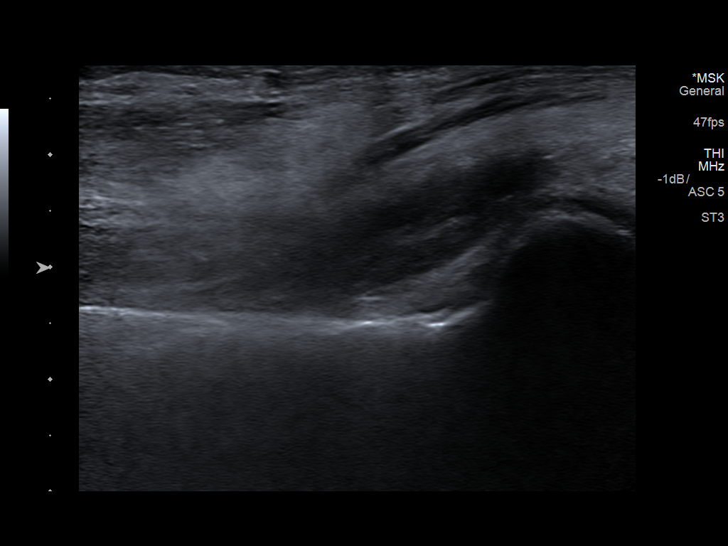
[im 13/23]
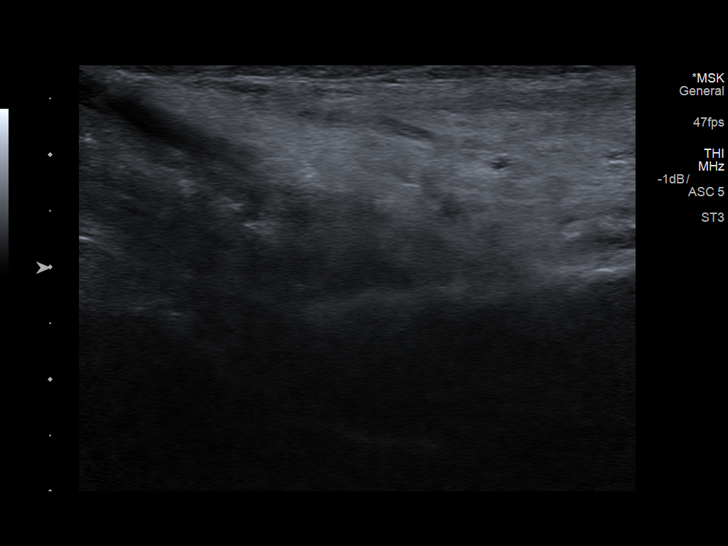
[im 14/23]
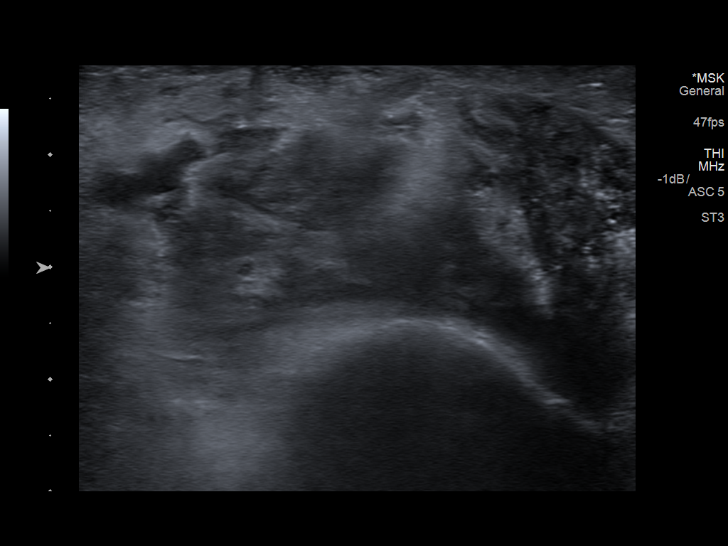
[im 16/23]
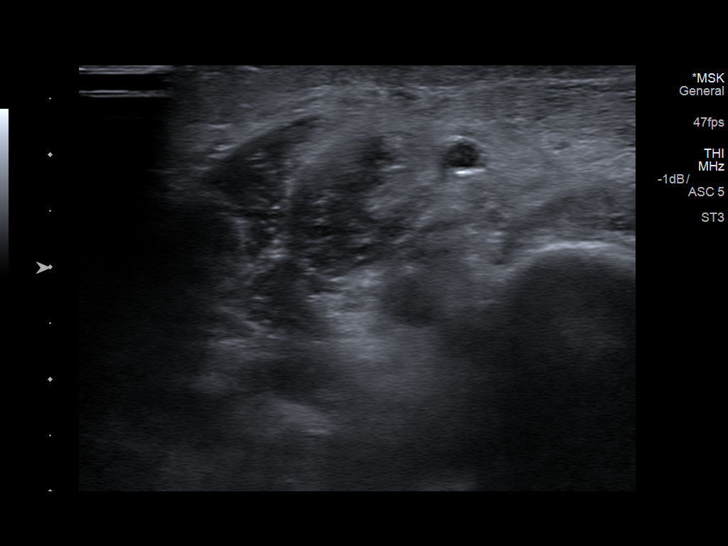
[im 18/23]
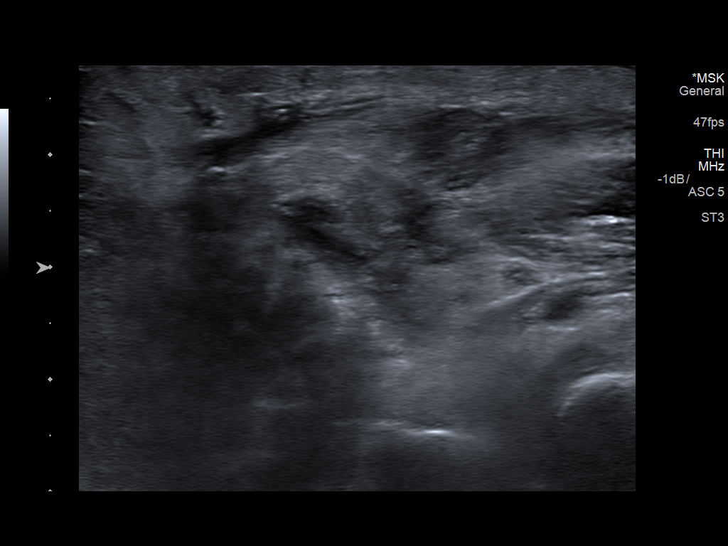
[im 19/23]
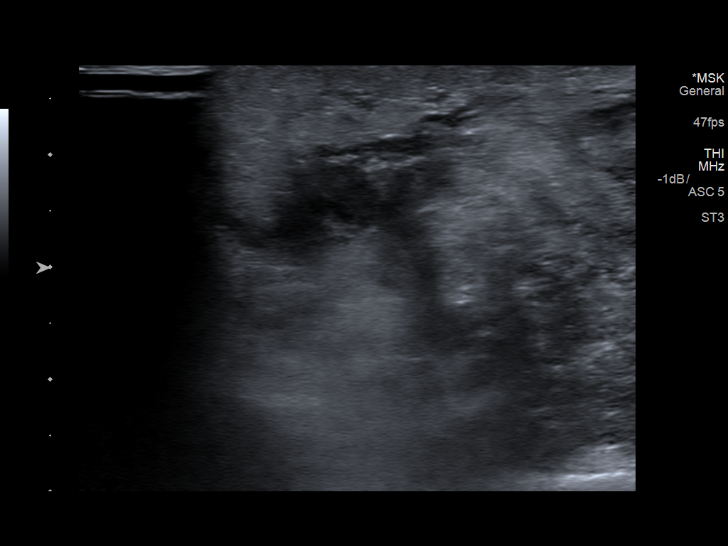
[im 21/23]
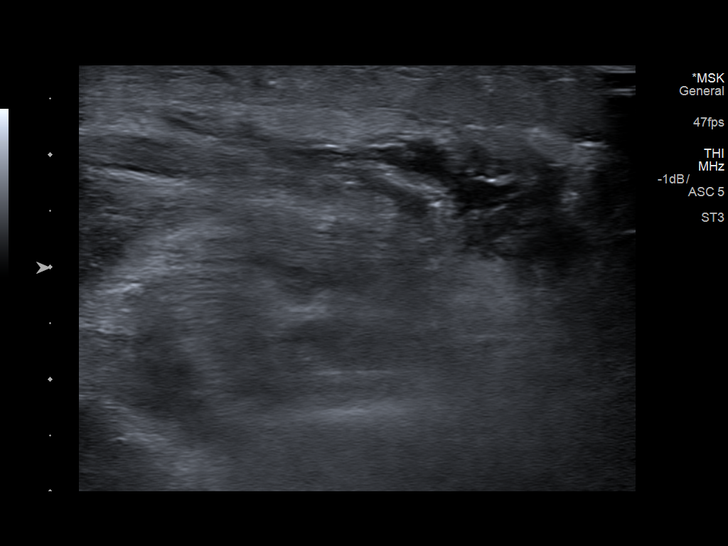
[im 23/23]
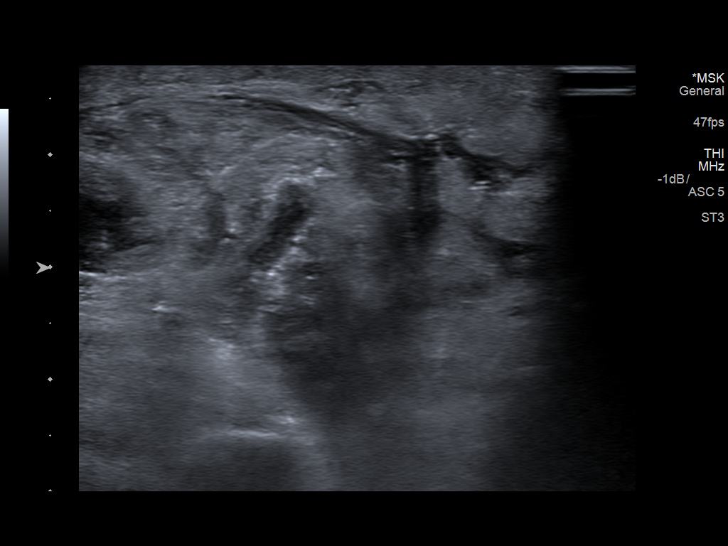

[14 of 23 positions shown; findings below may reference images not displayed]

FINDINGS: Scanning was directed toward the region of concern. There is
subcutaneous edema. No focal fluid collection is seen. No mass is
identified.
IMPRESSION: Subcutaneous edema without focal fluid collection.

## 2017-07-18 IMAGING — DX DG ABDOMEN 1V
1 series · 1 of 1 positions shown · non-contrast
Comparison: CT 11/17/2016

CLINICAL DATA: Constipation.

EXAM:
ABDOMEN - 1 VIEW

[abdomen kub]
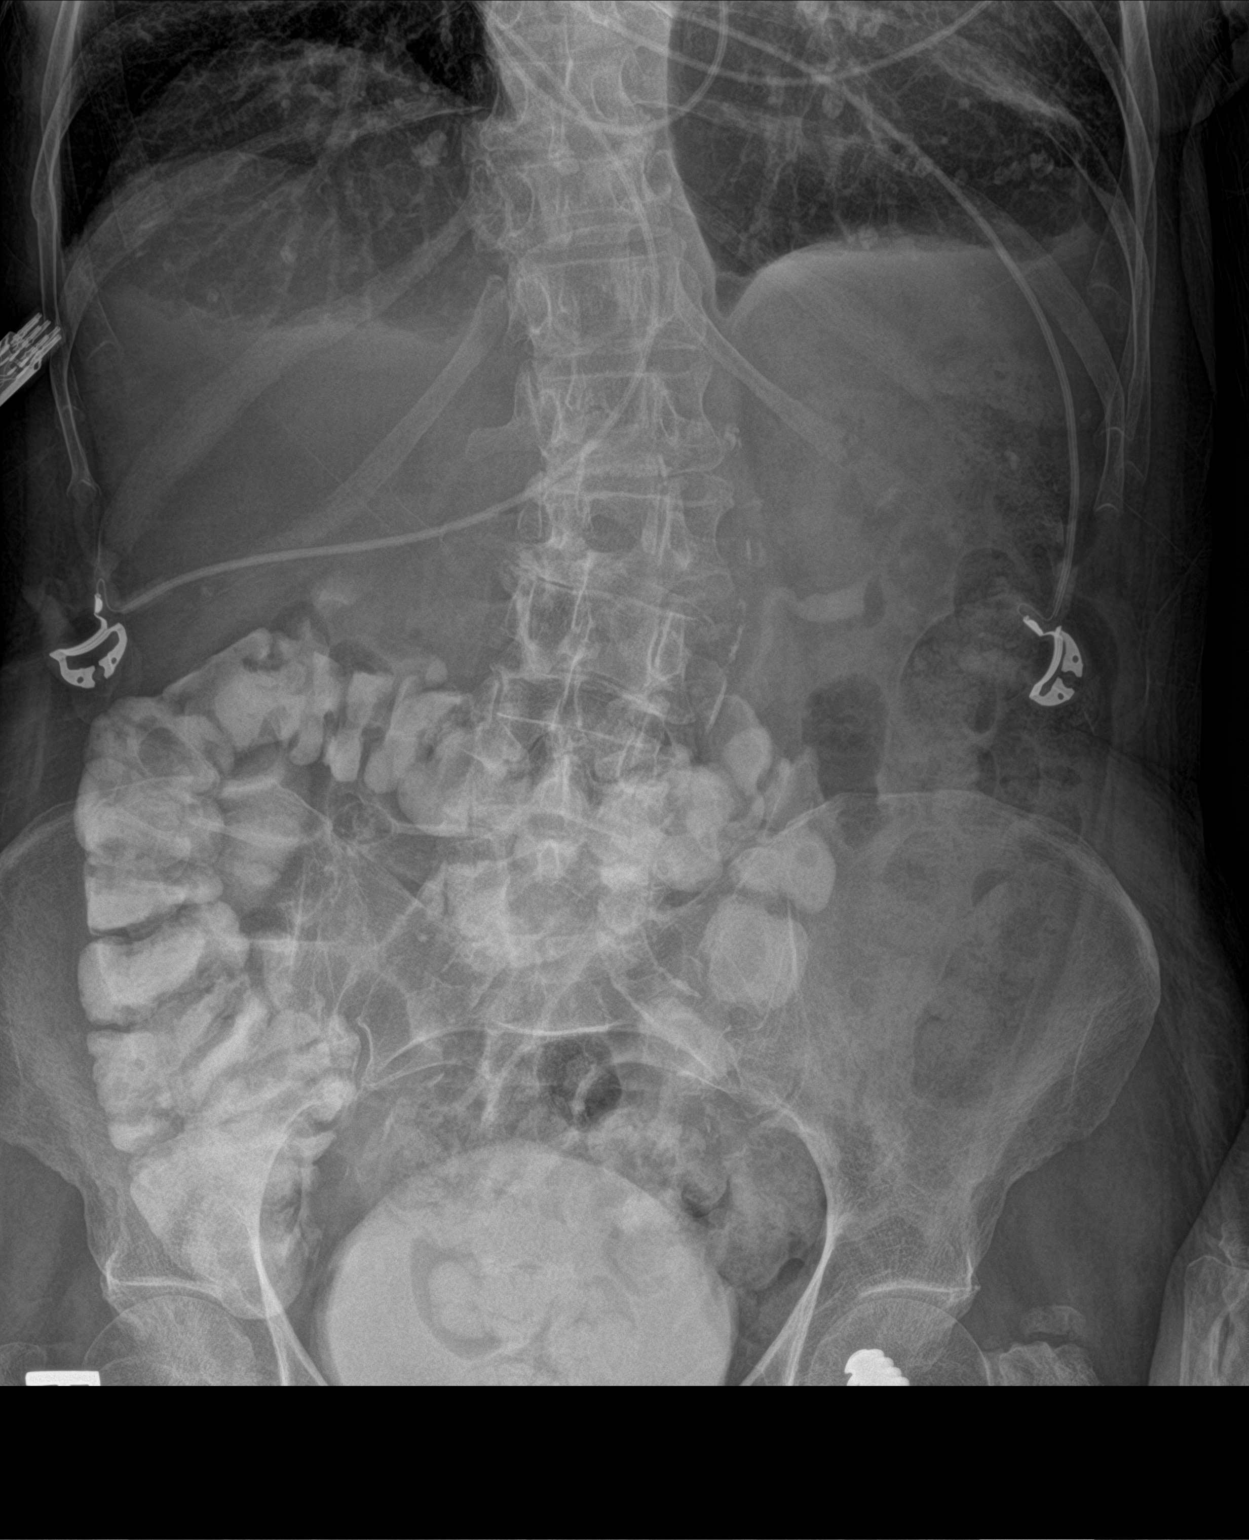

[1 of 1 positions shown; findings below may reference images not displayed]

FINDINGS: Oral contrast material noted in the right colon and transverse
colon. Contrast also noted within the urinary bladder from prior IV
contrast administration. No evidence of bowel obstruction or free
air. No organomegaly. No acute bony abnormality appear
IMPRESSION: No acute findings.
